# Patient Record
Sex: Female | Born: 1960 | Race: White | Hispanic: No | Marital: Married
Health system: Midwestern US, Academic
[De-identification: ages and names within clinical notes are randomized; demographics above are authoritative.]

---

## 2016-08-27 ENCOUNTER — Encounter: Admit: 2016-08-27 | Discharge: 2016-08-27 | Payer: BC Managed Care – HMO

## 2016-08-27 NOTE — Telephone Encounter
Patient called and LVM for call back.  I called her back and she asked about the need to come in for her update appointment. I advised it was just for her yearly update.  She had no other questions.

## 2016-08-31 ENCOUNTER — Encounter: Admit: 2016-08-31 | Discharge: 2016-08-31 | Payer: BC Managed Care – HMO

## 2016-09-03 ENCOUNTER — Encounter: Admit: 2016-09-03 | Discharge: 2016-09-03 | Payer: BC Managed Care – HMO

## 2016-09-03 NOTE — Telephone Encounter
Patient called and LVM for call back.  Called patient back and she asked advised that she had some Living donors and I advised of the LD number for them to call.

## 2016-09-09 ENCOUNTER — Encounter: Admit: 2016-09-09 | Discharge: 2016-09-09

## 2016-09-09 NOTE — Telephone Encounter
Patient called and LVM for call back.  Patient advised her son could not be a living donor and she was wanting to know if she was still on the list. I advised she was and that did not have any bearing on living donation. She had no other questions.

## 2016-10-28 ENCOUNTER — Ambulatory Visit: Admit: 2016-10-28 | Discharge: 2016-10-28 | Payer: BC Managed Care – HMO

## 2016-10-28 ENCOUNTER — Encounter: Admit: 2016-10-28 | Discharge: 2016-10-28 | Payer: BC Managed Care – HMO

## 2016-10-28 ENCOUNTER — Ambulatory Visit: Admit: 2016-10-28 | Discharge: 2016-10-28

## 2016-10-28 DIAGNOSIS — I10 Essential (primary) hypertension: Principal | ICD-10-CM

## 2016-10-28 DIAGNOSIS — N186 End stage renal disease: Principal | ICD-10-CM

## 2016-10-28 DIAGNOSIS — Z01818 Encounter for other preprocedural examination: ICD-10-CM

## 2016-10-28 DIAGNOSIS — E1129 Type 2 diabetes mellitus with other diabetic kidney complication: Secondary | ICD-10-CM

## 2016-10-28 DIAGNOSIS — E785 Hyperlipidemia, unspecified: ICD-10-CM

## 2016-10-28 DIAGNOSIS — Z114 Encounter for screening for human immunodeficiency virus [HIV]: ICD-10-CM

## 2016-10-28 DIAGNOSIS — R9439 Abnormal result of other cardiovascular function study: ICD-10-CM

## 2016-10-28 LAB — URINALYSIS DIPSTICK
Lab: NEGATIVE
Lab: NEGATIVE

## 2016-10-28 LAB — HEPATITIS C ANTIBODY W REFLEX HCV PCR QUANT: Lab: NEGATIVE U/L (ref 25–110)

## 2016-10-28 LAB — EPSTEIN BARR  PANEL(EBV)
Lab: NEGATIVE mg/dL — ABNORMAL HIGH (ref 0.4–1.00)
Lab: POSITIVE MMOL/L (ref 98–110)
Lab: POSITIVE mg/dL (ref 70–100)

## 2016-10-28 LAB — HERPES SIMPLEX IGG AB (HSV IGG)
Lab: NEGATIVE mL/min — ABNORMAL LOW
Lab: POSITIVE mL/min — AB

## 2016-10-28 LAB — CBC AND DIFF
Lab: 3.3 M/UL — ABNORMAL LOW (ref 4.0–5.0)
Lab: 34 % — ABNORMAL LOW (ref 36–45)
Lab: 35 pg — ABNORMAL HIGH (ref 26–34)
Lab: 5.4 10*3/uL (ref 4.5–11.0)

## 2016-10-28 LAB — HEPATITIS B SURFACE AG: Lab: NEGATIVE mg/dL (ref 0.3–1.2)

## 2016-10-28 LAB — COMPREHENSIVE METABOLIC PANEL
Lab: 140 MMOL/L (ref 137–147)
Lab: 15 U/L (ref 7–40)
Lab: 26 MMOL/L (ref 21–30)
Lab: 4.1 MMOL/L (ref 3.5–5.1)
Lab: 4.6 g/dL (ref 3.5–5.0)
Lab: 7.7 g/dL (ref 6.0–8.0)
Lab: 14 — ABNORMAL HIGH (ref 3–12)

## 2016-10-28 LAB — COCAINE-URINE RANDOM: Lab: NEGATIVE

## 2016-10-28 LAB — AMPHETAMINES-URINE RANDOM: Lab: NEGATIVE

## 2016-10-28 LAB — SYPHILIS AB SCREEN: Lab: NEGATIVE

## 2016-10-28 LAB — HIV 1& 2 AG-AB SCRN W REFLEX HIV 1 PCR QUANT: Lab: NEGATIVE U/L (ref 7–56)

## 2016-10-28 LAB — BARBITURATES-URINE RANDOM: Lab: NEGATIVE

## 2016-10-28 LAB — URINALYSIS, MICROSCOPIC

## 2016-10-28 LAB — HEPATITIS B CORE AB TOT (IGG+IGM): Lab: NEGATIVE mg/dL — ABNORMAL HIGH (ref 7–25)

## 2016-10-28 LAB — PHOSPHORUS: Lab: 5.8 mg/dL — ABNORMAL HIGH (ref 2.0–4.0)

## 2016-10-28 LAB — CMV AB IGG: Lab: POSITIVE g/dL — ABNORMAL LOW (ref 12.0–15.0)

## 2016-10-28 LAB — PROTIME INR (PT): Lab: 1 (ref 0.8–1.2)

## 2016-10-28 LAB — CMV AB IGM: Lab: NEGATIVE FL — ABNORMAL HIGH (ref 80–100)

## 2016-10-28 LAB — PHENCYCLIDINES-URINE RANDOM: Lab: NEGATIVE

## 2016-10-28 LAB — OPIATES-URINE RANDOM: Lab: NEGATIVE

## 2016-10-28 LAB — BENZODIAZEPINES-URINE RANDOM: Lab: NEGATIVE

## 2016-10-28 LAB — HEPATITIS B SURFACE AB

## 2016-10-28 LAB — CANNABINOIDS-URINE RANDOM: Lab: NEGATIVE

## 2016-10-28 LAB — HEMOGLOBIN A1C: Lab: 5.4 % (ref 4.0–6.0)

## 2016-10-28 NOTE — Telephone Encounter
Financial Transplant Evaluation     Magana met with [employer] for insurance consult. There appear to be No coverage/insurance barriers to transplant with BCBS OF [place] plan. Analaura  expressed No insurance concerns related to cost of post-transplant care and medications and verbalized understanding of Transplant Finance Coordinator's discussion and documents presented. Advised patient to call me if any insurance changes. Provided patient with my contact information. Patient verbalized understanding. Financial Assessment Update completed with patient. Per Olin Hauser no changes since the last financial assessment. Financial Assessment remains approved.NO Authorization required prior to listing.    Documents presented:  Signed Patient Liability Form, Medication Cost Estimate

## 2016-10-28 NOTE — Progress Notes
Renal Transplant Nutrition Evaluation      Impressions: I see no nutritional contraindications to transplantation at this time. Pt has an appropriate body habitus for RTx. Body mass index is 29.16 kg/m.  Estimated body mass index is 29.16 kg/m as calculated from the following:    Height as of this encounter: 170.2 cm (67").    Weight as of this encounter: 84.5 kg (186 lb 3.2 oz).                  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 Pt reports she goes to dialysis TTS at 5am. Patient reports a stable weight with no recent wt loss. Per diet recall she eats eggs, potatoes and cheese breakfast bowl with 7-up and coffee for breakfast, lunch was a couple ham sandwiches with chips water and either 7-up or coke. Dinner was 3 chicken strips with gravy, [place] mac and cheese and a coke. She is limited to 4 cups of liquid a day, takes tums as her phosphorus binder, last phos was 5.8. 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 She has appropriate body habitus, evenly distributed.      Instructed on post-transplant nutrition related expectations. Discussed importance of hand hygiene, food safety, grapefruit avoidance.    Goals: Renal-Dialysis Diet    MD referral received on 10/28/2016 for medical nutrition therapy services.    Hermina Barters, [place], [place], [place] *[phone]

## 2016-10-28 NOTE — Progress Notes
TRANSPLANT SURGERY CLINIC  PRE-TRANSPLANT EVALUATION    ATTENDING:   Dr. Porfirio Oar, MD    Date of Service: 10/28/2016    Michele Benitez is a 56 y.o. 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     Subjective:               History of Present Illness Michele Benitez is a 56 year old woman with end-stage renal disease secondary to hypertension.  Michele Benitez has previously been evaluated here at the [facility] as a candidate for renal transplantation.  She has not been listed actively because all of her imaging results have not been collected and evaluated.  Michele Benitez is doing quite well at home.  She has had no recent hospitalizations.  She has a strong functional status.  She can climb multiple flights of stairs and walk multiple city blocks without any shortness of breath, chest pain, claudication, or undue fatigue.  She continues to make a small volume of urine.  She is tolerating a full volume diet and moving her bowels on a regular basis.  She has suffered no recent fevers.  ???       Review of Systems   Constitutional: Positive for fatigue. Negative for activity change, appetite change, chills, diaphoresis, fever and unexpected weight change.   HENT: Negative.    Eyes: Negative.    Respiratory: Negative.    Cardiovascular: Negative.    Gastrointestinal: Negative.    Endocrine: Negative.    Genitourinary: Negative.    Musculoskeletal: Negative.    Skin: Negative.        Past Medical History:   Diagnosis Date   ??? Abnormal stress test 12/03/2015   ??? Dyslipidemia 07/18/2014   ??? ESRD (end stage renal disease) (HCC) 12/03/2015   ??? Hyperphosphatemia 07/18/2014   ??? Hypertension 07/18/2014     Past Surgical History:   Procedure Laterality Date   ??? PERCUTANEOUS CORONARY INTERVENTION N/A 12/03/2015    Possible Percutaneous Coronary Intervention performed by Harley Alto, MD at CATH LAB       Objective:         ??? acetaminophen (TYLENOL) 500 mg tablet Take 500 mg by mouth every 6 hours as needed for Pain. Max of 4,000 mg of acetaminophen in 24 hours.   ??? aspirin EC 81 mg tablet Take 81 mg by mouth daily. Take with food.   ??? carvedilol (COREG) 25 mg tablet Take 25 mg by mouth twice daily with meals.   ??? cholecalciferol (VITAMIN D-3) 1,000 units tablet Take 2,000 Units by mouth daily.   ??? estradiol (ESTRACE) 0.5 mg tablet Take 0.5 mg by mouth daily.   ??? NIFEdipine SR (PROCARDIA-XL; ADALAT CC) 30 mg tablet Take 30 mg by mouth daily as needed (SBP >150/90).   ??? ranitidine (ZANTAC) 75 mg tablet Take 75 mg by mouth daily as needed for Heartburn.   ??? simvastatin (ZOCOR) 20 mg tablet Take 20 mg by mouth at bedtime daily.     ALLERGIES TO MEDICATIONS  No Known Allergies       Family History   Problem Relation Age of Onset   ??? Heart Failure Mother    ??? Alcohol abuse Mother    ??? Cancer Father         colon       There were no vitals filed for this visit.  There is no height or weight on file to calculate BMI.     Labs and Diagnostic Tests:  Urinalysis:  Lab Results   Component Value Date/Time    UCOLOR YELLOW 10/29/2015 11:20 AM    TURBID CLEAR 10/29/2015 11:20 AM    USPGR 1.012 12/03/2015 01:08 PM    USPGR 1.014 10/29/2015 11:20 AM    UPH 9.0 (H) 10/29/2015 11:20 AM    UPROTEIN 3+ (A) 10/29/2015 11:20 AM    UAGLU NEG 10/29/2015 11:20 AM    UKET NEG 10/29/2015 11:20 AM    UBILE NEG 10/29/2015 11:20 AM    UBLD NEG 10/29/2015 11:20 AM    UROB NORMAL 10/29/2015 11:20 AM       Viral Serologies:  Viral Serologies Latest Ref Rng & Units 10/29/2015 11/21/2013   CMV IgG - POS POS   CMV IgM - NEG NEG   EBV Capsid IgG - POS POS   EBV Capsid IgM - NEG NEG   EBV Nuclear AG, AB - POS POS   EBV Early AG,AB - POS POS   HSV Type 2 IgG NEG-NEG NEG NEG   HIV 1 & 2 AG, AB NEG-NEG NEG NEG       CBC with Diff:  CBC with Diff Latest Ref Rng & Units 12/03/2015 11/20/2015   WBC 4.5 - 11.0 K/UL 7.8 5.6   RBC 4.0 - 5.0 M/UL 3.00(L) 3.65(L)   HGB 12.0 - 15.0 GM/DL 10.4(L) 12.2   HCT 36 - 45 % 30.7(L) 37.5   MCV 80 - 100 FL 102.2(H) - MCH 26 - 34 PG 34.5(H) -   MCHC 32.0 - 36.0 G/DL 45.4 -   RDW 11 - 15 % 12.5 -   PLT 150 - 400 K/UL 139(L) 163   MPV 7 - 11 FL 7.4 -   NEUT 41 - 77 % - -   ANC 1.8 - 7.0 K/UL - -   LYMA 24 - 44 % - -   ALYM 1.0 - 4.8 K/UL - -   MONA 4 - 12 % - -   AMONO 0 - 0.80 K/UL - -   EOSA 0 - 5 % - -   AEOS 0 - 0.45 K/UL - -   BASA 0 - 2 % - -   ABAS 0 - 0.20 K/UL - -       CMP:  CMP Latest Ref Rng & Units 12/03/2015 11/20/2015   NA 137 - 147 MMOL/L 142 143   K 3.5 - 5.1 MMOL/L 4.6 4.0   CL 98 - 110 MMOL/L 111(H) 101   CO2 21 - 30 MMOL/L 19(L) 29   GAP 3 - 12 12 -   BUN 7 - 25 MG/DL 09(W) 24   CR 0.4 - 1.19 MG/DL 1.47(W) 2.95(A)   GLUX 70 - 100 MG/DL 90 95   CA 8.5 - 21.3 MG/DL 9.1 9.3   TP 6.0 - 8.0 G/DL - -   ALB 3.5 - 5.0 G/DL - -   ALKP 25 - 086 U/L - -   ALT 7 - 56 U/L - -   TBILI 0.3 - 1.2 MG/DL - -   GFR >57 mL/min 7(L) 9.0   GFRAA >60 mL/min 8(L) 10.4       Other Common Labs:  Other Common Labs Latest Ref Rng & Units 10/29/2015 11/21/2013   PO4 2.0 - 4.0 MG/DL 3.7 8.4(O)   MG 1.6 - 2.6 MG/DL - 2.3   UPRO NEG-NEG 3+(A) 2+(A)   URICA 2.0 - 7.0 MG/DL - 7.0   NGE9B 4.0 - 6.0 % - 5.2  Imaging:  Results for orders placed during the hospital encounter of 10/29/15   CT ABD/PELV WO CONTRAST    Impression 1. Minimal aortoiliac calcific plaque.  2. Progression of bilateral renal atrophy and cortical scarring. No hydronephrosis.         Approved by Marcelline Mates, M.D. on 10/29/2015 2:00 PM    By my electronic signature, I attest that I have personally reviewed the images for this examination and formulated the interpretations and opinions expressed in this report       Finalized by Caro Hight, M.D. on 10/29/2015 4:53 PM. Dictated by Marcelline Mates, M.D. on 10/29/2015 12:59 PM.       Results for orders placed during the hospital encounter of 11/21/13   CHEST 2 VIEWS    Impression CHEST 2 VIEWS                   IMPRESSION:    1. NO ACUTE CARDIOPULMONARY ABNORMALITY. Electronically signed on: Nov 21 2013  2:48PM by Marily Memos M.D.             Physical Exam   Constitutional: She is oriented to person, place, and time. No distress.   Cardiovascular: Normal rate, regular rhythm, normal heart sounds and intact distal pulses.  Exam reveals no gallop and no friction rub.    No murmur heard.  2+ femoral pulses bilaterally.    Pulmonary/Chest: Effort normal and breath sounds normal. No respiratory distress. She has no wheezes. She has no rales. She exhibits no tenderness.   Abdominal: Soft. She exhibits no distension and no mass. There is no tenderness. No hernia.   Obese abdomen with moderate sized panus.    Musculoskeletal: She exhibits no edema, tenderness or deformity.   Neurological: She is alert and oriented to person, place, and time.   Skin: Skin is warm and dry. No rash noted. She is not diaphoretic. No erythema. No pallor.   Psychiatric: She has a normal mood and affect. Her behavior is normal. Thought content normal.        Assessment and Plan:    Michele Benitez is a 56 y.o. year old woman with renal disease secondary to hypertension. Michele Benitez remains a reasonable candidate for renal transplantation.  The patient does have a moderate sized panus.  I outlined that the patient's panus does not represent an essential barrier to transplantation but that it will increase the chances that the patient will suffer from rejection and wound complications including; wound infection, seroma, and hernia.  I specifically asked the patient to pursue weight loss.  We discussed specific strategies for doing so, including modifying diet and initiating an exercise regimen.     Given that the patient is already listed for renal transplantation, we had a somewhat limited discussion regarding renal transplantation.  We discussed the kidney allocation system and how it is based upon waiting time, starting from the date dialysis is initiated.  We spoke about the sources for renal allografts, including living and deceased donor allografts.  The patient does not have a potential living renal allograft donor.   We specifically spoke about the kidney transplant procedure.    We spoke about the recovery process as well as the possibility for post-operative complications including; rejection, arterial stenosis, renal vein thrombosis, ureteral complications, lymphocele, and wound complications.  We talked about the need for and the potential complications of immunosuppression, including infection and the modest increase risk of cancer due to immunosuppression.  Porfirio Oar, MD  Transplant Surgery

## 2016-10-28 NOTE — Progress Notes
Financial Transplant Evaluation    Michele Benitez met with Michele Benitez for insurance consult. There appear to be No coverage/insurance barriers to transplant with BCBS OF [place] plan. Michele Benitez  expressed No insurance concerns related to cost of post-transplant care and medications and verbalized understanding of Transplant Finance Coordinator's discussion and documents presented. Advised patient to call me if any insurance changes. Provided patient with my contact information. Patient verbalized understanding. Financial Assessment Update completed with patient. Per Curtis no changes since the last financial assessment. Financial Assessment remains approved.NO Authorization required prior to listing.    Documents presented:  Signed Patient Liability Form, Medication Cost Estimate

## 2016-10-28 NOTE — Progress Notes
10/28/16        Michele Benitez  [id number]  01-08-1961    Michele Benitez  [address]  Michele Benitez  Michele Benitez [address]  Phone: [phone]  Fax: [phone]    Dear Dr. Serena Benitez      We had the pleasure of meeting [name] in the [facility] for her annual waitlist appointment. She is a 55 yo white female with ESRD due to HTN. She has been on dialysis since August 28, 2014. She has been listed here since 03/18/2016. She is of the O blood group. Her last PRA was 08/04/2016 was negative for class I and positive for class II antibodies.    Since her last office visit on 10/29/2015, she had a new fistula placed in LUE on 11/29/2015. She had ligation of RUE AV fistula on 04/20/2016. No other hospitalizations nor infections.     CAD: none  CVA: none  Cancer: none   Liver disease: none  Sleep Apnea: none  Clotting disorders: none  PVD: none  Diabetes: none  Anticoagulation: none  Blood transfusions: none      PMHx:  HPL  HTN      PSHx:  RUE wrist fistula  LUE AV fistula      Comprehensive 14-point ROS obtained:  GI: no diarrhea  GU: 1-2 cups of urine per day    MEDS:  Asa  Coreg  Nifedipine  Estrace  Vitamin D  Tums with meals  Zantac prn  Zocor  Some other pill twice a day     Allergies:   No Known Allergies      SHx:  Marital status: married  Children: 2 children  Smoking: quit 69 yo; ex-smoker  Blood transfusions: none previously    FHx:  Cancer: no breast cancer; no colon cancer  Diabetes: grandfather; brother    Physical Exam:    BP 140/75 (BP Source: Arm, Right Upper, Patient Position: Standing)  - Pulse 78  - Temp 36.7 ???C (98.1 ???F) (Oral)  - Ht 170.2 cm (67)  - Wt 84.5 kg (186 lb 3.2 oz)  - SpO2 97%  - BMI 29.16 kg/m???   General: in NAD  Skin: warm, dry  HEENT: glasses; EOMI; fair dentition; missing teeth  Neck: no carotid bruits  CV: RRR S1/S2, no murmurs  Lungs: clear posteriorly  Abd: mildly obese   Ext: no edema; LUE fistula with bruit and thrill; RUE fistula ligated  Neuro: nonfocal Psych: affect appropriate    Laboratory studies: Pending    Urinalysis:  Lab Results   Component Value Date/Time    UCOLOR YELLOW 10/29/2015 11:20 AM    TURBID CLEAR 10/29/2015 11:20 AM    USPGR 1.012 12/03/2015 01:08 PM    USPGR 1.014 10/29/2015 11:20 AM    UPH 9.0 (H) 10/29/2015 11:20 AM    UPROTEIN 3+ (A) 10/29/2015 11:20 AM    UAGLU NEG 10/29/2015 11:20 AM    UKET NEG 10/29/2015 11:20 AM    UBILE NEG 10/29/2015 11:20 AM    UBLD NEG 10/29/2015 11:20 AM    UROB NORMAL 10/29/2015 11:20 AM       Viral Serologies:  Viral Serologies Latest Ref Rng & Units 10/29/2015 11/21/2013   CMV IgG - POS POS   CMV IgM - NEG NEG   EBV Capsid IgG - POS POS   EBV Capsid IgM - NEG NEG   EBV Nuclear AG, AB - POS POS   EBV Early  AG,AB - POS POS   HSV Type 2 IgG NEG-NEG NEG NEG   HIV 1 & 2 AG, AB NEG-NEG NEG NEG       CBC with Diff:  CBC with Diff Latest Ref Rng & Units 12/03/2015 11/20/2015   WBC 4.5 - 11.0 K/UL 7.8 5.6   RBC 4.0 - 5.0 M/UL 3.00(L) 3.65(L)   HGB 12.0 - 15.0 GM/DL 10.4(L) 12.2   HCT 36 - 45 % 30.7(L) 37.5   MCV 80 - 100 FL 102.2(H) -   MCH 26 - 34 PG 34.5(H) -   MCHC 32.0 - 36.0 G/DL 27.2 -   RDW 11 - 15 % 12.5 -   PLT 150 - 400 K/UL 139(L) 163   MPV 7 - 11 FL 7.4 -   NEUT 41 - 77 % - -   ANC 1.8 - 7.0 K/UL - -   LYMA 24 - 44 % - -   ALYM 1.0 - 4.8 K/UL - -   MONA 4 - 12 % - -   AMONO 0 - 0.80 K/UL - -   EOSA 0 - 5 % - -   AEOS 0 - 0.45 K/UL - -   BASA 0 - 2 % - -   ABAS 0 - 0.20 K/UL - -       CMP:  CMP Latest Ref Rng & Units 12/03/2015 11/20/2015   NA 137 - 147 MMOL/L 142 143   K 3.5 - 5.1 MMOL/L 4.6 4.0   CL 98 - 110 MMOL/L 111(H) 101   CO2 21 - 30 MMOL/L 19(L) 29   GAP 3 - 12 12 -   BUN 7 - 25 MG/DL 53(G) 24   CR 0.4 - 6.44 MG/DL 0.34(V) 4.25(Z)   GLUX 70 - 100 MG/DL 90 95   CA 8.5 - 56.3 MG/DL 9.1 9.3   TP 6.0 - 8.0 G/DL - -   ALB 3.5 - 5.0 G/DL - -   ALKP 25 - 875 U/L - -   ALT 7 - 56 U/L - -   TBILI 0.3 - 1.2 MG/DL - -   GFR >64 mL/min 7(L) 9.0   GFRAA >60 mL/min 8(L) 10.4       Other Common Labs: Other Common Labs Latest Ref Rng & Units 10/29/2015 11/21/2013   PO4 2.0 - 4.0 MG/DL 3.7 3.3(I)   MG 1.6 - 2.6 MG/DL - 2.3   UPRO NEG-NEG 3+(A) 2+(A)   URICA 2.0 - 7.0 MG/DL - 7.0   [id number] 4.0 - 6.0 % - 5.2       Patient Active Problem List    Diagnosis Date Noted   ??? ESRD (end stage renal disease) (HCC) 12/03/2015   ??? Abnormal stress test 12/03/2015   ??? Dialysis patient (HCC) 11/19/2015   ??? Pre-transplant evaluation for ESRD (end stage renal disease) 07/19/2014   ??? Chronic kidney disease 07/18/2014   ??? Hypertension 07/18/2014   ??? Dyslipidemia 07/18/2014   ??? Hyperphosphatemia 07/18/2014       Imaging:  Results for orders placed during the hospital encounter of 10/29/15   CT ABD/PELV WO CONTRAST    Impression 1. Minimal aortoiliac calcific plaque.  2. Progression of bilateral renal atrophy and cortical scarring. No hydronephrosis.         Approved by Marcelline Mates, M.D. on 10/29/2015 2:00 PM    By my electronic signature, I attest that I have personally reviewed the images for this examination and formulated the interpretations and  opinions expressed in this report       Finalized by Caro Hight, M.D. on 10/29/2015 4:53 PM. Dictated by Marcelline Mates, M.D. on 10/29/2015 12:59 PM.       Results for orders placed during the hospital encounter of 11/21/13   CHEST 2 VIEWS    Impression CHEST 2 VIEWS                   IMPRESSION:    1. NO ACUTE CARDIOPULMONARY ABNORMALITY.      Electronically signed on: Nov 21 2013  2:48PM by Marily Memos M.D.           Stress test: 08/13/2014 NM stress test  Myocardial perfusion study is abnormal. Despite severe breast attenuation inferolateral ischemia appears to be present. Normal left ventricular systolic function, ejection fraction 58%. Inferolateral hypokinesis, normal wall motion and thickening in all other myocardial segments. Normal pulmonary to myocardial count ratio and normal left ventricular end diastolic volume.       Cardiac Cath (12/03/2015): 1. Left main coronary artery:  This artery is a medium-sized vessel without significant angiographic stenosis.  It divides into an LAD and a circumflex branch.  2. Left anterior descending artery:  This is a large vessel, arising normally from the left main coronary artery.  It is without significant stenosis throughout its course, though luminal irregularities are noted.  A very large 1st diagonal branch comes from the very proximal portion.  The LAD is widely patent.  The LAD does wrap around the apex to supply a significant portion of the inferoapex.  3. Left circumflex artery:  This artery is a medium-sized nondominant vessel.  It has no significant stenosis throughout its course.  It gives rise to 2 medium-sized obtuse marginal branches without significant stenosis.  4. Right coronary artery:  This is a large vessel and dominant vessel arising from the right aortic sinus.  It has mild luminal irregularities in its midportion without significant stenosis.  It gives rise to a small PLV, but a large PDA.  No significant stenosis is noted anywhere.        Assessment and Plan:    Ms. Bohart is a 56 yo white female with ESRD due to presumably HTN who presents today for her annual wait list appointment for kidney transplantation. She remains a good candidate for transplantation.  I will update her EKG today (previously normal) and if unchanged, no further testing is needed. Given her MOCA score of 13, we will send her for neurocognitive testing but since she has good support, this would not affect her status.     Living Donors: don't want to ask children    Potential barriers for Transplantation: none    Education and Consent:    The advantages of transplantation over dialysis were discussed with the patient. The advantages of living donation vs deceased donation were reviewed.  I went over triple immunosuppression and the increased risk of malignancy and infection post-transplant. Once all testing has been completed then the patient will be discussed in our Multidisciplinary Committee Selection Meeting before a final decision is made. The patient will be contacted by the pre-transplant coordinator with the final decision.   Thank you for this opportunity in allowing [place] to participate in the care of your patient.    Sincerely,    Geri Seminole, MD    CC: Steva Ready

## 2016-10-28 NOTE — Progress Notes
Dr. Abram Sander- EKG only- if unchanged, does not need further cardiac testing this year, good candidate, needs neurocog eval to clarify cog issues  Dr. Gwenlyn Saran- no concerns, does not need CT this year  Dorothea Glassman, PharmD- concerned about how she manages her BP meds, does not use pillbox  Hermina Barters, RD- no concerns  Mollie Germany, LMSW- pt receives SSI and husband works @ [employer], took out [employer] for transplant expenses, no concerns  Mayra Magana, TFA- no concerns  MOCA: 13    You were seen by members of the Pre Renal/Pancreas Transplant Team today who have recommended that you complete the following:     Lab work (today)   EKG (today)   Neurocognitive consult (someone will call you to schedule this test)   Mammogram (please schedule this with your PCP or OBGYN ASAP if it has not been completed in the last year and contact your coordinator when it has been scheduled)      Please keep your Nurse Coordinator informed of ANY changes in your health and insurance status.    We recommend that you sign up for MyChart. The activation code has been provided to you in your after visit summary for today's visit.     It was a pleasure to see you today. Thank you for partnering with [facility] for your care.     If you should have any questions or concerns, please feel free to contact us.       Livingston Diones, RN, BSN  Renal/Pancreas Transplant Coordinator  [facility]  Phone [phone], fax [phone]

## 2016-10-29 ENCOUNTER — Encounter: Admit: 2016-10-29 | Discharge: 2016-10-29 | Payer: BC Managed Care – HMO

## 2016-10-30 ENCOUNTER — Encounter: Admit: 2016-10-30 | Discharge: 2016-10-30 | Payer: BC Managed Care – HMO

## 2016-10-30 DIAGNOSIS — Z01818 Encounter for other preprocedural examination: Principal | ICD-10-CM

## 2016-10-30 NOTE — Telephone Encounter
Dr. Abram Sander reviewed her EKG and advised that she needed only a surface ECHO and could remain ACTIVE on the list.     Patient called in and I advised that she needed ECHO. She would like to have this at [facility].  She also had questions about neuro cognitive testing which I explained. Order placed for ECHO and message sent to [facility] to schedule.

## 2016-11-03 ENCOUNTER — Encounter: Admit: 2016-11-03 | Discharge: 2016-11-03 | Payer: BC Managed Care – HMO

## 2016-11-06 ENCOUNTER — Encounter: Admit: 2016-11-06 | Discharge: 2016-11-06 | Payer: BC Managed Care – HMO

## 2016-11-06 NOTE — Telephone Encounter
Georgina Pillion with [facility] called regarding setting patient up with an ECHO.  Called her back at [phone]. She advised they needed an authorization to get the ECHO. I advised that we did not do that as it was their hospital. She advised that they did not have a pre-certification department and the referring providers did them. I transferred her to Mayra, TFA to figure out what is needed.

## 2016-11-06 NOTE — Telephone Encounter
INSURANCE / AUTH     Nurse coordinator Arby Barrette spoke to me regarding an auth for ECHO.  Explained to nurse coordinator that we do not take care of ECHO auth it will be the pre-cert department. She ask me if I can explain to Georgina Pillion with [facility]. Nurse coordinator transfer phone call.    Spoke to [employer] with [facility] advisor that we do not do the authorizations for another facility and that with the insurance that patient has no authorization is needed for transplant services. Advisor to contact nurse case manger from [place] and provided her with [employer] information. MM 11/06/16

## 2016-11-13 ENCOUNTER — Encounter: Admit: 2016-11-13 | Discharge: 2016-11-13 | Payer: BC Managed Care – HMO

## 2016-11-13 NOTE — Telephone Encounter
Received monthly ABY list and noticed the last sample patient had was from 08/04/2016.  Verified this with Caryl Pina at [facility] and she advised that that was correct.  Called patient and she advised that she does not have any kits but she thought she sent one last month. Advised that I was sending them out to her but that if this was not completed ASAP. She may be made inactive. Patient verbalized understanding.

## 2016-11-16 ENCOUNTER — Ambulatory Visit: Admit: 2016-11-16 | Discharge: 2016-11-17 | Payer: BC Managed Care – HMO

## 2016-11-16 ENCOUNTER — Encounter: Admit: 2016-11-16 | Discharge: 2016-11-16 | Payer: BC Managed Care – HMO

## 2016-11-16 DIAGNOSIS — N186 End stage renal disease: Principal | ICD-10-CM

## 2016-11-16 NOTE — Telephone Encounter
I called her dialysis center and spoke with Carol Ada, RN. She advised that the June sample was in the back of the fridge and that they had another kit and would draw her tomorrow. I advised she could discard the previous sample.  I also emailed [facility] to send kits to the dialysis center as well.    Called patient and advised her of this.  Patient advised that she had her echocardiogram completed today at [facility] as well. Message sent to MA to request this tomorrow.

## 2016-11-17 ENCOUNTER — Encounter: Admit: 2016-11-17 | Discharge: 2016-11-17 | Payer: BC Managed Care – HMO

## 2016-11-17 NOTE — Telephone Encounter
Michele Benitez with [facility] LVM that they drew her ABY sample today and it was shipped out.

## 2016-12-01 ENCOUNTER — Encounter: Admit: 2016-12-01 | Discharge: 2016-12-01 | Payer: BC Managed Care – HMO

## 2016-12-09 ENCOUNTER — Encounter: Admit: 2016-12-09 | Discharge: 2016-12-09

## 2016-12-09 NOTE — Telephone Encounter
Received a VM from patient advising that she needed more ABY kits.  I called her back and advised that these were sent to her dialysis center. She verbalized understanding and had no other questions.

## 2016-12-29 ENCOUNTER — Encounter: Admit: 2016-12-29 | Discharge: 2016-12-29

## 2016-12-29 ENCOUNTER — Ambulatory Visit: Admit: 2016-12-29 | Discharge: 2016-12-30 | Payer: BC Managed Care – HMO

## 2016-12-29 DIAGNOSIS — N186 End stage renal disease: ICD-10-CM

## 2016-12-29 DIAGNOSIS — E785 Hyperlipidemia, unspecified: Secondary | ICD-10-CM

## 2016-12-29 DIAGNOSIS — R9439 Abnormal result of other cardiovascular function study: ICD-10-CM

## 2016-12-29 DIAGNOSIS — I10 Essential (primary) hypertension: Principal | ICD-10-CM

## 2016-12-29 NOTE — Progress Notes
Neuropsychological Evaluation    Name: Michele Benitez          MRN: [id number]      DOB: [birth year]      AGE: 56 y.o.   Date of Service: 12/29/2016    Referring Provider: Geri Seminole, MD    Reason for Referral: Michele Benitez is a 57 year old left-handed Caucasian woman with a history of end-stage renal disease who was referred for neuropsychological evaluation as part of a comprehensive workup for possible kidney transplant.    Chief Complaint: Cognitive Change    History: Michele Benitez and her husband reported that she was diagnosed with kidney disease approximately 3 years ago.  She has been on hemodialysis for a little more than 2 years.  She described mild cognitive changes within the past few years, with perhaps slight worsening over time.  Cognitive concerns that she reported included concentration difficulty, mildly slowed cognitive processing speed, inconsistent recall of conversations, increased tendency to misplace items, occasional difficulty with problem-solving, and mild word retrieval difficulty.  Other reported problems included arthritis pain in the low back and in several other joints.    Previous medical history is described in the medical history section below.  [place] records indicated that Michele Benitez has been listed with the kidney transplant service since December 2017 and has been on dialysis since June 2016.  She was noted to have scored 13/30 on the [facility] Cognitive Assessment at some point during the course of her medical workup.  During today's visit, Michele Benitez reported that she typically sleeps 6-8 hours per night and she reported no significant sleep problems aside from some fatigue/drowsiness immediately following dialysis treatments.  Neither she nor her husband have noticed any problems with sleep breathing or unusual movements during sleep.  She reported stable appetite and weight, with some recent intentional weight loss associated with increased activity (walking). Michele Benitez reported no history of psychiatric or psychological treatments.  She reported no significant mood or anxiety problems.  She denied suicidal thoughts.  There was no indication of increased irritability or personality changes.  She reported no history of hallucinations or paranoid thoughts.    Michele Benitez indicated that she was unsure whether she reached developmental milestones at typical ages.  She did not report any significant academic problems and she indicated that she graduated from high school.  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  She lives with her husband and has adult children who live nearby.    Michele Benitez and her husband reportedly share responsibility for managing the household finances and she did not describe any change in her ability to do this task.  She is reportedly able to manage medications independently.  She reported no problems with driving.  She is able to perform household tasks such as laundry and cleaning.  She reported no problems with grooming or dressing.  Her activities include walking, listening to music, and watching television.    Medical History:   Past Medical History:   Diagnosis Date   ??? Abnormal stress test 12/03/2015   ??? Dyslipidemia 07/18/2014   ??? ESRD (end stage renal disease) (HCC) 12/03/2015   ??? Hyperlipemia    ??? Hyperphosphatemia 07/18/2014   ??? Hypertension 07/18/2014     Surgical History:   Past Surgical History:   Procedure Laterality Date   ??? PERCUTANEOUS CORONARY INTERVENTION N/A 12/03/2015    Possible Percutaneous Coronary Intervention performed by Harley Alto, MD at [facility]  Family History:   Family History   Problem Relation Age of Onset   ??? Heart Failure Mother    ??? Alcohol abuse Mother    ??? Cancer Father         colon     Social/Developmental History:   Social History     Social History   ??? Marital status: Married     Spouse name: N/A   ??? Number of children: N/A   ??? Years of education: 29     Occupational History ??? Not on file.     Social History Main Topics   ??? Smoking status: Never Smoker   ??? Smokeless tobacco: Never Used   ??? Alcohol use No   ??? Drug use: No   ??? Sexual activity: Not on file       Relevant Studies:   Lab Results   Component Value Date/Time    NA 140 10/28/2016 12:04 PM    K 4.1 10/28/2016 12:04 PM    CL 100 10/28/2016 12:04 PM    CO2 26 10/28/2016 12:04 PM    GAP 14 (H) 10/28/2016 12:04 PM    BUN 28 (H) 10/28/2016 12:04 PM    CR 5.73 (H) 10/28/2016 12:04 PM    GLU 92 10/28/2016 12:04 PM    CA 9.8 10/28/2016 12:04 PM    PO4 5.8 (H) 10/28/2016 12:04 PM    ALBUMIN 4.6 10/28/2016 12:04 PM    TOTPROT 7.7 10/28/2016 12:04 PM    ALKPHOS 75 10/28/2016 12:04 PM    AST 15 10/28/2016 12:04 PM    ALT 13 10/28/2016 12:04 PM    TOTBILI 0.4 10/28/2016 12:04 PM    GFR 8 (L) 10/28/2016 12:04 PM    GFRAA 9 (L) 10/28/2016 12:04 PM     Lab Results   Component Value Date/Time    TSH 0.87 08/16/2015      BMI Readings from Last 1 Encounters:   11/16/16 28.66 kg/m???     No flowsheet data found.       Medications:  ??? acetaminophen (TYLENOL) 500 mg tablet Take 500 mg by mouth every 6 hours as needed for Pain. Max of 4,000 mg of acetaminophen in 24 hours.   ??? aspirin EC 81 mg tablet Take 81 mg by mouth daily. Take with food.   ??? carvedilol (COREG) 25 mg tablet Take 25 mg by mouth twice daily as needed (SBP >140).   ??? cholecalciferol (VITAMIN D-3) 1,000 units tablet Take 2,000 Units by mouth daily.   ??? estradiol (ESTRACE) 0.5 mg tablet Take 0.5 mg by mouth daily.   ??? multivit-mins 25/folic acid/D3 (DIALYVITE SUPREME D PO) Take 1 tablet by mouth daily.   ??? NIFEdipine SR (PROCARDIA-XL; ADALAT CC) 30 mg tablet Take 30 mg by mouth daily as needed (SBP >160/90). After dialysis   ??? ranitidine (ZANTAC) 75 mg tablet Take 75 mg by mouth daily as needed for Heartburn.   ??? simvastatin (ZOCOR) 20 mg tablet Take 20 mg by mouth at bedtime daily.   ??? sodium bicarbonate 650 mg tablet Take 1,300 mg by mouth twice daily. Neurobehavioral Status Examination: Michele Benitez was accompanied by her husband, who was present during the clinical interview.  She was casually dressed and adequately groomed.  She did not display any obvious movement abnormalities.  Conversational speech was normal.  Comprehension appeared grossly intact during the interview.  Thoughts were coherent and logical.  She was able to provide much of the history, though she sometimes had difficulty  giving specific details and her husband provided additional information at these times.  She did not report subjective mood disturbance and her affect varied appropriately.  During testing, she required repetition and/or clarification of test instructions at times.  During one task (Trail Making Part B), it appeared that she did not fully comprehend task instructions even after she was given additional explanation and this task was discontinued.  Another task that would normally be a part of the test battery (Modified [employer]) was not administered due to her difficulty with Trail Making and other tasks.  She was polite and cooperative with the evaluation.    Test Procedures:  For a listing of all tests administered, please see the appended table. Note that test results should be interpreted by a neuropsychologist and that individual test scores should be interpreted only in the context of other findings and history.    Test Results: Performance validity findings were equivocal, with some indicators falling below the usual cutoff scores.  In the context of the evaluation,  which I would expect to favor adequate effort and the possibility of long-standing cognitive weaknesses, I suspect that the validity findings were indicative of genuine cognitive deficits rather than suboptimal effort/task engagement and I considered test scores to be a reasonable indication of Ms. Krolick' abilities.  A reading based estimate of her previous level of intellectual functioning was within the average range, though this was likely an overestimate, as her performance on a brief intellectual screening suggested intellectual functioning below the normal range.  Her performance across other neuropsychological tests was largely consistent with the intellectual estimate, with a relative strength in selective attention and copying of a complex geometric figure, but with most other scores falling below the normal range.  Ms. Birkle  endorsed minimal symptoms on inventories of depression and anxiety.    Impression: Neuropsychological findings were consistent with relatively global cognitive deficits.  Although these findings may have reflected some decline from Ms. Westenhaver' previous functioning based on her report of increased difficulties in the past few years, I suspect that they reflected long-standing cognitive weaknesses to a significant degree.  To the extent that there has been cognitive decline, it is most likely due to cognitive effects of end-stage renal disease superimposed on long-standing cognitive weaknesses. I could not entirely exclude the possibility of some other underlying neurological process, but I would consider this possibility less likely at this point.  I would encourage monitoring of her course over time to rule out any progressive cognitive decline.  There did not appear to be any evidence of significant psychological distress.    Recommendations:  1) I encouraged Ms. Rahe to follow up with the kidney transplant team to discuss the neuropsychological findings in the context of the broader medical workup.  2) Ms. Zich appeared to have a basic understanding of her medical conditions; however, given the evidence of significant cognitive deficits, I would encourage healthcare providers to communicate with her using simple, concrete language and to involve family members in discussions of her care whenever possible.  3) individuals with similar neuropsychological profiles often experience significant difficulty managing complex activities of daily living including medications.  Although Ms. Detore did not describe problems with this task, she would benefit from having a family member or other trusted individual supervise her medications.  4) If there is concern for the possibility of progressive cognitive decline in the future, neuropsychological re-evaluation with comparison to the current findings could be helpful in determining the  course of cognitive difficulties.  5) I provided initial feedback to Ms. Vroman and her husband at the conclusion of the evaluation, but would be happy to find time to discuss the findings further if desired.  We can be reached at [phone].      Hoyle Barr, PhD, ABPP/CN    Time Documentation: total time for this evaluation included 105 minutes of psychometrist time in face-to-face test administration, 14 minutes of computerized testing, 34 minutes of neuropsychologist time in interview/neurobehavioral status examination, and 95 minutes of neuropsychologist time in face-to-face testing, feedback, and/or comprehensive report writing/integration of test findings, neurobehavioral examination findings, history, and available records.    Test Results Table:     Estimated Premorbid Ability: Raw Score Std Score Percentile Score Range     Hopkins Adult Reading Test 13 95 37 Average          Intellectual Ability:         Wechsler Abbreviated Scale of Intelligence-2  T Score         Vocabulary 18 27 1  Abnormal       Matrix Reasoning 7 27 1  Abnormal     Std Score         2-Subtest Estimated Intellectual Quotient -- 62 1 Extremely Low          Attention:  T Score       Digit Span-Longest Forward Span 4 28 1  Mildly Abnormal     Digit Span-Longest Backward Span 3 31 3  Borderline     Digit Span Total 7 22 <1 Moderately Abnormal     T Score Brief Test of Attention-Numbers 5 36 8 Borderline     Brief Test of Attention-Letters 6 40 16 Low Average     Brief Test of Attention Total 11 37 10 Low Average          Visual Memory:         Brief Visuospatial Memory Test-Revised  T Score         Total Recall (Trials 1-3) 6 21 <1 Moderately Abnormal       Delayed Recall 1 19 <1 Extremely Abnormal       Percent Retention 50.00 30 2 Borderline       Recognition Discrimination Index 2 19 <1 Extremely Abnormal          Verbal Memory:         Hopkins Verbal Learning Test-Revised  T Score         Total Recall (Trials 1-3) 8 19 <1 Extremely Abnormal       Delayed Recall 0 21 <1 Moderately Abnormal       Percent Retention 0 25 1 Mildly Abnormal       Recognition Discrimination Index 7 30 2  Borderline            Wechsler Memory Scale-IV  Scaled Score         Logical Memory I 3 1 <1 Abnormal       Logical Memory II 4 2 <1 Abnormal     Cum. %         Logical Memory Recognition 15 ?2  Abnormal          Language:  T Score       Boston Naming Test-30 item version 19 19 <1 Extremely Abnormal            Calibrated Ideational Fluency  T Score         Letter Word Fluency 6 19 <1  Extremely Abnormal       Category Word Fluency 32 33 4 Borderline       Verbal Fluency Total 38 26 1 Mildly Abnormal          Executive Function:         Trail Making Test  T Score         Part A 48 36 8 Borderline       Part B Discont???d -- -- --            Stroop Color Word Test  T Score         Word Trial 71 34-36 5-8 Borderline       Color Trial 15 <20 <1 Abnormal       Color-Word Trial 21 30-32 2-4 Borderline          Perceptual Function:  T Score       Rey Complex Figure Test-Copy 28.5 39 14 Low Average     Rey Complex Figure Test-Time 407 28 1 Mildly Abnormal          Motor Function:         Grooved Pegboard Test  T Score          Dominant Hand (1 trial) 122 23 <1 Moderately Abnormal        Nondominant Hand (1 trial) 116 34 5 Borderline          Emotional Function: State-Trait Anxiety Inventory  T Score         State Anxiety 31 51  Normal       Trait Anxiety 32 56  Normal            GDS-15 1   Normal          Other Measures:         Medical Symptom Validity Test    Equivocal     Reliable Digit Span    Equivocal     Note:   ranges used to derive descriptors from [employer] Neuropsychological Normative System manual (Schretlen, [place], & Purvis, 2010); CNNS scores calibrated for age, sex, & education unless otherwise noted. Scores from other tests are not necessarily calibrated for all these variables.    Classification Qualitative Label T Score Range   Normal Extremely Superior ?77    Very Superior 70-76    Superior 64-69    High Average 57-63    Average 44-56    Low Average 37-43   Borderline Borderline 30-36   Abnormal Mildly Abnormal 25-29    Moderately Abnormal 20-24    Extremely Abnormal ?19

## 2016-12-29 NOTE — Progress Notes
Total psychometrist face to face time with patient was 105 minutes. The computerized testing time was 14 minutes.

## 2016-12-30 DIAGNOSIS — R4189 Other symptoms and signs involving cognitive functions and awareness: Principal | ICD-10-CM

## 2016-12-30 DIAGNOSIS — Z01818 Encounter for other preprocedural examination: ICD-10-CM

## 2017-01-12 ENCOUNTER — Encounter: Admit: 2017-01-12 | Discharge: 2017-01-12 | Payer: BC Managed Care – HMO

## 2017-03-03 ENCOUNTER — Encounter: Admit: 2017-03-03 | Discharge: 2017-03-03 | Payer: BC Managed Care – HMO

## 2017-03-03 NOTE — Telephone Encounter
Request from [facility] to contact patient and remind her that her mammogram will be due on 03/18/17  Notified patient, she voiced understanding, she plans to call her PCP Dr. Earvin Hansen to get mammo scheduled.  Asked patient to call our office and let her nurse know when she is scheduled.

## 2017-03-18 ENCOUNTER — Encounter: Admit: 2017-03-18 | Discharge: 2017-03-18

## 2017-03-18 NOTE — Telephone Encounter
Spoke with patient to see if she did her mammogram, pt. said she is going tomorrow at the [facility].

## 2017-03-19 ENCOUNTER — Encounter: Admit: 2017-03-19 | Discharge: 2017-03-20 | Payer: BC Managed Care – HMO

## 2017-03-19 ENCOUNTER — Encounter: Admit: 2017-03-19 | Discharge: 2017-03-19

## 2017-03-19 DIAGNOSIS — R69 Illness, unspecified: Principal | ICD-10-CM

## 2017-03-19 NOTE — Telephone Encounter
Patient called and LVM that she had her mammogram done.  Called her back and she had her mammogram at [facility] to request this.

## 2017-04-07 ENCOUNTER — Encounter: Admit: 2017-04-07 | Discharge: 2017-04-07 | Payer: BC Managed Care – HMO

## 2017-04-07 DIAGNOSIS — R69 Illness, unspecified: Principal | ICD-10-CM

## 2017-04-13 ENCOUNTER — Encounter: Admit: 2017-04-13 | Discharge: 2017-04-13 | Payer: BC Managed Care – HMO

## 2017-04-30 ENCOUNTER — Encounter: Admit: 2017-04-30 | Discharge: 2017-04-30 | Payer: BC Managed Care – HMO

## 2017-06-24 ENCOUNTER — Encounter: Admit: 2017-06-24 | Discharge: 2017-06-24 | Payer: BC Managed Care – HMO

## 2017-06-24 DIAGNOSIS — E785 Hyperlipidemia, unspecified: ICD-10-CM

## 2017-06-24 DIAGNOSIS — I10 Essential (primary) hypertension: Principal | ICD-10-CM

## 2017-06-24 DIAGNOSIS — N186 End stage renal disease: ICD-10-CM

## 2017-06-24 DIAGNOSIS — R9439 Abnormal result of other cardiovascular function study: ICD-10-CM

## 2017-07-20 ENCOUNTER — Encounter: Admit: 2017-07-20 | Discharge: 2017-07-20 | Payer: BC Managed Care – HMO

## 2017-07-20 ENCOUNTER — Ambulatory Visit: Admit: 2017-07-20 | Discharge: 2017-07-21 | Payer: BC Managed Care – HMO

## 2017-07-21 DIAGNOSIS — N186 End stage renal disease: Principal | ICD-10-CM

## 2017-07-28 ENCOUNTER — Encounter: Admit: 2017-07-28 | Discharge: 2017-07-28

## 2017-08-30 ENCOUNTER — Encounter: Admit: 2017-08-30 | Discharge: 2017-08-30

## 2017-09-02 ENCOUNTER — Encounter: Admit: 2017-09-02 | Discharge: 2017-09-02 | Payer: BC Managed Care – HMO

## 2017-09-02 DIAGNOSIS — E785 Hyperlipidemia, unspecified: Secondary | ICD-10-CM

## 2017-09-02 DIAGNOSIS — I10 Essential (primary) hypertension: Principal | ICD-10-CM

## 2017-09-02 DIAGNOSIS — R9439 Abnormal result of other cardiovascular function study: ICD-10-CM

## 2017-09-02 DIAGNOSIS — N186 End stage renal disease: ICD-10-CM

## 2017-09-10 ENCOUNTER — Encounter: Admit: 2017-09-10 | Discharge: 2017-09-10

## 2017-10-26 ENCOUNTER — Encounter: Admit: 2017-10-26 | Discharge: 2017-10-26

## 2017-11-01 ENCOUNTER — Encounter: Admit: 2017-11-01 | Discharge: 2017-11-01 | Payer: BC Managed Care – HMO

## 2017-11-05 ENCOUNTER — Encounter: Admit: 2017-11-05 | Discharge: 2017-11-05

## 2017-11-09 ENCOUNTER — Encounter: Admit: 2017-11-09 | Discharge: 2017-11-09 | Payer: BC Managed Care – HMO

## 2017-11-10 ENCOUNTER — Ambulatory Visit: Admit: 2017-11-10 | Discharge: 2017-11-10 | Payer: BC Managed Care – HMO

## 2017-11-10 ENCOUNTER — Encounter: Admit: 2017-11-10 | Discharge: 2017-11-10 | Payer: BC Managed Care – HMO

## 2017-11-10 ENCOUNTER — Ambulatory Visit: Admit: 2017-11-10 | Discharge: 2017-11-11 | Payer: BC Managed Care – HMO

## 2017-11-10 ENCOUNTER — Encounter: Admit: 2017-11-10 | Discharge: 2017-11-10

## 2017-11-10 DIAGNOSIS — R9439 Abnormal result of other cardiovascular function study: ICD-10-CM

## 2017-11-10 DIAGNOSIS — N186 End stage renal disease: ICD-10-CM

## 2017-11-10 DIAGNOSIS — Z01818 Encounter for other preprocedural examination: Principal | ICD-10-CM

## 2017-11-10 DIAGNOSIS — I120 Hypertensive chronic kidney disease with stage 5 chronic kidney disease or end stage renal disease: ICD-10-CM

## 2017-11-10 DIAGNOSIS — E785 Hyperlipidemia, unspecified: Secondary | ICD-10-CM

## 2017-11-10 DIAGNOSIS — I10 Essential (primary) hypertension: Principal | ICD-10-CM

## 2017-11-10 LAB — SYPHILIS AB SCREEN: Lab: NEGATIVE

## 2017-11-10 LAB — HEPATITIS C ANTIBODY W REFLEX HCV PCR QUANT: Lab: NEGATIVE

## 2017-11-10 LAB — HERPES SIMPLEX IGG AB (HSV IGG)
Lab: NEGATIVE
Lab: POSITIVE — AB

## 2017-11-10 LAB — LIVER FUNCTION PANEL
Lab: 0.1 mg/dL (ref <0.4)
Lab: 0.4 mg/dL (ref 0.3–1.2)

## 2017-11-10 LAB — HIV 1& 2 AG-AB SCRN W REFLEX HIV 1 PCR QUANT: Lab: NEGATIVE

## 2017-11-10 LAB — CBC
Lab: 13 % (ref 11–15)
Lab: 3.3 M/UL — ABNORMAL LOW (ref 4.0–5.0)
Lab: 33 % — ABNORMAL LOW (ref 36–45)
Lab: 33 pg (ref 26–34)
Lab: 7.1 10*3/uL (ref 4.5–11.0)
Lab: 7.4 FL (ref 7–11)

## 2017-11-10 LAB — TOXOPLASMA IGM: Lab: NEGATIVE

## 2017-11-10 LAB — OPIATES-URINE RANDOM: Lab: NEGATIVE

## 2017-11-10 LAB — HEPATITIS B SURFACE AB: Lab: 3.9 m[IU]/mL (ref 32.0–36.0)

## 2017-11-10 LAB — HEPATITIS B SURFACE AG: Lab: NEGATIVE 10*3/uL (ref 150–400)

## 2017-11-10 LAB — CANNABINOIDS-URINE RANDOM: Lab: NEGATIVE

## 2017-11-10 LAB — GGTP: Lab: 18 U/L — ABNORMAL LOW (ref 9–64)

## 2017-11-10 LAB — HEPATITIS B CORE AB TOT (IGG+IGM): Lab: NEGATIVE FL — ABNORMAL HIGH (ref 80–100)

## 2017-11-10 LAB — PROTIME INR (PT): Lab: 1 (ref 0.8–1.2)

## 2017-11-10 LAB — PHOSPHORUS: Lab: 4.5 mg/dL (ref 2.0–4.5)

## 2017-11-10 LAB — PHENCYCLIDINES-URINE RANDOM: Lab: NEGATIVE

## 2017-11-10 LAB — TOXOPLASMA IGG: Lab: NEGATIVE

## 2017-11-10 LAB — COCAINE-URINE RANDOM: Lab: NEGATIVE

## 2017-11-10 LAB — BENZODIAZEPINES-URINE RANDOM: Lab: NEGATIVE

## 2017-11-10 LAB — AMPHETAMINES-URINE RANDOM: Lab: NEGATIVE

## 2017-11-10 LAB — BARBITURATES-URINE RANDOM: Lab: NEGATIVE

## 2017-11-17 ENCOUNTER — Ambulatory Visit: Admit: 2017-11-17 | Discharge: 2017-11-17 | Payer: BC Managed Care – HMO

## 2017-11-17 DIAGNOSIS — Z01818 Encounter for other preprocedural examination: Principal | ICD-10-CM

## 2017-12-01 ENCOUNTER — Encounter: Admit: 2017-12-01 | Discharge: 2017-12-01 | Payer: BC Managed Care – HMO

## 2017-12-06 ENCOUNTER — Encounter: Admit: 2017-12-06 | Discharge: 2017-12-06 | Payer: BC Managed Care – HMO

## 2017-12-06 DIAGNOSIS — R9439 Abnormal result of other cardiovascular function study: ICD-10-CM

## 2017-12-06 DIAGNOSIS — N186 End stage renal disease: ICD-10-CM

## 2017-12-06 DIAGNOSIS — E785 Hyperlipidemia, unspecified: ICD-10-CM

## 2017-12-06 DIAGNOSIS — I10 Essential (primary) hypertension: Principal | ICD-10-CM

## 2017-12-21 ENCOUNTER — Ambulatory Visit: Admit: 2017-12-21 | Discharge: 2017-12-22 | Payer: BC Managed Care – HMO

## 2017-12-22 DIAGNOSIS — N186 End stage renal disease: Principal | ICD-10-CM

## 2017-12-24 ENCOUNTER — Encounter: Admit: 2017-12-24 | Discharge: 2017-12-24 | Payer: BC Managed Care – HMO

## 2017-12-28 ENCOUNTER — Encounter: Admit: 2017-12-28 | Discharge: 2017-12-28 | Payer: BC Managed Care – HMO

## 2017-12-28 DIAGNOSIS — Z005 Encounter for examination of potential donor of organ and tissue: Principal | ICD-10-CM

## 2017-12-29 ENCOUNTER — Encounter: Admit: 2017-12-29 | Discharge: 2017-12-29 | Payer: BC Managed Care – HMO

## 2018-01-14 ENCOUNTER — Encounter: Admit: 2018-01-14 | Discharge: 2018-01-14 | Payer: BC Managed Care – HMO

## 2018-03-07 ENCOUNTER — Encounter: Admit: 2018-03-07 | Discharge: 2018-03-07 | Payer: BC Managed Care – HMO

## 2018-03-14 ENCOUNTER — Encounter: Admit: 2018-03-14 | Discharge: 2018-03-14 | Payer: BC Managed Care – HMO

## 2018-03-31 ENCOUNTER — Encounter: Admit: 2018-03-31 | Discharge: 2018-03-31

## 2018-04-06 ENCOUNTER — Encounter: Admit: 2018-04-06 | Discharge: 2018-04-06

## 2018-04-20 ENCOUNTER — Encounter: Admit: 2018-04-20 | Discharge: 2018-04-20

## 2018-05-03 ENCOUNTER — Ambulatory Visit: Admit: 2018-05-03 | Discharge: 2018-05-04 | Payer: BC Managed Care – HMO

## 2018-05-05 ENCOUNTER — Encounter: Admit: 2018-05-05 | Discharge: 2018-05-05 | Payer: BC Managed Care – HMO

## 2018-06-11 ENCOUNTER — Encounter: Admit: 2018-06-11 | Discharge: 2018-06-11

## 2018-06-11 DIAGNOSIS — Z005 Encounter for examination of potential donor of organ and tissue: Principal | ICD-10-CM

## 2018-06-12 ENCOUNTER — Encounter: Admit: 2018-06-12 | Discharge: 2018-06-12 | Payer: BC Managed Care – HMO

## 2018-06-12 ENCOUNTER — Ambulatory Visit: Admit: 2018-06-12 | Discharge: 2018-06-13 | Payer: MEDICARE

## 2018-06-12 NOTE — Telephone Encounter
Coordinator called patient to notify her that organs were allocated to primary recipients and that this organ offer is completed. Patient able to verbalize understanding and had no further questions.

## 2018-06-20 ENCOUNTER — Encounter: Admit: 2018-06-20 | Discharge: 2018-06-20

## 2018-06-21 ENCOUNTER — Encounter: Admit: 2018-06-21 | Discharge: 2018-06-21 | Payer: BC Managed Care – HMO

## 2018-07-04 ENCOUNTER — Encounter: Admit: 2018-07-04 | Discharge: 2018-07-04 | Payer: BC Managed Care – HMO

## 2018-07-05 ENCOUNTER — Encounter: Admit: 2018-07-05 | Discharge: 2018-07-05 | Payer: BC Managed Care – HMO

## 2018-07-05 ENCOUNTER — Encounter: Admit: 2018-07-05 | Discharge: 2018-07-05

## 2018-07-05 ENCOUNTER — Ambulatory Visit: Admit: 2018-07-05 | Discharge: 2018-07-06 | Payer: BC Managed Care – HMO

## 2018-07-05 NOTE — Telephone Encounter
Michele Benitez, TFA advised that patient needed to pick up Medicare since she was on dialysis and that her BCBS was not going to pay for transplant and patient needed to be placed on hold.  Michele Benitez advised she had spoken with patient.    Called patient and spoke with her regarding this.  She advised she felt overwhelmed with this because she had always been told that she should not pick up Medicare.  I advised to call BCBS and discuss this with them and then to also work with her SW at dialysis. She verbalized understanding.    Updated UNET, updated Transplant tab, letters completed and provided to MA.

## 2018-07-05 NOTE — Telephone Encounter
Patient called and LVM for call back that her last PAP was 4 years ago.  Called her back and and advised that usually Paps needed to be completed every 3 years and to follow up with her PCP.  She advised she will let me know when her PAP is completed.

## 2018-07-06 ENCOUNTER — Ambulatory Visit: Admit: 2018-07-06 | Discharge: 2018-07-07 | Payer: BC Managed Care – HMO

## 2018-07-12 ENCOUNTER — Encounter: Admit: 2018-07-12 | Discharge: 2018-07-12

## 2018-07-13 ENCOUNTER — Encounter: Admit: 2018-07-13 | Discharge: 2018-07-13 | Payer: BC Managed Care – HMO

## 2018-07-20 ENCOUNTER — Encounter: Admit: 2018-07-20 | Discharge: 2018-07-20 | Payer: BC Managed Care – HMO

## 2018-07-20 NOTE — Telephone Encounter
INSURANCE    NC Paige transfer me the following message:  Michele Benitez this is Michele Benitez I am calling to let you know that I have that form 2728 for Medicare and I got me an appointment on May 7th to see about getting Medicare my phone number is [phone]. Thank you.

## 2018-08-12 ENCOUNTER — Encounter: Admit: 2018-08-12 | Discharge: 2018-08-12 | Payer: BC Managed Care – HMO

## 2018-08-25 ENCOUNTER — Encounter: Admit: 2018-08-25 | Discharge: 2018-08-25

## 2018-08-25 NOTE — Telephone Encounter
Rec'd msg from TFA informing that pt's Medicare is now active and that pt has been cleared to be changed by to active status from a financial standpoint.     TC to pt to ask her if she's had her pap smear completed yet. Pt stated that she will call and schedule it this afternoon. I let her know that she can be changed back to active status once we receive the Pap results. Pt v/u of conversation.

## 2018-08-25 NOTE — Telephone Encounter
TRANSPLANT BENEFIT COLLECTION:   PATIENT IS CLEARED FOR TELEHEALTH EVALUATION         Verified by: Barbaraann Faster         Date: August 25, 2018  PRIMARY  ID #: [id number]   EFF:06/22/18 Part A - 07/22/18 Part B  Subscriber:Self   Ins Plan:Medicare A&B   Phone#:[phone]   Plan Type:Traditional  MEDICARE A&B - 2020 BENEFIT SUMMARY:  PART A: DAYS REFRESH AFTER 60 CONSECUTIVE OUTPT DAYS  DAYS 1-60 $1408 DEDUCTIBLE  DAYS 61-90 $352/DAY COPAY  DAYS 91-150 (USING ANY LIFETIME RESERVE DAYS) $704DAY COPAY  DAYS > 150 PT PORTION 100%; after a 3-day minimum medically necessary inpatient hospital stay for a related illness or injury.  PART A/ SNF BENEFITS: IN 2020: DAYS 1-20 PT PORTION $0; DAYS 21-100 $176/DAY COPAY; DAYS > 100 PT PORTION 100%  PART B / BENEFITS IN 2020:  DEDUCTIBLE $198 WITH 80% REIMBURSEMENT OF ALLOWABLE, NO OUT OF POCKET MAXIMUM  MEDICARE WILL PAY FOR DRUGS INFUSED THROUGH AN ITEM OF DME, LIKE AN INFUSION PUMP, OR DRUGS GIVEN BY A NEBULIZER WHEN GIVEN BY A LICENSED MEDICAL PROVIDER.  PART B WILL COVER IMMUNOSUPPRESSIVE DRUGS IF MC COVERED THE TRANSPLANT, EVEN AS SECONDARY PAYER  No Case Management, No transplant Network, No prior authorizations, No benefit limits  [employer] Ref.#:[id number]    PART D  RX Plan:Limitid Income Net Progam   Phone #: [phone]  Co-Pay:$3.60 - $8.95  Current Subsidy:LIS Extra Help  Valcyte:$8.95  Generic:$3.60  Spoke to [place] Ref.#:[id number]    SECONDARY  ID #:???[id number]????????????GR#: [id number] ??????EFF:03/23/16?????????Subscriber:Self?????????  Ins Plan:BCBS [place]?????????Phone#:[phone]?????????Plan Type:BlueCare EPO Silver 3  Deductible:???$300 Met           Co-ins: 20%              Out of Pocket: $600 Med/RX Met  Inpt Copay:???Sub to Co-Ins  Outpt Copay:???Sub to Co-Ins  OV PCP/Spec Copay: $20 / $40  Additional Benefit Limits: [place] In-Network  Plan limitations/concerns: spoke to [facility] 07/05/18 Ref.#:[id number]  BCBS will pay as Secondary Coverage after the 30 MO COB whether or not patient has Medicare.  ???  Donor Benefits  Max Donor Benefits: Covered  Travel and Lodging for recipient. none  Donor Travel and Lodging for Donor: none  ???  RX Plan:BCBS???????????? Phone #:???[phone]  30 day retail cost:???$15 / $50 / $75 / $150  90 day m/o cost: $37.50 / $125 / $187.50 /375  Valcyte: Not Covered  Generic: $15  Envarsus XR: $75  RX Ded: $300 Rx/Med Met  RX OOP: $600 Rx/Med Met  ???  TXP Network:BCBS  NCM: Candelaria Celeste  Phone #: [phone]???????????????FAX #:???[phone]  Auth requirements:For Kidney Only No Auth Needed for Eval & Listing.

## 2018-08-25 NOTE — Telephone Encounter
Called [facility] to let her know that per Dr. Abram Sander, she is now active on the wait list. Unable to leave VM for pt to call this NC back.     Listing verified in [place]  Transplant tab changed to Active  Letter drafted and given to MA to mail  Notification e-mail sent

## 2018-08-27 ENCOUNTER — Encounter: Admit: 2018-08-28 | Discharge: 2018-08-28

## 2018-08-27 ENCOUNTER — Encounter: Admit: 2018-08-27 | Discharge: 2018-08-27

## 2018-08-27 DIAGNOSIS — Z7682 Awaiting organ transplant status: Secondary | ICD-10-CM

## 2018-08-27 DIAGNOSIS — N186 End stage renal disease: Secondary | ICD-10-CM

## 2018-08-27 NOTE — Telephone Encounter
Received organ offer from [facility] number [id number] / Match Run number [id number].  Reviewed donor summary and match run list.  Organ donor has KDPI of 96%, patient notified.  Organ offer is KDPI 85% or greater.  Reviewed recipients informed consent visit form.  [name] consented to [facility] greater than 85%.  Michele Benitez is primary.  Reviewed recipient's history.  Notified Michele Benitez of organ offer, organ type and current status on list.  Michele Benitez verbalized agreement to proceed with organ offer.  Assessment negative for changes in medical status, social status, insurance changes, and use of anticoagulants.  Michele Benitez notified to be on standby status and given coordinators contact information.       2230 Crossmatch results back, patient neg, neg. Per Dr. Caprice Kluver patient to come to [place] at 8 AM tomorrow. Michele Benitez instructed to come to hospital for admission for potential transplant.  Crossmatch negative.  Instructions given to Michele Benitez for NPO status, admitting location and items to bring from home.  Michele Benitez verbalized understanding of instructions.   ETA is 0800 on 08/28/18. OR for 10 AM.  Admitting notified.  Michele Benitez to be admitted to unit unknown.  OR notified.  Transplant team notified.    Discussed recent outbreak of corona virus or COVID 19.   Reviewed according to the CDC, those at increased risk for severe illness included immunocompromised, those with comorbid conditions such as cardiovascular disease and diabetes and those of advanced age.    Completed telephone screen for risk of infection exposure with patient:    ? Are you or have you been around anyone who is sick? (cough, muscle pain, vomiting, diarrhea, rash, weakness, abdominal pain, fever, red eye, bruising or bleeding, joint pain, severe headache) No  Explain:    ? Have you traveled internationally? No  Explain:    ? Have you traveled outside of [place] or [place] in the last 14 days? No  Explain:      Advised patient of limited visitor policy. Currently only one visitor allowed at this time.   All visitors including accompanied care giver must meet above criteria (no illness, no recent travel exposure) and no children are allowed at this time.

## 2018-08-28 ENCOUNTER — Ambulatory Visit: Admit: 2018-08-28 | Discharge: 2018-08-28 | Disposition: A | Discharge status: Home / Self Care

## 2018-08-28 ENCOUNTER — Encounter: Admit: 2018-08-28 | Discharge: 2018-08-28

## 2018-08-28 ENCOUNTER — Ambulatory Visit: Admit: 2018-08-28 | Discharge: 2018-08-28

## 2018-08-28 DIAGNOSIS — E785 Hyperlipidemia, unspecified: Secondary | ICD-10-CM

## 2018-08-28 DIAGNOSIS — Z538 Procedure and treatment not carried out for other reasons: Secondary | ICD-10-CM

## 2018-08-28 DIAGNOSIS — I120 Hypertensive chronic kidney disease with stage 5 chronic kidney disease or end stage renal disease: Secondary | ICD-10-CM

## 2018-08-28 DIAGNOSIS — Z7982 Long term (current) use of aspirin: Secondary | ICD-10-CM

## 2018-08-28 DIAGNOSIS — N186 End stage renal disease: Secondary | ICD-10-CM

## 2018-08-28 LAB — URINALYSIS DIPSTICK
Lab: NEGATIVE 10*3/uL — ABNORMAL HIGH (ref 3–12)
Lab: NEGATIVE U/L (ref 7–56)
Lab: NEGATIVE mL/min — ABNORMAL LOW (ref 0–0.45)
Lab: NEGATIVE
Lab: NEGATIVE
Lab: NEGATIVE

## 2018-08-28 LAB — HEMOGLOBIN A1C: Lab: 5.6 % — ABNORMAL HIGH (ref <150)

## 2018-08-28 LAB — COMPREHENSIVE METABOLIC PANEL: Lab: 142 MMOL/L — ABNORMAL LOW (ref 137–147)

## 2018-08-28 LAB — BETA-HCG: Lab: 16 U/L — ABNORMAL HIGH (ref <5)

## 2018-08-28 LAB — URINALYSIS, MICROSCOPIC

## 2018-08-28 LAB — LIPID PROFILE: Lab: 163 mg/dL — ABNORMAL HIGH (ref <200)

## 2018-08-28 LAB — PROTIME INR (PT): Lab: 1 M/UL — ABNORMAL LOW (ref 0.8–1.2)

## 2018-08-28 LAB — PTT (APTT): Lab: 35 s — ABNORMAL LOW (ref 24.0–36.5)

## 2018-08-28 LAB — PARATHYROID HORMONE: Lab: 64 pg/mL (ref >40)

## 2018-08-28 LAB — CBC AND DIFF: Lab: 6 10*3/uL (ref 4.5–11.0)

## 2018-08-28 MED ORDER — HEPARIN 2500UNITS NS 250ML IRR SOLN (OR)
Freq: Once | 0 refills | Status: DC
Start: 2018-08-28 — End: 2018-08-28

## 2018-08-28 MED ORDER — ORTHO IRRIGATION BAG
Freq: Once | 0 refills | Status: DC
Start: 2018-08-28 — End: 2018-08-28

## 2018-08-28 MED ORDER — LYMPHOCYTE IMMUNE GLOBULIN (RAB) IVPB
1.5 mg/kg | Freq: Once | INTRAVENOUS | 0 refills | Status: DC
Start: 2018-08-28 — End: 2018-08-28

## 2018-08-28 MED ORDER — AMLODIPINE 5 MG PO TAB
5 mg | Freq: Once | ORAL | 0 refills | Status: DC | PRN
Start: 2018-08-28 — End: 2018-08-28

## 2018-08-28 MED ORDER — METHYLPREDNISOLONE 500 MG IVPB
500 mg | Freq: Once | INTRAVENOUS | 0 refills | Status: DC
Start: 2018-08-28 — End: 2018-08-28

## 2018-08-28 MED ORDER — CEFAZOLIN INJ 1GM IVP
2 g | Freq: Once | INTRAVENOUS | 0 refills | Status: DC
Start: 2018-08-28 — End: 2018-08-28

## 2018-08-28 MED ORDER — MYCOPHENOLATE SODIUM 360 MG PO TBEC
720 mg | Freq: Once | ORAL | 0 refills | Status: DC
Start: 2018-08-28 — End: 2018-08-28

## 2018-08-28 MED ORDER — SODIUM CHLORIDE 0.9 % IV SOLP
1000 mL | INTRAVENOUS | 0 refills | Status: DC
Start: 2018-08-28 — End: 2018-08-28
  Administered 2018-08-28: 14:00:00 1000 mL via INTRAVENOUS

## 2018-08-28 NOTE — H&P (View-Only)
Admission History and Physical Examination      Name:  Michele Benitez                                             MRN:  [id number]   Admission Date:  08/28/2018                     Assessment/Plan:    Principal Problem:    End stage kidney disease [name])    Ms. Hollman is a 58 year old female with ESRD secondary to HTN who presents for potential DDRT    - Labs and CXR  - Type and cross  - Plan for potential DDRT today pending suitability of donor allograft  - Informed consent obtained and in chart  __________________________________________________________________________________  Primary Care Physician: Steva Ready  Verified    Chief Complaint:  ESRD  History of Present Illness: Michele Benitez is a 58 y.o. 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 She presents as a planned admission for potential deceased donor renal transplantation. She is on hemodialysis through a left radial cephalic AVF. She dialyzes on Tu/Th/Sa. She dialyzed yesterday and states it was a full run. She still makes 1/2-1 cup of urine daily. She denies fevers, chills, nausea or vomiting. She denies chest pain or shortness of breath. She has been actively on a walking program and has no issues with walking a long distance. She denies changes in her medications recently. She is on aspirin but no anticoagulation medication. She last ate last night.    Medical History:   Diagnosis Date   ??? Abnormal stress test 12/03/2015   ??? Dyslipidemia 07/18/2014   ??? ESRD (end stage renal disease) (HCC) 12/03/2015   ??? Hyperlipemia    ??? Hyperphosphatemia 07/18/2014   ??? Hypertension 07/18/2014     Surgical History:   Procedure Laterality Date   ??? Left Heart Catheterization With Ventriculogram Left 12/03/2015    Performed by Harley Alto, MD at [facility] CATH LAB   ??? Coronary Angiography N/A 12/03/2015    Performed by Harley Alto, MD at [facility] CATH LAB   ??? Possible Percutaneous Coronary Intervention N/A 12/03/2015 Performed by Harley Alto, MD at [facility] CATH LAB     Family history reviewed; non-contributory  Social History     Socioeconomic History   ??? Marital status: Married     Spouse name: Not on file   ??? Number of children: Not on file   ??? Years of education: Not on file   ??? Highest education level: Not on file   Occupational History   ??? Not on file   Social Needs   ??? Financial resource strain: Not on file   ??? Food insecurity     Worry: Not on file     Inability: Not on file   ??? Transportation needs     Medical: Not on file     Non-medical: Not on file   Tobacco Use   ??? Smoking status: Never Smoker   ??? Smokeless tobacco: Never Used   Substance and Sexual Activity   ??? Alcohol use: No   ??? Drug use: No   ??? Sexual activity: Not on file   Lifestyle   ??? Physical activity     Days per week: Not on file     Minutes per session: Not on  file   ??? Stress: Not on file   Relationships   ??? Social Wellsite geologist on phone: Not on file     Gets together: Not on file     Attends religious service: Not on file     Active member of club or organization: Not on file     Attends meetings of clubs or organizations: Not on file     Relationship status: Not on file   ??? Intimate partner violence     Fear of current or ex partner: Not on file     Emotionally abused: Not on file     Physically abused: Not on file     Forced sexual activity: Not on file   Other Topics Concern   ??? Not on file   Social History Narrative   ??? Not on file            Immunizations (includes history and patient reported):   There is no immunization history on file for this patient.        Allergies:  Patient has no known allergies.    Medications:  Medications Prior to Admission   Medication Sig   ??? acetaminophen (TYLENOL) 500 mg tablet Take 500 mg by mouth every 6 hours as needed for Pain. Max of 4,000 mg of acetaminophen in 24 hours.   ??? aspirin EC 81 mg tablet Take 81 mg by mouth daily. Take with food. ??? carvedilol (COREG) 25 mg tablet Take 25 mg by mouth twice daily as needed (SBP >140).   ??? multivit-mins 25/folic acid/D3 (DIALYVITE SUPREME D PO) Take 1 tablet by mouth daily.   ??? NIFEdipine SR (PROCARDIA-XL; ADALAT CC) 30 mg tablet Take 30 mg by mouth daily as needed (SBP >150). After dialysis   ??? ranitidine (ZANTAC) 75 mg tablet Take 75 mg by mouth daily as needed for Heartburn.   ??? simvastatin (ZOCOR) 20 mg tablet Take 20 mg by mouth at bedtime daily.   ??? sodium bicarbonate 325 mg tablet Take 650 mg by mouth twice daily.     Review of Systems:  12 point ROS reviewed, negative aside from HPI    Physical Exam:  Gen: NAD, Alert, Pleasant and Cooperative  HEENT: Normocephalic/Atraumatic, EOMI  CV: Rate 80s  Resp: Unlabored on RA  Abd: S/NT/ND, prior cesarean section scar well healed  Ext: Non edematous, palpable thrill in left forearm AVF  Vasc: Distal pulses palpable bilateral upper and lower extremities  Neuro: CN II-XII intact grossly, strength and sensation intact bilateral upper and lower extremities    Lab/Radiology/Other Diagnostic Tests:  No pertinent labs. Will review once resulted     No pertinent radiology. Will review EKG and CXR once obtained    Liane Comber, MD  Pager [phone]

## 2018-08-28 NOTE — Telephone Encounter
Biopsy of donor kidneys reivewed by providers.  Kidneys decline due to poor biopsy results.  Inpatient team to discharge pt.

## 2018-08-29 ENCOUNTER — Encounter: Admit: 2018-08-29 | Discharge: 2018-08-29

## 2018-08-29 LAB — EPSTEIN BARR  PANEL(EBV): Lab: POSITIVE mg/dL (ref <100)

## 2018-08-29 LAB — CMV AB IGG: Lab: POSITIVE mg/dL — ABNORMAL HIGH (ref 8.5–10.6)

## 2018-08-29 LAB — CMV AB IGM: Lab: NEGATIVE mg/dL — ABNORMAL HIGH (ref 0.4–1.00)

## 2018-08-30 NOTE — Discharge Instructions - Pharmacy
Physician Discharge Summary      Name: Michele Benitez  Medical Record Number: [id number]        Account Number:  [id number]  Date Of Birth:  1960/04/13                         Age:  57 years   Admit date:  08/28/2018                     Discharge date: 08/28/2018 10:46 AM    Attending Physician: Dr. Nicholos Johns, MD               Service: Surgery-Transplant    Physician Summary completed by: Liane Comber, MD    Reason for hospitalization:   End stage renal disease    Significant PMH:   Medical History:   Diagnosis Date   ??? Abnormal stress test 12/03/2015   ??? Dyslipidemia 07/18/2014   ??? ESRD (end stage renal disease) (HCC) 12/03/2015   ??? Hyperlipemia    ??? Hyperphosphatemia 07/18/2014   ??? Hypertension 07/18/2014        Allergies: Patient has no known allergies.    Admission Physical Exam notable for:    None    Admission Lab/Radiology studies notable for:   None    Brief Hospital Course:   Michele Benitez was admitted for potential kidney transplant. The allograft was found to be unsuitable for transplant. She had undergone preoperative chest X-Ray which demonstrated a nodule seen anteriorly on the lateral projection is unchanged from more   recent chest radiographs but was not present on the 2015 chest radiograph. This may represent a calcified nodule. Advise noncontrast chest CT to exclude underlying noncalcified pulmonary or mediastinal nodule.   She was discharged.    Condition at Discharge: Stable    Discharge Diagnoses:    Hospital Problems        Active Problems    * (Principal) End stage kidney disease (HCC)        Principal Problem:    End stage kidney disease (HCC)      Surgical Procedures: None    Significant Diagnostic Studies and Procedures: noted in brief hospital course    Consults:  None    Patient Disposition: Home       Patient instructions/medications:      Regular Diet    You have no dietary restriction. Please continue with a healthy balanced diet.     Report These Signs and Symptoms Please contact your doctor if you have any of the following symptoms: temperature higher than 100.4 degrees F, difficulty breathing or chest pain     Questions About Your Stay    For questions or concerns regarding your hospital stay, call [phone].     Discharging attending physician: Nicholos Johns [[id number]]      Activity as Tolerated    It is important to keep increasing your activity level after you leave the hospital.  Moving around can help prevent blood clots, lung infection (pneumonia) and other problems.  Gradually increasing the number of times you are up moving around will help you return to your normal activity level more quickly.  Continue to increase the number of times you are up to the chair and walking daily to return to your normal activity level. Begin to work toward your normal activity level at discharge      Current Discharge Medication List       CONTINUE  these medications which have NOT CHANGED    Details   acetaminophen (TYLENOL) 500 mg tablet Take 500 mg by mouth every 6 hours as needed for Pain. Max of 4,000 mg of acetaminophen in 24 hours.    PRESCRIPTION TYPE:  Historical Med      aspirin EC 81 mg tablet Take 81 mg by mouth daily. Take with food.    PRESCRIPTION TYPE:  Historical Med      carvedilol (COREG) 25 mg tablet Take 25 mg by mouth twice daily as needed (SBP >140).    PRESCRIPTION TYPE:  Historical Med      multivit-mins 25/folic acid/D3 (DIALYVITE SUPREME D PO) Take 1 tablet by mouth daily.    PRESCRIPTION TYPE:  Historical Med      NIFEdipine SR (PROCARDIA-XL; ADALAT CC) 30 mg tablet Take 30 mg by mouth daily as needed (SBP >150). After dialysis    PRESCRIPTION TYPE:  Historical Med      ranitidine (ZANTAC) 75 mg tablet Take 75 mg by mouth daily as needed for Heartburn.    PRESCRIPTION TYPE:  Historical Med      simvastatin (ZOCOR) 20 mg tablet Take 20 mg by mouth at bedtime daily.    PRESCRIPTION TYPE:  Historical Med sodium bicarbonate 325 mg tablet Take 650 mg by mouth twice daily.    PRESCRIPTION TYPE:  Historical Med              No future appointments.    Pending items needing follow up: CXR demonstrates:  IMPRESSION      1. Nodule seen anteriorly on the lateral projection is unchanged from more   recent chest radiographs but was not present on the 2015 chest radiograph.   This may represent a calcified nodule. Advise noncontrast chest CT to   exclude underlying noncalcified pulmonary or mediastinal nodule.   2. No consolidation.    CRITICAL FINDINGS:    The above impressions were verbally communicated by telephone to the   patient's nurse, Michele Benitez by Dr. Dan Humphreys at 08/28/2018 10:13 AM.    Signed:  Liane Comber, MD  08/30/2018      cc:  Primary Care Physician:  Steva Ready   Verified  Referring physicians:     Additional provider(s):

## 2018-08-31 ENCOUNTER — Encounter: Admit: 2018-08-31 | Discharge: 2018-08-31

## 2018-08-31 DIAGNOSIS — Z005 Encounter for examination of potential donor of organ and tissue: Secondary | ICD-10-CM

## 2018-08-31 NOTE — Telephone Encounter
WAITLIST FINANCIAL UPDATE    Insurance has been verified though [employer].  As of 08/31/18 patient has ACTIVE   Primary Medicare ABD. Ref.#:[id number].  Secondary BC[employer]. Ref.#: [id number].  *Financial Assessment Approved  *No Auth Needed for Listing  *No Financial Concerns / Barriers  MM 08/31/18

## 2018-08-31 NOTE — Telephone Encounter
Received organ offer from [facility] number [id number] / Match Run number [id number].  Reviewed donor summary and match run list.  Organ donor has KDPI of 64%, patient notified.  Organ offer is donation after circulatory death.  Reviewed recipients informed consent visit form.  Michele Benitez consented to [facility] greater than 85%.  Michele Benitez is back up status.  Reviewed recipient's history.  Notified Michele Benitez of organ offer, organ type and current status on list.  Michele Benitez verbalized agreement to proceed with organ offer.  Assessment negative for changes in medical status, social status, insurance changes, and use of anticoagulants.  Michele Benitez notified to be on standby status and given coordinators contact information.          Discussed recent outbreak of corona virus or COVID 19.   Reviewed according to the CDC, those at increased risk for severe illness included immunocompromised, those with comorbid conditions such as cardiovascular disease and diabetes and those of advanced age.    Completed telephone screen for risk of infection exposure with patient:    ? Are you or have you been around anyone who is sick? (cough, muscle pain, vomiting, diarrhea, rash, weakness, abdominal pain, fever, red eye, bruising or bleeding, joint pain, severe headache) No  Explain:    ? Have you traveled internationally? No  Explain:    ? Have you traveled outside of [place] or [place] in the last 14 days? No  Explain:      Completed telephone screen for risk of infection exposure with identified care giver support with patient:    ? Are you or have you been around anyone who is sick? (cough, muscle pain, vomiting, diarrhea, rash, weakness, abdominal pain, fever, red eye, bruising or bleeding, joint pain, severe headache) No  Explain:    ? Have you traveled internationally? No  Explain:    ? Have you traveled outside of [place] or [place] in the last 14 days? No  Explain:      Advised patient of limited visitor policy. Currently only one visitor allowed at this time.   All visitors including accompanied care giver must meet above criteria (no illness, no recent travel exposure) and no children are allowed at this time.

## 2018-09-01 ENCOUNTER — Encounter: Admit: 2018-09-01 | Discharge: 2018-09-01

## 2018-09-01 DIAGNOSIS — N186 End stage renal disease: Secondary | ICD-10-CM

## 2018-09-01 DIAGNOSIS — E785 Hyperlipidemia, unspecified: Secondary | ICD-10-CM

## 2018-09-01 DIAGNOSIS — I10 Essential (primary) hypertension: Secondary | ICD-10-CM

## 2018-09-01 DIAGNOSIS — Z005 Encounter for examination of potential donor of organ and tissue: Secondary | ICD-10-CM

## 2018-09-01 DIAGNOSIS — Z7682 Awaiting organ transplant status: Secondary | ICD-10-CM

## 2018-09-01 DIAGNOSIS — D899 Disorder involving the immune mechanism, unspecified: Secondary | ICD-10-CM

## 2018-09-01 DIAGNOSIS — Z940 Kidney transplant status: Secondary | ICD-10-CM

## 2018-09-01 DIAGNOSIS — R9439 Abnormal result of other cardiovascular function study: Secondary | ICD-10-CM

## 2018-09-01 DIAGNOSIS — N189 Chronic kidney disease, unspecified: Secondary | ICD-10-CM

## 2018-09-01 MED ORDER — HEPARIN, PORCINE (PF) 5,000 UNIT/0.5 ML IJ SYRG
5000 [IU] | SUBCUTANEOUS | 0 refills | Status: DC
Start: 2018-09-01 — End: 2018-09-04
  Administered 2018-09-03 – 2018-09-04 (×5): 5000 [IU] via SUBCUTANEOUS

## 2018-09-01 MED ORDER — TACROLIMUS 1 MG PO CAP
8 mg | ORAL_CAPSULE | Freq: Two times a day (BID) | ORAL | 11 refills | 30.00000 days | Status: DC
Start: 2018-09-01 — End: 2018-09-05
  Filled 2018-09-05: qty 480, 30d supply, fill #1

## 2018-09-01 MED ORDER — MYCOPHENOLATE SODIUM 360 MG PO TBEC
720 mg | Freq: Two times a day (BID) | ORAL | 0 refills | Status: DC
Start: 2018-09-01 — End: 2018-09-05
  Administered 2018-09-01 – 2018-09-05 (×8): 720 mg via ORAL

## 2018-09-01 MED ORDER — METHYLPREDNISOLONE SOD SUC(PF) 40 MG/ML IJ SOLR
40 mg | Freq: Every day | INTRAVENOUS | 0 refills | Status: DC
Start: 2018-09-01 — End: 2018-09-02

## 2018-09-01 MED ORDER — FENTANYL CITRATE (PF) 50 MCG/ML IJ SOLN
50 ug | INTRAVENOUS | 0 refills | Status: DC | PRN
Start: 2018-09-01 — End: 2018-09-01

## 2018-09-01 MED ORDER — IMS MIXTURE TEMPLATE
4.5 mg | Freq: Two times a day (BID) | ORAL | 0 refills | Status: DC
Start: 2018-09-01 — End: 2018-09-03
  Administered 2018-09-01 – 2018-09-03 (×8): 4.5 mg via ORAL

## 2018-09-01 MED ORDER — SODIUM CHLORIDE 0.9 % IV SOLP
1000 mL | INTRAVENOUS | 0 refills | Status: CP
Start: 2018-09-01 — End: ?
  Administered 2018-09-01: 21:00:00 1000 mL via INTRAVENOUS

## 2018-09-01 MED ORDER — SUCCINYLCHOLINE CHLORIDE 20 MG/ML IJ SOLN
INTRAVENOUS | 0 refills | Status: DC
Start: 2018-09-01 — End: 2018-09-01
  Administered 2018-09-01: 14:00:00 130 mg via INTRAVENOUS

## 2018-09-01 MED ORDER — CEFAZOLIN INJ 500MG IVP
500 mg | Freq: Two times a day (BID) | INTRAVENOUS | 0 refills | Status: CP
Start: 2018-09-01 — End: ?
  Administered 2018-09-02 – 2018-09-03 (×4): 500 mg via INTRAVENOUS

## 2018-09-01 MED ORDER — PREDNISONE 10 MG PO TAB
10 mg | Freq: Every day | ORAL | 0 refills | Status: DC
Start: 2018-09-01 — End: 2018-09-02

## 2018-09-01 MED ORDER — SODIUM CHLORIDE 0.9 % IV SOLP
1000 mL | INTRAVENOUS | 0 refills | Status: CP
Start: 2018-09-01 — End: ?
  Administered 2018-09-02: 01:00:00 1000 mL via INTRAVENOUS

## 2018-09-01 MED ORDER — FENTANYL CITRATE (PF) 50 MCG/ML IJ SOLN
25 ug | INTRAVENOUS | 0 refills | Status: DC | PRN
Start: 2018-09-01 — End: 2018-09-01
  Administered 2018-09-01 (×2): 25 ug via INTRAVENOUS

## 2018-09-01 MED ORDER — OXYCODONE 5 MG PO TAB
5-15 mg | ORAL | 0 refills | Status: DC | PRN
Start: 2018-09-01 — End: 2018-09-05

## 2018-09-01 MED ORDER — PHENYLEPHRINE IN 0.9% NACL(PF) 1 MG/10 ML (100 MCG/ML) IV SYRG
INTRAVENOUS | 0 refills | Status: DC
Start: 2018-09-01 — End: 2018-09-01

## 2018-09-01 MED ORDER — LYMPHOCYTE IMMUNE GLOBULIN (RAB) IVPB (PERIPHERAL)
Freq: Once | INTRAVENOUS | 0 refills | Status: CP
Start: 2018-09-01 — End: ?
  Administered 2018-09-01 (×4): 88.6 mL/h via INTRAVENOUS

## 2018-09-01 MED ORDER — MANNITOL 25 % 25 % IV SOLN
0 refills | Status: DC
Start: 2018-09-01 — End: 2018-09-01
  Administered 2018-09-01: 16:00:00 25 g via INTRAVENOUS

## 2018-09-01 MED ORDER — SODIUM CHLORIDE 0.9 % IV SOLP
500 mL | INTRAVENOUS | 0 refills | Status: CP
Start: 2018-09-01 — End: ?
  Administered 2018-09-02: 500 mL via INTRAVENOUS

## 2018-09-01 MED ORDER — METHYLPREDNISOLONE SOD SUC(PF) 125 MG/2 ML IJ SOLR
80 mg | Freq: Every day | INTRAVENOUS | 0 refills | Status: DC
Start: 2018-09-01 — End: 2018-09-02

## 2018-09-01 MED ORDER — PREDNISONE 20 MG PO TAB
20 mg | Freq: Every day | ORAL | 0 refills | Status: DC
Start: 2018-09-01 — End: 2018-09-02

## 2018-09-01 MED ORDER — SODIUM CHLORIDE 0.45% WITH SODIUM BICARB IV INFUSION
Freq: Once | INTRAVENOUS | 0 refills | Status: CP
Start: 2018-09-01 — End: ?
  Administered 2018-09-01 (×2): 1050.000 mL via INTRAVENOUS
  Administered 2018-09-01 (×2): 1000 mL via INTRAVENOUS

## 2018-09-01 MED ORDER — HALOPERIDOL LACTATE 5 MG/ML IJ SOLN
1 mg | Freq: Once | INTRAVENOUS | 0 refills | Status: CP
Start: 2018-09-01 — End: ?
  Administered 2018-09-01: 20:00:00 1 mg via INTRAVENOUS

## 2018-09-01 MED ORDER — ONDANSETRON HCL (PF) 4 MG/2 ML IJ SOLN
INTRAVENOUS | 0 refills | Status: DC
Start: 2018-09-01 — End: 2018-09-01
  Administered 2018-09-01: 17:00:00 4 mg via INTRAVENOUS

## 2018-09-01 MED ORDER — METHYLPREDNISOLONE 500 MG IVPB
500 mg | Freq: Once | INTRAVENOUS | 0 refills | Status: CP
Start: 2018-09-01 — End: ?
  Administered 2018-09-01 (×2): 500 mg via INTRAVENOUS
  Administered 2018-09-01 (×2): 108.000 mL via INTRAVENOUS

## 2018-09-01 MED ORDER — ORTHO IRRIGATION BAG
Freq: Once | 0 refills | Status: AC
Start: 2018-09-01 — End: ?

## 2018-09-01 MED ORDER — ALBUMIN, HUMAN 5 % 500 ML IV SOLP (AN)(OSM)
0 refills | Status: DC
Start: 2018-09-01 — End: 2018-09-01
  Administered 2018-09-01: 16:00:00 via INTRAVENOUS

## 2018-09-01 MED ORDER — ONDANSETRON HCL (PF) 4 MG/2 ML IJ SOLN
4-8 mg | INTRAVENOUS | 0 refills | Status: DC | PRN
Start: 2018-09-01 — End: 2018-09-05

## 2018-09-01 MED ORDER — DIPHENHYDRAMINE HCL 50 MG/ML IJ SOLN
25 mg | Freq: Once | INTRAVENOUS | 0 refills | Status: DC | PRN
Start: 2018-09-01 — End: 2018-09-01

## 2018-09-01 MED ORDER — PREDNISONE 5 MG PO TAB
5 mg | Freq: Every day | ORAL | 0 refills | Status: DC
Start: 2018-09-01 — End: 2018-09-02

## 2018-09-01 MED ORDER — SODIUM CHLORIDE 0.45% WITH SODIUM BICARB 10MEQ IV INFUSION
INTRAVENOUS | 0 refills | Status: AC
Start: 2018-09-01 — End: ?
  Administered 2018-09-01 – 2018-09-02 (×4): 1000.000 mL via INTRAVENOUS

## 2018-09-01 MED ORDER — ACETAMINOPHEN 325 MG PO TAB
650 mg | ORAL | 0 refills | Status: DC
Start: 2018-09-01 — End: 2018-09-02
  Administered 2018-09-02 (×2): 650 mg via ORAL

## 2018-09-01 MED ORDER — MYCOPHENOLATE SODIUM 180 MG PO TBEC
720 mg | ORAL_TABLET | Freq: Two times a day (BID) | ORAL | 11 refills | Status: DC
Start: 2018-09-01 — End: 2018-11-15
  Filled 2018-09-05: qty 240, 30d supply, fill #1

## 2018-09-01 MED ORDER — CISATRACURIUM 2 MG/ML IV SOLN
0 refills | Status: DC
Start: 2018-09-01 — End: 2018-09-01
  Administered 2018-09-01 (×2): 4 mg via INTRAVENOUS
  Administered 2018-09-01: 14:00:00 2 mg via INTRAVENOUS
  Administered 2018-09-01: 16:00:00 4 mg via INTRAVENOUS

## 2018-09-01 MED ORDER — LIDOCAINE (PF) 10 MG/ML (1 %) IJ SOLN
.1-2 mL | INTRAMUSCULAR | 0 refills | Status: DC | PRN
Start: 2018-09-01 — End: 2018-09-01

## 2018-09-01 MED ORDER — CEFAZOLIN INJ 1GM IVP
2 g | Freq: Once | INTRAVENOUS | 0 refills | Status: CP
Start: 2018-09-01 — End: ?
  Administered 2018-09-01: 14:00:00 2 g via INTRAVENOUS

## 2018-09-01 MED ORDER — SODIUM CHLORIDE 0.9 % IV SOLP
1000 mL | INTRAVENOUS | 0 refills | Status: DC
Start: 2018-09-01 — End: 2018-09-02
  Administered 2018-09-01: 14:00:00 1000.000 mL via INTRAVENOUS

## 2018-09-01 MED ORDER — FENTANYL CITRATE (PF) 50 MCG/ML IJ SOLN
25-50 ug | INTRAVENOUS | 0 refills | Status: DC | PRN
Start: 2018-09-01 — End: 2018-09-03
  Administered 2018-09-02 (×3): 25 ug via INTRAVENOUS

## 2018-09-01 MED ORDER — MIDAZOLAM 1 MG/ML IJ SOLN
INTRAVENOUS | 0 refills | Status: DC
Start: 2018-09-01 — End: 2018-09-01
  Administered 2018-09-01: 14:00:00 1 mg via INTRAVENOUS

## 2018-09-01 MED ORDER — OXYCODONE 5 MG PO TAB
5-10 mg | Freq: Once | ORAL | 0 refills | Status: AC
Start: 2018-09-01 — End: ?

## 2018-09-01 MED ORDER — MYCOPHENOLATE SODIUM 360 MG PO TBEC
720 mg | Freq: Once | ORAL | 0 refills | Status: DC
Start: 2018-09-01 — End: 2018-09-01

## 2018-09-01 MED ORDER — PHENYLEPHRINE IN 0.9% NACL(PF) 1 MG/10 ML (100 MCG/ML) IV SYRG
0 refills | Status: DC
Start: 2018-09-01 — End: 2018-09-01
  Administered 2018-09-01: 14:00:00 100 ug via INTRAVENOUS

## 2018-09-01 MED ORDER — CEPHALEXIN 500 MG PO CAP
500 mg | Freq: Two times a day (BID) | ORAL | 0 refills | Status: DC
Start: 2018-09-01 — End: 2018-09-05
  Administered 2018-09-04 – 2018-09-05 (×4): 500 mg via ORAL

## 2018-09-01 MED ORDER — NEOSTIGMINE METHYLSULFATE 1 MG/ML IJ SOLN
0 refills | Status: DC
Start: 2018-09-01 — End: 2018-09-01
  Administered 2018-09-01: 17:00:00 5 mg via INTRAVENOUS

## 2018-09-01 MED ORDER — METHYLPREDNISOLONE SOD SUC(PF) 125 MG/2 ML IJ SOLR
120 mg | Freq: Every day | INTRAVENOUS | 0 refills | Status: CP
Start: 2018-09-01 — End: ?
  Administered 2018-09-02: 20:00:00 120 mg via INTRAVENOUS

## 2018-09-01 MED ORDER — ORTHO IRRIGATION BAG
0 refills | Status: DC
Start: 2018-09-01 — End: 2018-09-01
  Administered 2018-09-01 (×3): 1000 mL

## 2018-09-01 MED ORDER — HEPARIN 2500UNITS NS 250ML IRR SOLN (OR)
Freq: Once | 0 refills | Status: AC
Start: 2018-09-01 — End: ?

## 2018-09-01 MED ORDER — ETOMIDATE 2 MG/ML IV SOLN
0 refills | Status: DC
Start: 2018-09-01 — End: 2018-09-01
  Administered 2018-09-01: 14:00:00 14 mg via INTRAVENOUS

## 2018-09-01 MED ORDER — PROMETHAZINE 25 MG/ML IJ SOLN
6.25 mg | INTRAVENOUS | 0 refills | Status: DC | PRN
Start: 2018-09-01 — End: 2018-09-01
  Administered 2018-09-01 (×2): 6.25 mg via INTRAVENOUS

## 2018-09-01 MED ORDER — LIDOCAINE (PF) 200 MG/10 ML (2 %) IJ SYRG
0 refills | Status: DC
Start: 2018-09-01 — End: 2018-09-01
  Administered 2018-09-01: 14:00:00 80 mg via INTRAVENOUS

## 2018-09-01 MED ORDER — FENTANYL CITRATE (PF) 50 MCG/ML IJ SOLN
0 refills | Status: DC
Start: 2018-09-01 — End: 2018-09-01
  Administered 2018-09-01 (×2): 50 ug via INTRAVENOUS
  Administered 2018-09-01: 14:00:00 100 ug via INTRAVENOUS
  Administered 2018-09-01: 17:00:00 50 ug via INTRAVENOUS

## 2018-09-01 MED ORDER — FUROSEMIDE 10 MG/ML IJ SOLN
0 refills | Status: DC
Start: 2018-09-01 — End: 2018-09-01
  Administered 2018-09-01: 16:00:00 100 mg via INTRAVENOUS

## 2018-09-01 MED ORDER — VALGANCICLOVIR 450 MG PO TAB
900 mg | ORAL_TABLET | Freq: Every day | ORAL | 2 refills | Status: DC
Start: 2018-09-01 — End: 2018-09-05
  Filled 2018-09-05: qty 60, 30d supply, fill #1

## 2018-09-01 MED ORDER — DEXTRAN 70-HYPROMELLOSE (PF) 0.1-0.3 % OP DPET
0 refills | Status: DC
Start: 2018-09-01 — End: 2018-09-01
  Administered 2018-09-01: 14:00:00 2 [drp] via OPHTHALMIC

## 2018-09-01 MED ORDER — IMS MIXTURE TEMPLATE
15 mg | Freq: Every day | ORAL | 0 refills | Status: DC
Start: 2018-09-01 — End: 2018-09-02

## 2018-09-01 MED ORDER — HEPARIN 2500UNITS NS 250ML IRR SOLN (OR)
0 refills | Status: DC
Start: 2018-09-01 — End: 2018-09-01
  Administered 2018-09-01 (×2): 250 mL

## 2018-09-01 MED ORDER — GLYCOPYRROLATE 0.2 MG/ML IJ SOLN
0 refills | Status: DC
Start: 2018-09-01 — End: 2018-09-01
  Administered 2018-09-01: 15:00:00 0.2 mg via INTRAVENOUS
  Administered 2018-09-01: 17:00:00 1 mg via INTRAVENOUS

## 2018-09-01 MED ORDER — METOCLOPRAMIDE HCL 5 MG/ML IJ SOLN
10 mg | Freq: Once | INTRAVENOUS | 0 refills | Status: DC | PRN
Start: 2018-09-01 — End: 2018-09-01

## 2018-09-01 MED ORDER — AMLODIPINE 5 MG PO TAB
5 mg | Freq: Once | ORAL | 0 refills | Status: DC | PRN
Start: 2018-09-01 — End: 2018-09-01

## 2018-09-01 MED ORDER — IMS MIXTURE TEMPLATE
30 mg | Freq: Every day | ORAL | 0 refills | Status: DC
Start: 2018-09-01 — End: 2018-09-02

## 2018-09-01 MED ORDER — NYSTATIN 100,000 UNIT/ML PO SUSP
500000 [IU] | Freq: Four times a day (QID) | ORAL | 0 refills | Status: DC
Start: 2018-09-01 — End: 2018-09-05
  Administered 2018-09-01 – 2018-09-05 (×16): 500000 [IU] via ORAL

## 2018-09-01 MED ORDER — PANTOPRAZOLE 40 MG IV SOLR
40 mg | Freq: Every day | INTRAVENOUS | 0 refills | Status: DC
Start: 2018-09-01 — End: 2018-09-05
  Administered 2018-09-01 – 2018-09-04 (×4): 40 mg via INTRAVENOUS

## 2018-09-01 MED ORDER — SODIUM CHLORIDE 0.45% WITH SODIUM BICARB 10MEQ IV INFUSION
INTRAVENOUS | 0 refills | Status: DC
Start: 2018-09-01 — End: 2018-09-02

## 2018-09-01 NOTE — Progress Notes
~  16: Surgrery transplant team notified of patient JP drain saturated with drainage. [place] with surgery - transplant at bedside to change dressing. Plans to change dressing PRN if needed. Per MD, this is to be expected.     ~1449: Surgery transplant paged and notified of critical labs. No new orders at this time. Will cont. to monitor.    ~1517: Dr. Edison Pace with surgery transplant at bedside. Updated on patient status. Per MD, re draw CBC. CBC drawn and sent per orders. No new orders at this time. Okay for patient to move to inpatient unit. Will cont. to monitor.    ~1540: Surgery transplant team paged for BP parameters. See doc flow sheet for details.     ~7425: Dr. Edison Pace with surgery transplant paged for BP parameters. V/O for 1L NACL bolus via pressure bag at this time.     ~1620: Dr. Edison Pace aware of patient lab results. Surgery transplant team also aware patient unable to receive myfortic dose in PACU d/t nausea. No new orders at this time. Plans for patient to receive dose as soon as patient can.     ~1621: Updated report given to [place], RN on unit 64.

## 2018-09-01 NOTE — Anesthesia Post-Procedure Evaluation
Post-Anesthesia Evaluation    Name: Michele Benitez      MRN: [id number]     DOB: 1960/06/09     Age: 58 y.o.     Sex: female   __________________________________________________________________________     Procedure Date: 09/01/2018  Procedure(s):  ALLOTRANSPLANTATION KIDNEY FROM NON LIVING DONOR WITHOUT RECIPIENT NEPHRECTOMY      Surgeon: Surgeon(s):  Leilani Able, MD  Elza Rafter, MD    Post-Anesthesia Vitals  BP: 147/85 (06/11 1500)  Pulse: 70 (06/11 1500)  Respirations: 13 PER MINUTE (06/11 1500)  SpO2: 98 % (06/11 1500)  SpO2 Pulse: 70 (06/11 1500)   Vitals Value Taken Time   BP 147/85 09/01/2018  3:00 PM   Temp 36.4 C (97.5 F) 09/01/2018 12:16 PM   Pulse 70 09/01/2018  3:00 PM   Respirations 13 PER MINUTE 09/01/2018  3:00 PM   SpO2 98 % 09/01/2018  3:00 PM         Post Anesthesia Evaluation Note    Evaluation location: Pre/Post  Patient participation: recovered; patient participated in evaluation  Level of consciousness: alert  Pain management: adequate    Hydration: normovolemia  Temperature: 36.0C - 38.4C  Airway patency: adequate    Perioperative Events       Post-op nausea and vomiting: nausea; vomiting; resolved    Postoperative Status  Cardiovascular status: hemodynamically stable  Respiratory status: spontaneous ventilation and supplemental oxygen (2L NC)  Follow-up needed: none        Perioperative Events  Perioperative Event: No  Emergency Case Activation: No

## 2018-09-01 NOTE — Other
Brief Operative Note    Name: Michele Benitez is a 58 y.o. female     DOB: [birth year]             MRN#: [id number]  DATE OF OPERATION: 09/01/2018    Date:  09/01/2018        Preoperative Dx:   Chronic kidney disease, unspecified CKD stage [N18.9]    Post-op Diagnosis      * Chronic kidney disease, unspecified CKD stage [N18.9]    Procedure(s):  ALLOTRANSPLANTATION KIDNEY FROM NON LIVING DONOR WITHOUT RECIPIENT NEPHRECTOMY    Anesthesia Type: General    Surgeon(s) and Role:     * Nawabi, Roberts Gaudy, MD - Primary     * Elza Rafter, MD - Resident - Assisting    Findings:  Single artery, vein and ureter. Evidence of repair to artery prior to arrival. Excellent Doppler signals following anastomosis of renal artery as well as proximal iliac artery and distal external iliac artery.    Estimated Blood Loss: 200 ml    Specimen(s) Removed/Disposition: * No specimens in log *    Complications:  None    Implants: 6Fr nephroureteral stent    Drains: Foley Catheter: 0 mL and Jackson-Pratt Drain: #1 = 0 mL    Disposition:  PACU - stable    Lucinda Dell, MD  Pager [phone]

## 2018-09-01 NOTE — Telephone Encounter
Michele Benitez instructed to come to hospital for admission for potential transplant.  Crossmatch pending.  Instructions given to Michele Benitez for NPO status, admitting location and items to bring from home.  Michele Benitez verbalized understanding of instructions.   ETA is 0500.  hours.  Admitting notified.  Michele Benitez to be admitted to unit TBD.  OR notified.  Transplant team notified.    Michele Benitez [id number]   Admission: 09/01/18   Expected OR: 09/01/18   Nephrologist: Dr. Lyndel Safe  Surgeon: Dr. Juleen Starr  Donor ID: [id number]  Match #: [id number]  CMV: Donor +, Recipient +  CPRA: 0%, KDPI: 75%  Donor ABO: O Recipient ABO: O  Kidney Laterality: TBD  ETA: 0500

## 2018-09-01 NOTE — Telephone Encounter
Michele Benitez received Right organ from [facility] #[id number] /Match run # [id number]  on 09/01/2018.  Michele Benitez removed from [facility] wait list within 24 hours of start of first anastomosis Yes.  Dialysis unit and referring provider notified of transplant.

## 2018-09-01 NOTE — Telephone Encounter
Contacted pt to release from backup status. Pt v/u of all info.

## 2018-09-02 ENCOUNTER — Encounter: Admit: 2018-09-02 | Discharge: 2018-09-02

## 2018-09-02 DIAGNOSIS — N186 End stage renal disease: Secondary | ICD-10-CM

## 2018-09-02 DIAGNOSIS — B349 Viral infection, unspecified: Secondary | ICD-10-CM

## 2018-09-02 DIAGNOSIS — Z940 Kidney transplant status: Secondary | ICD-10-CM

## 2018-09-02 DIAGNOSIS — Z5181 Encounter for therapeutic drug level monitoring: Secondary | ICD-10-CM

## 2018-09-02 MED ORDER — BISACODYL 10 MG RE SUPP
10 mg | Freq: Every day | RECTAL | 0 refills | Status: DC | PRN
Start: 2018-09-02 — End: 2018-09-05

## 2018-09-02 MED ORDER — LYMPHOCYTE IMMUNE GLOBULIN (RAB) IVPB (PERIPHERAL)
Freq: Once | INTRAVENOUS | 0 refills | Status: CP
Start: 2018-09-02 — End: ?
  Administered 2018-09-02 (×4): 521.400 mL via INTRAVENOUS

## 2018-09-02 MED ORDER — IMS MIXTURE TEMPLATE
30 mg | Freq: Every day | ORAL | 0 refills | Status: CP
Start: 2018-09-02 — End: ?
  Administered 2018-09-05 (×2): 30 mg via ORAL

## 2018-09-02 MED ORDER — PREDNISONE 10 MG PO TAB
10 mg | Freq: Every day | ORAL | 0 refills | Status: DC
Start: 2018-09-02 — End: 2018-09-05

## 2018-09-02 MED ORDER — MAGNESIUM SULFATE IN D5W 1 GRAM/100 ML IV PGBK
1 g | INTRAVENOUS | 0 refills | Status: CP
Start: 2018-09-02 — End: ?
  Administered 2018-09-02 (×2): 1 g via INTRAVENOUS

## 2018-09-02 MED ORDER — METHYLPREDNISOLONE SOD SUC(PF) 125 MG/2 ML IJ SOLR
80 mg | Freq: Every day | INTRAVENOUS | 0 refills | Status: CP
Start: 2018-09-02 — End: ?
  Administered 2018-09-03: 13:00:00 80 mg via INTRAVENOUS

## 2018-09-02 MED ORDER — POLYETHYLENE GLYCOL 3350 17 GRAM PO PWPK
1 | Freq: Every day | ORAL | 0 refills | Status: DC
Start: 2018-09-02 — End: 2018-09-05
  Administered 2018-09-02 – 2018-09-03 (×2): 17 g via ORAL

## 2018-09-02 MED ORDER — DIPHENHYDRAMINE HCL 25 MG PO CAP
25 mg | Freq: Once | ORAL | 0 refills | Status: CP
Start: 2018-09-02 — End: ?
  Administered 2018-09-03: 13:00:00 25 mg via ORAL

## 2018-09-02 MED ORDER — ACETAMINOPHEN 325 MG PO TAB
650 mg | Freq: Once | ORAL | 0 refills | Status: DC
Start: 2018-09-02 — End: 2018-09-03

## 2018-09-02 MED ORDER — SENNOSIDES-DOCUSATE SODIUM 8.6-50 MG PO TAB
2 | Freq: Two times a day (BID) | ORAL | 0 refills | Status: DC
Start: 2018-09-02 — End: 2018-09-05
  Administered 2018-09-03 – 2018-09-05 (×4): 2 via ORAL

## 2018-09-02 MED ORDER — BUTALBITAL-ACETAMINOPHEN-CAFF 50-325-40 MG PO TAB
1-2 | ORAL | 0 refills | Status: DC | PRN
Start: 2018-09-02 — End: 2018-09-05
  Administered 2018-09-02: 18:00:00 1 via ORAL

## 2018-09-02 MED ORDER — METHYLPREDNISOLONE SOD SUC(PF) 40 MG/ML IJ SOLR
40 mg | Freq: Every day | INTRAVENOUS | 0 refills | Status: CP
Start: 2018-09-02 — End: ?
  Administered 2018-09-04: 18:00:00 40 mg via INTRAVENOUS

## 2018-09-02 MED ORDER — EPOETIN ALFA 4,000 UNIT/ML IJ SOLN
4000 [IU] | SUBCUTANEOUS | 0 refills | Status: DC
Start: 2018-09-02 — End: 2018-09-05
  Administered 2018-09-03: 01:00:00 4000 [IU] via SUBCUTANEOUS

## 2018-09-02 MED ORDER — PREDNISONE 20 MG PO TAB
20 mg | Freq: Every day | ORAL | 0 refills | Status: DC
Start: 2018-09-02 — End: 2018-09-05

## 2018-09-02 MED ORDER — FUROSEMIDE 10 MG/ML IJ SOLN
100 mg | Freq: Once | INTRAVENOUS | 0 refills | Status: CP
Start: 2018-09-02 — End: ?
  Administered 2018-09-02: 18:00:00 100 mg via INTRAVENOUS

## 2018-09-02 MED ORDER — PREDNISONE 5 MG PO TAB
5 mg | Freq: Every day | ORAL | 0 refills | Status: DC
Start: 2018-09-02 — End: 2018-09-05

## 2018-09-02 MED ORDER — LYMPHOCYTE IMMUNE GLOBULIN (RAB) IVPB (PERIPHERAL)
Freq: Once | INTRAVENOUS | 0 refills | Status: DC
Start: 2018-09-02 — End: 2018-09-02

## 2018-09-02 MED ORDER — ACETAMINOPHEN 325 MG PO TAB
650 mg | Freq: Once | ORAL | 0 refills | Status: CP
Start: 2018-09-02 — End: ?
  Administered 2018-09-03: 14:00:00 650 mg via ORAL

## 2018-09-02 MED ORDER — ASPIRIN 81 MG PO CHEW
81 mg | Freq: Every day | ORAL | 0 refills | Status: DC
Start: 2018-09-02 — End: 2018-09-04
  Administered 2018-09-02 – 2018-09-04 (×3): 81 mg via ORAL

## 2018-09-02 MED ORDER — LYMPHOCYTE IMMUNE GLOBULIN (RAB) IVPB (PERIPHERAL)
Freq: Once | INTRAVENOUS | 0 refills | Status: DC
Start: 2018-09-02 — End: 2018-09-03

## 2018-09-02 MED ORDER — DIPHENHYDRAMINE HCL 25 MG PO CAP
25 mg | Freq: Once | ORAL | 0 refills | Status: CP
Start: 2018-09-02 — End: ?
  Administered 2018-09-02: 20:00:00 25 mg via ORAL

## 2018-09-02 MED ORDER — ACETAMINOPHEN 500 MG PO TAB
1000 mg | Freq: Once | ORAL | 0 refills | Status: CP
Start: 2018-09-02 — End: ?
  Administered 2018-09-02: 14:00:00 1000 mg via ORAL

## 2018-09-02 MED ORDER — IMS MIXTURE TEMPLATE
15 mg | Freq: Every day | ORAL | 0 refills | Status: DC
Start: 2018-09-02 — End: 2018-09-05

## 2018-09-02 MED ORDER — ACETAMINOPHEN 325 MG PO TAB
650 mg | ORAL | 0 refills | Status: DC | PRN
Start: 2018-09-02 — End: 2018-09-05
  Administered 2018-09-03 – 2018-09-05 (×3): 650 mg via ORAL

## 2018-09-02 NOTE — Consults
CLINICAL NUTRITION                                                        Clinical Nutrition Initial Assessment  Transplant Phase    Name: Michele Benitez        MRN: [id number]          DOB: 1960-07-20          Age: 58 y.o.  Admission Date: 09/01/2018             LOS: 1 day        Recommendation:  ADAT to Regular Diet     Comments:  58 yo female with Hx of HTN on HD since 08/2014. 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 Met with patient and spouse at bedside today. Patient is currently on bedrest, diet changed to clears during visit and got her some jello and chicken broth. She reports following a Renal diet at home PTA with a UBW of ~179 pounds. Current wt from bed is 193 pounds. Reviewed nutrition after transplant. Phos 5.7. 24hr UO -1,938mL. Patient has no h/o DM. Will review nutrition after transplant prior to d/c.     Nutrition Assessment of Patient:  Admit Weight: 88.8 kg;  ; Desired Weight: 70 kg  BMI (Calculated): 31.6; BMI Categories Adult: Obesity Class I: 30-34.9; Appearance: Overweight    Pertinent Allergies/Intolerances: n/a  Pertinent Labs: phos 5.7, K+ 5.2, cr 7.07; Pertinent Meds: oxy, protonix;    Oral Diet Order: Clear liquid;    Current Oral Intake: Inadequate       Estimated Calorie Needs: 1960(28kcal/kg/desired wt)  Estimated Protein Needs: 91(1.3g/kg/desired wt)    Malnutrition Assessment:   Adequately nourished prior to admission;  ;  ;  ;  ;         Nutrition Focused Physical Assessment:   Loss of Subcutaneous Fat: No;  ;    Muscle Wasting: No;  ;    Edema: No;  ;    Pressure Injury: none noted  Comment: LBM PTA    Nutrition Diagnosis:  Increased nutrient needs, specify:(kcal/protein)  Etiology: demands for healing  Signs & Symptoms: s/p DDRT          Intervention / Plan:  monitor wt, lab trends and Gi symptoms  monitor po intake and adequacy       Goals:  Verbalize understanding of diet  Time Frame: Prior to discharge  Avoid prolonged clear liquid status Time Frame: Within 72 hours           Lajuana Carry, MS, RD, CSR, LD    Office: [phone]   [place]: [phone]

## 2018-09-02 NOTE — Anesthesia Pain Rounding
Anesthesia Follow-Up Evaluation: Post-Procedure Day One    Name: Michele Benitez     MRN: [id number]     DOB: December 05, 1960     Age: 58 y.o.     Sex: female   __________________________________________________________________________     Procedure Date: 09/01/2018   Procedure: Procedure(s):  ALLOTRANSPLANTATION KIDNEY FROM NON LIVING DONOR WITHOUT RECIPIENT NEPHRECTOMY    Physical Assessment  Height: 167.6 cm (66)  Weight: 87.7 kg (193 lb 5.5 oz)    Vital Signs (Last Filed in 24 hours)  BP: 155/79 (06/12 0653)  Temp: 36.7 ???C (98.1 ???F) (06/12 4540)  Pulse: 98 (06/12 0653)  Respirations: 20 PER MINUTE (06/12 0653)  SpO2: 100 % (06/12 0653)  SpO2 Pulse: 70 (06/11 1500)    Patient History   Allergies  No Known Allergies     Medications  Scheduled Meds:acetaminophen (TYLENOL) tablet 650 mg, 650 mg, Oral, Q6H*  ceFAZolin (ANCEF) IVP 500 mg, 500 mg, Intravenous, Q12H*    Followed by  [START ON 09/03/2018] cephalexin (KEFLEX) capsule 500 mg, 500 mg, Oral, Q12H*  heparin (porcine) PF syringe 5,000 Units, 5,000 Units, Subcutaneous, Q8H  methylPREDNISolone (SOLU-MEDROL PF) injection 120 mg, 120 mg, Intravenous, QDAY    Followed by  [START ON 09/03/2018] methylPREDNISolone (SOLU-MEDROL PF) injection 80 mg, 80 mg, Intravenous, QDAY    Followed by  [START ON 09/04/2018] methylPREDNISolone (SOLU-Medrol) injection 40 mg, 40 mg, Intravenous, QDAY    Followed by  Melene Muller ON 09/05/2018] predniSONE (DELTASONE) tablet 30 mg, 30 mg, Oral, QDAY    Followed by  Melene Muller ON 09/06/2018] predniSONE (DELTASONE) tablet 20 mg, 20 mg, Oral, QDAY    Followed by  Melene Muller ON 09/07/2018] predniSONE (DELTASONE) tablet 15 mg, 15 mg, Oral, QDAY    Followed by  Melene Muller ON 09/08/2018] predniSONE (DELTASONE) tablet 10 mg, 10 mg, Oral, QDAY    Followed by  Melene Muller ON 09/09/2018] predniSONE (DELTASONE) tablet 5 mg, 5 mg, Oral, QDAY(12)  mycophenolate DR (MYFORTIC) tablet 720 mg, 720 mg, Oral, BID  nystatin (MYCOSTATIN) oral suspension 500,000 Units, 500,000 Units, Swish & Swallow, QID  oxyCODONE (ROXICODONE) tablet 5-10 mg, 5-10 mg, Oral, ONCE  pantoprazole (PROTONIX) injection 40 mg, 40 mg, Intravenous, QDAY  tacrolimus (PROGRAF) capsule 4.5 mg, 4.5 mg, Oral, BID(6-18)    Continuous Infusions:  ??? sodium chloride 0.45% (1/2NS) 1,000 mL with sodium bicarbonate 10 mEq IV infusion 100 mL/hr at 09/01/18 1956   ??? sodium chloride 0.9 %   infusion       PRN and Respiratory Meds:fentaNYL citrate PF Q1H PRN, ondansetron (ZOFRAN) IV Q6H PRN, oxyCODONE Q4H PRN      Diagnostic Tests  Hematology:   Lab Results   Component Value Date    HGB 8.4 09/02/2018    HCT 24.3 09/02/2018    PLTCT 99 09/02/2018    WBC 10.5 09/02/2018    NEUT 96 09/02/2018    ANC 10.15 09/02/2018    ALC 0.09 09/02/2018    MONA 2 09/02/2018    AMC 0.21 09/02/2018    EOSA 0 09/02/2018    ABC 0.09 09/02/2018    MCV 99.5 09/02/2018    MCH 34.3 09/02/2018    MCHC 34.4 09/02/2018    MPV 8.0 09/02/2018    RDW 19.1 09/02/2018         General Chemistry:   Lab Results   Component Value Date    NA 137 09/02/2018    K 5.2 09/02/2018    CL 102 09/02/2018    CO2 19 09/02/2018  GAP 16 09/02/2018    BUN 41 09/02/2018    CR 7.07 09/02/2018    GLU 97 09/02/2018    CA 7.3 09/02/2018    ALBUMIN 3.4 09/02/2018    MG 1.4 09/02/2018    TOTBILI 0.4 09/02/2018    PO4 5.7 09/02/2018      Coagulation:   Lab Results   Component Value Date    PTT 34.9 09/01/2018    INR 1.0 09/01/2018         Follow-Up Assessment  Patient location during evaluation: floor      Anesthetic Complications:   Anesthetic complications: The patient did not experience any anesthestic complications.      Pain:  Score: 8 (pain related to headache- nursing aware)    Management:inadequate     Level of Consciousness: awake and alert   Hydration:acceptable     Airway Patency: patent   Respiratory Status: acceptable and nasal cannula (2lpm)     Cardiovascular Status:acceptable   Regional/Neuroaxial:             Block complications: headache

## 2018-09-02 NOTE — Case Management (ED)
Case Management Admission Assessment  Transplant Phase  NAME:Michele Benitez                          MRN: [id number]             DOB:June 01, 1960          AGE: 58 y.o.  ADMISSION DATE: 09/01/2018             DAYS ADMITTED: LOS: 1 day      Today???s Date: 09/02/2018    Source of Information: Patient at bedside       Plan  Plan: Case Management Assessment, Transplant Assessment, Assist PRN with SW/NCM Services, Discharge Planning for Home Anticipated    This CM met with pt for assessment on this date.  Provided contact information and explanation of SW/NCM roles.  Reviewed [facility], Preparing for Discharge, and Preferred Provider Network hand-outs.  Provided opportunity for questions and discussion. Pt/family encouraged to contact Case Management team with questions and concerns during hospitalization and until patient is able to transition back to the patient's primary care physician.    SW met with pt and husband; Michele Benitez at bedside to verify demographic information and to discuss discharge plans. Pt reports being completely independent with all ADLs, stated Michele Benitez was providing transport to all appointments and to dialysis. Pt stated her and Michele Benitez will be staying at [facility] close to [facility] for minimum of two weeks. Pt also identified son; Michele Benitez as support if needed.     CM will continue to follow for discharge planning.   Needs undetermined at this time.     Patient Address/Phone  [address]  [address]  [place] [address]  [phone] (home)     Emergency Contact  Extended Emergency Contact Information  Primary Emergency Contact: Michele Benitez  Address: [address]           [place] [address] [place]  Home Phone: [phone]  Mobile Phone: [phone]  Relation: Spouse    Healthcare Directive  Healthcare Directive: No, patient does not have a healthcare directive  Would patient like to fill out a (a new) Healthcare Directive?: No, patient declined      Transportation Does the patient need discharge transport arranged?: No  Transportation Name, Phone and Availability #1: Husband Michele Benitez to provide transport     Expected Discharge Date  Expected Discharge Date: 09/05/18    Living Situation Prior to Admission  ? Living Arrangements  Living Arrangements: Spouse/significant other  Financial risk analyst / Tub: Psychologist, counselling  How many levels in the residence?: 1(lives on 6th floor of building uses elevator)  Can patient live on one level if needed?: Yes  Does residence have entry and/or side stairs?: No  Assistance needed prior to admit or anticipated on discharge: No  Who provides assistance or could if needed?: Husband; Michele Benitez  Are they in good health?: Yes  Can support system provide 24/7 care if needed?: Yes  ? Level of Function   Prior level of function: Independent  ? Cognitive Abilities   Cognitive Abilities: Alert and Oriented, Participates in decision making, Recognizes impact of health condition on lifestyle    Financial Resources  ? Coverage  Primary Insurance: Medicare  Secondary Insurance: Commercial insurance    ? Source of Income   Source Of Income: Unemployed  N/A    Psychosocial Needs  ? Mental Health  Mental Health History: No  ? Substance Use  History  Substance Use History Screen: No  ? Other  SW inquired how pt has been coping since Kidney Transplant Pt shouted:  Happy, and relieved. I feel like I can do anything now, expect eat and drink what I want. Pt stated she was very happy to hopefully have her last dialysis session on Tuesday of this week. Pt stated she has been waiting for a long time to have a Transplant.     Current/Previous Services  ? PCP  Michele Benitez, [phone], [phone]  ? Pharmacy    [facility] [address], [place] [address] [place] [address] [place] [address]  [place] [address]  Phone: [phone] Fax: [phone]    ? Durable Medical Equipment   Durable Medical Equipment at home: Other (comment)  ? Home Health  Receiving home health: No ? Hemodialysis or Peritoneal Dialysis  Undergoing hemodialysis or peritoneal dialysis: Yes  Hemodialysis or Peritoneal Dialysis: Hemodialysis  Location: [facility]   Days attending: Tuesday, Thursday, Saturday  Transportation to treatment via: Other  ? Tube/Enteral Feeds  Receive tube/enteral feeds: No  ? Infusion  Receive infusions: No  ? Private Duty  Private duty help used: No  ? Home and Community Based Services  Home and community based services: No  ? [employer]: N/A  ? Hospice  Hospice: No  ? Outpatient Therapy  PT: No  OT: No  SLP: No  ? Skilled Nursing Facility/Nursing Home  SNF: No  NH: No  ? Inpatient Rehab  IPR: No  ? Long-Term Acute Care Hospital  LTACH: No  ? Acute Hospital Stay  Acute Hospital Stay: No    Charleston Ropes, LMSW  [phone]

## 2018-09-02 NOTE — Progress Notes
70- Dr. Wynelle Fanny notified of VS and UOP. Per MD, RN to increase IVF to 128mL per hour. MD to order bolus.   2005- Bolus completed. Dr. Wynelle Fanny notified of post bolus BP, and that recheck would be at 2030. No new orders.   2030 - Dr. Wynelle Fanny notified of hourly UOP and VS. MD also notified no post op BMP was drawn in PACU at 1530. Per MD, RN to spec in lab BMP from labs drawn at [facility].   2130-Dr. Briscoe notified of 2130 VS and UOP. No new orders  2230- Dr. Wynelle Fanny notified of hourly VS and UOP. No new orders.   2340- Dr. Wynelle Fanny notified of hourly VS and UOP. No new orders.   0100- Dr. Wynelle Fanny notified of hourly VS and UOP. No new orders.   0200- Dr. Wynelle Fanny notified of hourly VS, UOP, and slight increase in oxygen demand. No new orders.   15- Dr. Wynelle Fanny notified of hourly VS, UOP.  0400-Dr. Marlor notified that hourly VS will be ending on patient. No new orders.

## 2018-09-02 NOTE — Progress Notes
PHYSICAL THERAPY  ASSESSMENT      Name: Michele Benitez        MRN: [id number]          DOB: [birth year]          Age: 58 y.o.  Admission Date: 09/01/2018             LOS: 1 day      Mobility  Patient Turn/Position: Supine  Progressive Mobility Level: Walk in hallway  Distance Walked (feet): 100 ft  Level of Assistance: Assist X2  Assistive Device: Hand Held(IV pole)  Time Tolerated: 11-30 minutes  Activity Limited By: Weakness    Subjective  Significant hospital events: 58 year old female with ESRD secondary to HTN who presents for DDRT  Mental / Cognitive Status: Alert;Oriented;Cooperative;Follows Commands  Persons Present: Occupational Therapist  Pain: Patient complains of pain;Patient does not rate pain;During activity  Pain Location: Post-surgical  Pain Description: Aching  Pain Interventions: Patient agrees to participate in therapy with modifications to session  Comments: Foley, JP, VAC  Patient Owned Equipment: None  Home Situation: Lives with Family  Type of Home: Apartment  Entry Stairs: No Stairs  In-Home Stairs: Elevator    ROM  LE ROM WFL: Yes    Strength  Overall Strength: Generalized Weakness    Bed Mobility/Transfer  Bed Mobility: Supine to Sit: Minimal Assist;Head of Bed Elevated  Bed Mobility: Sit to Supine: Standby Assist  Transfer Type: Sit to/from Stand  Transfer: Assistance Level: To/From;Bed;Minimal Assist  Transfer: Assistive Device: Chief of Staff Assist  Transfers: Type Of Assistance: Verbal Cues  End Of Activity Status: In Bed;Nursing Notified;Instructed Patient to Use Call Light;Instructed Patient to Request Assist with Mobility(bed alarm on)    Gait  Gait Distance: 100 feet  Gait: Assistance Level: Minimal Assist;x2 People;Management of Lines;Safety Considerations  Gait: Assistive Device: Hand Hold Assist;IV Pole  Gait: Descriptors: Antalgic;Pace: Slow;Forward trunk flexion;No balance loss;Decreased step length  Activity Limited By: Patient Choice;Complaint of Pain    Activity/Exercise Sit Edge Of Bed: 5 minutes  Sit Edge Of Bed Assist: Stand By Assist    Education  Persons Educated: Patient/Family  Patient Barriers To Learning: None Noted  Teaching Methods: Verbal Instruction  Patient Response: Verbalized Understanding  Topics: Plan/Goals of PT Interventions;Use of Assistive Device/Orthosis;Mobility Progression;Precautions;Safety Awareness;Up with Assist Only;Importance of Increasing Activity;Ambulate With Nursing;Recommend Continued Therapy    Assessment/Progress  Impaired Mobility Due To: Post Surgical Changes  Assessment/Progress: Should Improve w/ Continued PT    AM-PAC 6 Clicks Basic Mobility Inpatient  Turning from your back to your side while in a flat bed without using bed rails: A Little  Moving from lying on your back to sitting on the side of a flatbed without using bedrails : A Little  Moving to and from a bed to a chair (including a wheelchair): A Little  Standing up from a chair using your arms (e.g. wheelchair, or bedside chair): A Little  To walk in hospital room: A Little  Climbing 3-5 steps with a railing: A Little  Raw Score: 18  Standardized (T-scale) Score: 41.05  Basic Mobility CMS 0-100%: 40.47  CMS G Code Modifier for Basic Mobility: CK    Goals  Goal Formulation: With Patient  Time For Goal Achievement: 2 days, To, 5 days  Patient Will Go Supine To/From Sit: Independently  Patient Will Transfer Bed/Chair: Independently  Patient Will Transfer Sit to Stand: Independently  Patient Will Ambulate: Greater than 200 Feet, w/ No Device, Independently  Patient Will  Go Up / Down Stairs: 1-2 Stairs, w/ Stand By Assist    Plan  Treatment Interventions: Mobility Training;Endurance Training;Family Training  Plan Frequency: 5 Days per Week  PT Plan for Next Visit: Trial ambulation with RW (left in room RN aware). Increase ambulation distance    PT Discharge Recommendations  Recommendation: Home with intermittent supervision/assistance Recommendation for Therapy Post Discharge: Home health  Patient Currently Requires Supervision For: Mobility  Patient Currently Requires Equipment: None    Therapist  Lucky Cowboy, PT  Date  09/02/2018

## 2018-09-02 NOTE — Progress Notes
PHYSICAL THERAPY  NOTE      Name: Michele Benitez        MRN: [id number]          DOB: 03-22-1961          Age: 58 y.o.  Admission Date: 09/01/2018             LOS: 1 day      Attempted to see patient this afternoon, phlebotomist with patient at this time,  will check back later.      Therapist: Delene Ruffini, PT  Date: 09/02/2018

## 2018-09-02 NOTE — Care Plan
Problem: Infection, Risk of, Urinary Catheter-Associated Urinary Tract Infection  Goal: Absence of urinary catheter-associated infection  Outcome: Goal Ongoing  Flowsheets (Taken 09/02/2018 1205)  Absence of urinary catheter-associated infections:   Manage urinary catheter   Assess for signs and sypmtoms of catheter-associated urinary tract infection   Provide patient/family education on CAUTI prevention     Problem: Infection, Risk of  Goal: Absence of infection  Outcome: Goal Ongoing  Flowsheets (Taken 09/02/2018 1205)  Absence of infection:   Assess for infection (Monitor SIRS Criteria)   Monitor for signs and symptoms of infection   Administer pharmacological therapies as ordered   Implement prevention measures as indicated  Goal: Knowledge of Infection Control Procedures  Outcome: Goal Ongoing     Problem: Falls, High Risk of  Goal: Absence of falls-Adult Patient  Outcome: Goal Ongoing  Flowsheets (Taken 09/02/2018 1205)  Absence of falls-Adult Patient:   Complete Fall Risk Assessment.   Provde safe environment.   Provide safe ambulation.   Implement fall risk bundle.   Provide fall prevention strategies.     Problem: Discharge Planning  Goal: Participation in plan of care  Outcome: Goal Ongoing  Flowsheets (Taken 09/02/2018 1205)  Participation in Plan of Care: Involve patient/caregiver in care planning decision making  Goal: Knowledge regarding plan of care  Outcome: Goal Ongoing  Flowsheets (Taken 09/02/2018 1205)  Knowledge regarding plan of care:   Provide plan of care education   Provide fall prevention education   Provide medication management education  Goal: Prepared for discharge  Outcome: Goal Ongoing  Flowsheets (Taken 09/02/2018 1205)  Prepared for discharge: Collaborate with multidisciplinary team for hospital discharge coordination     Problem: Pain  Goal: Management of pain  Outcome: Goal Ongoing  Flowsheets (Taken 09/02/2018 1205)  Management of pain: Complete pain assessment scale according to age, condition and ability to understand.   Manage pain.   Assess opioid analgesia side-effects.   Assess pain control barriers.   In the patient who can fully report pain, assess pain characteristics.  Goal: Knowledge of pain management  Outcome: Goal Ongoing  Flowsheets (Taken 09/02/2018 1205)  Knowledge of pain management:   Provide pain scale education   Provide pain management methods education   Provide pharmacological pain management education  Goal: Progress Toward Pain Management Goals  Outcome: Goal Ongoing  Flowsheets (Taken 09/02/2018 1205)  Progress toward pain management goals:   Progress toward pain management goals   Assess progress toward pain management goals     Problem: Skin Integrity  Goal: Healing of skin (Wound & Incision)  Outcome: Goal Ongoing  Flowsheets (Taken 09/02/2018 1205)  Healing of wound (wounds and Incisions):   Assess for signs and symptoms of wound infection   Assess wound site healing

## 2018-09-02 NOTE — Progress Notes
Transplant Phase    Patient is POD 1, last BM prior to admission, Foley in place, PT/OT consulted, patient's Pain is controlled with current analgesics.  Medication(s) being used:   narcotic analgesics including  IVP Fentanyl for pain control, diet NPO . Patient not ready for discharge.     Patient's medication costs are as follows:  Tacrolimus: $0  Mycophonolate: $0  Valganciclovir: $0    Patient expressed no concern regarding costs of medications.     [place] coordinator met with patient, introduced self and explained role. Gave patient kidney pillow and green binder. Asked patient to review information in binder. Discussed when support person would be available for teaching, made plan for education on Monday, patient's husband at bedside.     Gaylyn Rong, RN  Pager [phone]

## 2018-09-02 NOTE — Progress Notes
OCCUPATIONAL THERAPY  ASSESSMENT NOTE      Name: Michele Benitez        MRN: [id number]          DOB: [birth year]          Age: 58 y.o.  Admission Date: 09/01/2018             LOS: 1 day    Mobility  Patient Turn/Position: Supine  Progressive Mobility Level: Walk in hallway  Distance Walked (feet): 100 ft  Level of Assistance: Assist X2  Assistive Device: Hand Held(IV pole)  Time Tolerated: 11-30 minutes  Activity Limited By: Weakness    Subjective  Pertinent Dx per Physician:  58 y.o. 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 After session, patient back in bed, alarm on, and all needs in reach.     Objective  Psychosocial Status: Willing and Cooperative to Participate  Persons Present: Physical Therapist;Spouse    Home Living  Type of Home: Apartment  Home Layout: Elevator;Performs ADL'S on One Level  Financial risk analyst / Tub: Pension scheme manager: Standard  Bathroom Equipment: Engineer, materials in Air traffic controller    Prior Function  Level Of Independence: Independent with ADLs and functional transfers;Independent with homemaking w/ ambulation  Lives With: Spouse  Receives Help From: None Needed  Other Function Comments: Reports independence; no fall history. Reports spouse will be with her 24/7 at discharge     Vision  Current Vision: No Visual Deficits  Comment: Reports no visual changes.     ADL's  Where Assessed: Edge of Bed  Toileting Assist: Total Assist    ADL Mobility  Bed Mobility: Supine to Sit: Minimal assist  Bed Mobility: Sit to Supine: Standby assist    Transfer Type: Sit to stand  Transfer: Assistance Level: Minimal assist;To/from;Bed  Transfer: Assistive Device: Hand hold assist  Transfer: Type of Assistance: For safety considerations    End of Activity Status: In bed;Instructed patient to use call light;Nursing notified    Sitting Balance: Standby assist  Standing Balance: Minimal assist;2 UE support    Gait Distance: 100 feet  Gait: Assistance Level: Minimal assist;x2 people  Gait: Assistive Device: IV pole;Hand hold assist  Gait Comments: Patient ambulates with one hand on IV pole and second on therapist while other therapist managing lines. Walker put in patient's room for future mobility.     Activity Tolerance  Endurance: 3/5 Tolerates 25-30 Minutes Exercise w/Multiple Rests    Cognition  Expression: Increased Time for Expression  Social Interaction: Interacts in a Spontaneous,Cooperative Manner  Problem Solving: Decreased Judgment/Safety  Memory: WFL Adequate to Recall Day to Day Activities  Attention: Awake/Alert    UE AROM  Overall BUE AROM WNL: Yes    Sensory  Overall Sensory: Bilateral Intact    UE Strength / Tone  Overall Strength / Tone: WFL Able to Perform ADL Tasks    Education  Persons Educated: Patient/Family  Barriers To Learning: None Noted  Teaching Methods: Verbal Instruction  Patient Response: Verbalized Understanding  Topics: Role of OT, Goals for Therapy  Goal Formulation: With Patient    Assessment  Assessment: Decreased ADL Status;Decreased Endurance;Decreased Self-Care Trans;Decreased High-Level ADLs  Prognosis: Good  Goal Formulation: Patient  Comments: Patient is limited by post-op decreased endurance, impaired balance, and lines limiting independent participation  in ADLs and related mobility. Anticipate patient will continue to progress while admitted for safe discharge home with spouse assist.     Plan  OT Frequency: 5x/week  OT Plan for Next Visit: LE dressing, sink ADLs, toileting    Further Evaluation Goals  Pt Will Tolerate Further ADL Evaluation: w/in1-2 sessions    ADL Goals  Patient Will Perform All ADL's: w/ Stand By Assist    Functional Transfer Goals  Pt Will Perform All Functional Transfers: w/ Stand By Assist    OT Discharge Recommendations Recommendation: Home with consistent supervision/assistance(Anticipated)  Patient Currently Requires Physical Assist With: All mobility;All personal care ADLs;All home functioning ADLs    Therapist: Debbora Dus, OTR/L [id number]  Date: 09/02/2018

## 2018-09-02 NOTE — Progress Notes
8206 - Notified Dr Wynelle Fanny pt 's BP 105/57 HR 79.  NS 569ml bolus ordered.  Call MD with recheck when bolus complete passed on to night nurse.

## 2018-09-02 NOTE — Progress Notes
PHYSICAL THERAPY  NOTE      Name: Michele Benitez        MRN: [id number]          DOB: 09-May-1960          Age: 58 y.o.  Admission Date: 09/01/2018             LOS: 1 day      Orders received, chart reviewed.  Spoke with RN, patient currently on bedrest.  Will continue to monitor and eval when able.     Therapist: Delene Ruffini, PT  Date: 09/02/2018

## 2018-09-02 NOTE — Progress Notes
Patient arrived to room # [id number] via cart accompanied by RN. Patient transferred to the bed with assistance. Bedside safety checks completed. Initial patient assessment completed. Refer to flowsheet for details.    Admission skin assessment completed with: Eunice Blase RN    Pressure injury present on arrival?: No    1. Head/Face/Neck: No  2. Trunk/Back: No  3. Upper Extremities: No  4. Lower Extremities: No  5. Pelvic/Coccyx: No  6. Assessed for device associated injury? Yes  7. Malnutrition Screening Tool (Nursing Nutrition Assessment) Completed? No    See Doc Flowsheet for additional wound details.     INTERVENTIONS:

## 2018-09-03 MED ORDER — FUROSEMIDE 10 MG/ML IJ SOLN
100 mg | Freq: Once | INTRAVENOUS | 0 refills | Status: CP
Start: 2018-09-03 — End: ?
  Administered 2018-09-03: 17:00:00 100 mg via INTRAVENOUS

## 2018-09-03 MED ORDER — LYMPHOCYTE IMMUNE GLOBULIN (RAB) IVPB (PERIPHERAL)
Freq: Once | INTRAVENOUS | 0 refills | Status: CP
Start: 2018-09-03 — End: ?
  Administered 2018-09-03 (×4): 516.400 mL via INTRAVENOUS

## 2018-09-03 MED ORDER — ALUMINUM-MAGNESIUM HYDROXIDE 200-200 MG/5 ML PO SUSP
30 mL | Freq: Once | ORAL | 0 refills | Status: CP
Start: 2018-09-03 — End: ?
  Administered 2018-09-03: 07:00:00 30 mL via ORAL

## 2018-09-03 MED ORDER — TACROLIMUS 1 MG PO CAP
4 mg | Freq: Two times a day (BID) | ORAL | 0 refills | Status: DC
Start: 2018-09-03 — End: 2018-09-05
  Administered 2018-09-03 – 2018-09-05 (×4): 4 mg via ORAL

## 2018-09-03 NOTE — Progress Notes
Dr. Lucillie Garfinkel informed pt had 7 beats of V-Tach, pt denied chest pain at time. No new order at this time.

## 2018-09-03 NOTE — Progress Notes
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  Donor: DCD 6th decade of life, Ht 160 cm. Wt 85 kg. COD: Cardiovascular/Anoxia, Creatinine Initial/Terminal. 1/1.24  Right kidney- 2% GS. Mild moderate IF and mild vascular chronicity   KDPI: 75%, cPRA:  0%, Historical cPRA 17%  Match: XM -ve, no DSA  CMV: D+/R+  Induction: thymoglobulin  Maintenance Immunosuppression: Triple drug therapy  Mild injury to renal artery with anastomosis, expect velocities to be high- started on ASA post op.   Post OP: tbd, CIT: 16 Hr     WIT:  35 Min  Studies: None    CC:   ESRD  S/P DDRT     Subjective:   Feels well today, incision pain is under control   No n/v/d, no chest pain or sob     REVIEW OF SYSTEMS: Comprehensive 14-point ROS reviewed  Positives noted above otherwise negative.    Allergies:   No Known Allergies    Problem List:  Patient Active Problem List    Diagnosis Date Noted   ??? Kidney transplant candidate 09/01/2018   ??? End stage kidney disease (HCC) 08/28/2018   ??? ESRD (end stage renal disease) (HCC) 12/03/2015   ??? Abnormal stress test 12/03/2015   ??? Dialysis patient (HCC) 11/19/2015   ??? Pre-transplant evaluation for ESRD (end stage renal disease) 07/19/2014   ??? Chronic kidney disease 07/18/2014   ??? Hypertension 07/18/2014   ??? Dyslipidemia 07/18/2014   ??? Hyperphosphatemia 07/18/2014       Current Medications:    Current Facility-Administered Medications:   ???  acetaminophen (TYLENOL) tablet 650 mg, 650 mg, Oral, Q6H PRN, Archie Balboa, APRN-NP, 650 mg at 09/02/18 2228  ???  aspirin chewable tablet 81 mg, 81 mg, Oral, QDAY, Archie Balboa, APRN-NP, 81 mg at 09/03/18 6213  ???  bisacodyL (DULCOLAX) rectal suppository 10 mg, 10 mg, Rectal, QDAY PRN, Archie Balboa, APRN-NP  ???  butalbital/acetaminophen/caffeine (FIORICET) 50/325/40 mg (+) tablet 1-2 tablet, 1-2 tablet, Oral, Q4H PRN, Archie Balboa, APRN-NP, 1 tablet at 09/02/18 1246  ???  [COMPLETED] ceFAZolin (ANCEF) IVP 500 mg, 500 mg, Intravenous, Q12H*, 500 mg at 09/03/18 0829 **FOLLOWED BY** cephalexin (KEFLEX) capsule 500 mg, 500 mg, Oral, Q12H*, Liebscher, Lucienne Capers., MD  ???  epoetin alfa (PROCRIT) injection 4,000 Units, 4,000 Units, Subcutaneous, Once per day on Mon Wed Fri, Lapointe, Karli J, APRN-NP, 4,000 Units at 09/02/18 2017  ???  heparin (porcine) PF syringe 5,000 Units, 5,000 Units, Subcutaneous, Q8H, Liebscher, Lucienne Capers., MD, 5,000 Units at 09/03/18 0549  ???  lymphocyte immune globulin, rabbit (THYMOGLOBULIN) 75 mg, hydrocortisone PF (Solu-CORTEF) 20 mg, heparin (porcine) 1,000 Units in sodium chloride 0.9% (NS) 516.4 mL IVPB, , Intravenous, ONCE, Corena Herter, MD, Last Rate: 86.1 mL/hr at 09/03/18 1048  ???  [COMPLETED] methylPREDNISolone (SOLU-MEDROL PF) injection 120 mg, 120 mg, Intravenous, QDAY, 120 mg at 09/02/18 1512 **FOLLOWED BY** [COMPLETED] methylPREDNISolone (SOLU-MEDROL PF) injection 80 mg, 80 mg, Intravenous, QDAY, 80 mg at 09/03/18 0829 **FOLLOWED BY** [START ON 09/04/2018] methylPREDNISolone (SOLU-Medrol) injection 40 mg, 40 mg, Intravenous, QDAY **FOLLOWED BY** [START ON 09/05/2018] predniSONE (DELTASONE) tablet 30 mg, 30 mg, Oral, QDAY **FOLLOWED BY** [START ON 09/06/2018] predniSONE (DELTASONE) tablet 20 mg, 20 mg, Oral, QDAY **FOLLOWED BY** [START ON 09/07/2018] predniSONE (DELTASONE) tablet 15 mg,  15 mg, Oral, QDAY **FOLLOWED BY** [START ON 09/08/2018] predniSONE (DELTASONE) tablet 10 mg, 10 mg, Oral, QDAY **FOLLOWED BY** [START ON 09/09/2018] predniSONE (DELTASONE) tablet 5 mg, 5 mg, Oral, QDAY(12), Lapointe, Karli J, APRN-NP  ???  mycophenolate DR (MYFORTIC) tablet 720 mg, 720 mg, Oral, BID, Liebscher, Sean C., MD, 720 mg at 09/03/18 1610  ???  nystatin (MYCOSTATIN) oral suspension 500,000 Units, 500,000 Units, Swish & Swallow, QID, Liebscher, Lucienne Capers., MD, 500,000 Units at 09/03/18 1217  ???  ondansetron (ZOFRAN) injection 4-8 mg, 4-8 mg, Intravenous, Q6H PRN, Liebscher, Sean C., MD  ???  oxyCODONE (ROXICODONE) tablet 5-15 mg, 5-15 mg, Oral, Q4H PRN, Liebscher, Sean C., MD  ???  pantoprazole (PROTONIX) injection 40 mg, 40 mg, Intravenous, QDAY, Liebscher, Lucienne Capers., MD, 40 mg at 09/03/18 0829  ???  polyethylene glycol 3350 (MIRALAX) packet 17 g, 1 packet, Oral, QDAY, Archie Balboa, APRN-NP, 17 g at 09/03/18 0829  ???  senna/docusate (SENOKOT-S) tablet 2 tablet, 2 tablet, Oral, BID, Archie Balboa, APRN-NP, 2 tablet at 09/03/18 9604  ???  tacrolimus (PROGRAF) capsule 4 mg, 4 mg, Oral, BID(6-18), Corena Herter, MD    Physical Exam:  BP (!) 150/86 (BP Source: Arm, Right Upper)  - Pulse 95  - Temp 36.4 ???C (97.6 ???F)  - Ht 167.6 cm (66)  - Wt 91 kg (200 lb 9.6 oz)  - SpO2 93%  - BMI 32.38 kg/m???     Intake/Output Summary (Last 24 hours) at 09/03/2018 1315  Last data filed at 09/03/2018 1159  Gross per 24 hour   Intake 298 ml   Output 4650 ml   Net -4352 ml     General: In NAD; A&Ox3  Skin: Warm, dry, no signs of rash   Neck: Supple   CV: Regular, regular rate, normal S1/S2, no murmurs  Lungs: CTA bilaterally in posterior fields  : Grossly normal without rebound, guarding, masses, or bruits   Transplant: No overlying bruit, tender, incision dressing in place - clean, dry, and intact, + Vac drain, + JP drain   Ext: No edema  Neuro: No focal deficits   Psych: Affect appropriate  Dialysis Access: + AVF good thrill/bruit    Laboratory studies:     CMP:  CMP Latest Ref Rng & Units 09/03/2018 09/02/2018 09/02/2018 09/01/2018 09/01/2018   NA 137 - 147 MMOL/L 138 135(L) 137 138 137   K 3.5 - 5.1 MMOL/L 4.5 4.7 5.2(H) 4.3 4.2   CL 98 - 110 MMOL/L 100 100 102 104 101   CO2 21 - 30 MMOL/L 15(L) 16(L) 19(L) 18(L) 20(L)   GAP 3 - 12 23(H) 19(H) 16(H) 16(H) 16(H)   BUN 7 - 25 MG/DL 54(U) 98(J) 19(J) 47(W) 34(H)   CR 0.4 - 1.00 MG/DL 2.95(A) 2.13(Y) 8.65(H) 7.04(H) 6.80(H)   GLUX 70 - 100 MG/DL 846(N) 629(B) 97 90 96 CA 8.5 - 10.6 MG/DL 9.1 7.9(L) 7.3(L) 7.3(L) 7.2(L)   TP 6.0 - 8.0 G/DL 6.2 2.8(U) 5.2(L) - -   ALB 3.5 - 5.0 G/DL 3.8 1.3(K) 4.4(W) - -   ALKP 25 - 110 U/L 72 55 40 - -   ALT 7 - 56 U/L 5(L) 5(L) 6(L) - -   TBILI 0.3 - 1.2 MG/DL 0.5 0.5 0.4 - -   GFR >10 mL/min 7(L) 6(L) 6(L) 6(L) 6(L)   GFRAA >60 mL/min 8(L) 7(L) 7(L) 7(L) 8(L)     Hemoglobin A1C (%)   Date Value   09/01/2018 5.6   08/28/2018  5.6   10/28/2016 5.4     PTH Hormone (PG/ML)   Date Value   09/01/2018 90.2 (H)   08/28/2018 64.5     Lipase   Date Value Ref Range Status   11/21/2013 72 11 - 82 U/L Final     Amylase   Date Value Ref Range Status   11/21/2013 56 24 - 100 U/L Final     No results found for: BKDNAQT  No results found for: CMVDNAPCR  No results found for: IUMLBLD  No results found for: COPIES  No results found for: EBVDNAQT    TACROLIMUS LEVEL:  Tacrolimus (NG/ML)   Date Value   09/03/2018 26.3 (HH)       CBC with Diff:  CBC with Diff Latest Ref Rng & Units 09/03/2018 09/02/2018   WBC 4.5 - 11.0 K/UL 6.3 10.8   RBC 4.0 - 5.0 M/UL 2.48(L) 2.44(L)   HGB 12.0 - 15.0 GM/DL 1.6(X) 0.9(U)   HCT 36 - 45 % 24.5(L) 23.9(L)   MCV 80 - 100 FL 99.0 98.0   MCH 26 - 34 PG 34.3(H) 33.4   MCHC 32.0 - 36.0 G/DL 04.5 40.9   RDW 11 - 15 % 19.3(H) 19.9(H)   PLT 150 - 400 K/UL 62(L) 67(L)   MPV 7 - 11 FL 8.7 8.4   NEUT 41 - 77 % 95(H) 97(H)   ANC 1.8 - 7.0 K/UL 5.95 10.62(H)   LYMA 24 - 44 % 2(L) 1(L)   ALYM 1.0 - 4.8 K/UL 0.14(L) 0.10(L)   MONA 4 - 12 % 2(L) 2(L)   AMONO 0 - 0.80 K/UL 0.13 0.20   EOSA 0 - 5 % 1 0   AEOS 0 - 0.45 K/UL 0.04 0.00   BASA 0 - 2 % 0 0   ABAS 0 - 0.20 K/UL 0.00 0.01     Lab Results   Component Value Date/Time    IRON 61 09/03/2018 05:42 AM    TIBC 252 (L) 09/03/2018 05:42 AM    PSAT 24 (L) 09/03/2018 05:42 AM    FERRITIN 3,526 (H) 09/03/2018 05:42 AM       Urinalysis:  Lab Results   Component Value Date/Time    UCOLOR STRAW 09/01/2018 07:01 AM    TURBID CLEAR 09/01/2018 07:01 AM    USPGR 1.016 09/01/2018 07:01 AM UPH 8.0 09/01/2018 07:01 AM    UPROTEIN 2+ (A) 09/01/2018 07:01 AM    UAGLU 1+ (A) 09/01/2018 07:01 AM    UKET NEG 09/01/2018 07:01 AM    UBILE NEG 09/01/2018 07:01 AM    UBLD NEG 09/01/2018 07:01 AM    UROB NORMAL 09/01/2018 07:01 AM     No results found for: [id number]    Imaging:  Results for orders placed during the hospital encounter of 10/29/15   CT /PELV WO CONTRAST    Impression 1. Minimal aortoiliac calcific plaque.  2. Progression of bilateral renal atrophy and cortical scarring. No hydronephrosis.         Approved by Marcelline Mates, M.D. on 10/29/2015 2:00 PM    By my electronic signature, I attest that I have personally reviewed the images for this examination and formulated the interpretations and opinions expressed in this report       Finalized by Caro Hight, M.D. on 10/29/2015 4:53 PM. Dictated by Marcelline Mates, M.D. on 10/29/2015 12:59 PM.       Results for orders placed during the hospital encounter of 09/01/18   CHEST 2 VIEWS  Impression 1.  No  consolidation or pleural effusion  2.  Persistent nodule seen anteriorly on the lateral projection, which may represent a calcified nodule. Recommend nonemergent noncontrast CT chest to evaluate for underlying noncalcified pulmonary or mediastinal nodule. This was previously communicated to the clinical team.    By my electronic signature, I attest that I have personally reviewed the images for this examination and formulated the interpretations and opinions expressed in this report       Finalized by Delmar Landau, M.D. on 09/01/2018 11:46 AM. Dictated by Gwinda Passe, MD on 09/01/2018 10:57 AM.         Patient Active Problem List    Diagnosis Date Noted   ??? Kidney transplant candidate 09/01/2018   ??? End stage kidney disease (HCC) 08/28/2018   ??? ESRD (end stage renal disease) (HCC) 12/03/2015   ??? Abnormal stress test 12/03/2015   ??? Dialysis patient (HCC) 11/19/2015   ??? Pre-transplant evaluation for ESRD (end stage renal disease) 07/19/2014 ??? Chronic kidney disease 07/18/2014   ??? Hypertension 07/18/2014   ??? Dyslipidemia 07/18/2014   ??? Hyperphosphatemia 07/18/2014       Cardiovascular Screening   Echocardiogram: Aug/2019 EF 60%  NM Stress Test: 2016 AV Block   Left Heart Catheterization: Date 2017, Findings: No significant angiographic coronary artery stenosis    Cancer Screening  Colonoscopy: 2017,Ulcer/Polyp, biopsy no malignancy   Mammogram: Date Jan/2020 - normal     PAP: Date 2016 - normal    Assessment and Plan:  Ms. Godley with history of ESRD 2/2 HTN s/p a renal transplant for which we are consulted. cPRA 0%, KDPI 75%    #) Immunosuppression - On maintenance Triple drug therapy  - Thymo 150/100/125  - Goal tacrolimus trough first 3 months after transplant is 12-17 ng/mL (MEIA) or 8-12 mcg/L (HPLC/LCMS).  -  Will reduce Prograf to 4 mg BID, level above goal   - MPA 720mg  BID and steroid pulse to wean to prednisone 5 mg daily.    #) Deceased donor renal transplant: bl Cr tbd mg/dL, bl UPCR tbd gm/gm  - Renal graft function: excellent uop, along with modest improvement in scr   - Expect SGF with DCD kidney   - No indication for HD   - Okay to give lasix today      #) ID Proph  - CMV Donor(+)/ Recipient(+) to treat with Valcyte for 3 months post transplant  - PJP Prophylax: To treat with Bactrim/Septra for 1 year post transplant  - Nystatin swish for 2 weeks post transplant    #) BPs: Fair control, monitor     #) Glycemia - BGs have been under Good control    #) Anemia - Hgb average of 8  - Iron stores well replete   - Continue EPO    #) Volume/Electrolytes:  - Hyperphosphatemia: will improve gradually      Assalam, MD  [facility]  Service Pager: [phone]

## 2018-09-03 NOTE — Progress Notes
@  0100 Dr. Wynelle Fanny informed pt's heart rhythm has been sinus tach 100's with non-sustained- 120's. Pt denied chest pain at time. To call Dr. Wynelle Fanny if pt's HR sustains in 120's order received.    @0207  Dr. Wynelle Fanny informed pt c/o indigestion and took Pepcid AC at home. Pt will take Maalox if it's available. Maalox x1 order received. Maalox x1 order received.

## 2018-09-03 NOTE — Care Plan
Problem: Infection, Risk of, Urinary Catheter-Associated Urinary Tract Infection  Goal: Absence of urinary catheter-associated infection  Outcome: Goal Ongoing  Flowsheets (Taken 09/02/2018 1205)  Absence of urinary catheter-associated infections:   Manage urinary catheter   Assess for signs and sypmtoms of catheter-associated urinary tract infection   Provide patient/family education on CAUTI prevention     Problem: Infection, Risk of  Goal: Absence of infection  Outcome: Goal Ongoing  Flowsheets (Taken 09/02/2018 1205)  Absence of infection:   Assess for infection (Monitor SIRS Criteria)   Monitor for signs and symptoms of infection   Administer pharmacological therapies as ordered   Implement prevention measures as indicated  Goal: Knowledge of Infection Control Procedures  Outcome: Goal Ongoing     Problem: Falls, High Risk of  Goal: Absence of falls-Adult Patient  Outcome: Goal Ongoing  Flowsheets (Taken 09/02/2018 1205)  Absence of falls-Adult Patient:   Complete Fall Risk Assessment.   Provde safe environment.   Provide safe ambulation.   Implement fall risk bundle.   Provide fall prevention strategies.     Problem: Discharge Planning  Goal: Participation in plan of care  Outcome: Goal Ongoing  Flowsheets (Taken 09/02/2018 1205)  Participation in Plan of Care: Involve patient/caregiver in care planning decision making  Goal: Knowledge regarding plan of care  Outcome: Goal Ongoing  Flowsheets (Taken 09/02/2018 1205)  Knowledge regarding plan of care:   Provide plan of care education   Provide fall prevention education   Provide medication management education  Goal: Prepared for discharge  Outcome: Goal Ongoing  Flowsheets (Taken 09/03/2018 1238)  Prepared for discharge:   Provide safe use medical equipment education   Collaborate with multidisciplinary team for hospital discharge coordination     Problem: Pain  Goal: Management of pain  Outcome: Goal Ongoing  Flowsheets (Taken 09/02/2018 1205) Management of pain:   Complete pain assessment scale according to age, condition and ability to understand.   Manage pain.   Assess opioid analgesia side-effects.   Assess pain control barriers.   In the patient who can fully report pain, assess pain characteristics.  Goal: Knowledge of pain management  Outcome: Goal Ongoing  Flowsheets (Taken 09/02/2018 1205)  Knowledge of pain management:   Provide pain scale education   Provide pain management methods education   Provide pharmacological pain management education  Goal: Progress Toward Pain Management Goals  Outcome: Goal Ongoing  Flowsheets (Taken 09/02/2018 1205)  Progress toward pain management goals:   Progress toward pain management goals   Assess progress toward pain management goals     Problem: Skin Integrity  Goal: Healing of skin (Wound & Incision)  Outcome: Goal Ongoing  Flowsheets (Taken 09/02/2018 1205)  Healing of wound (wounds and Incisions):   Assess for signs and symptoms of wound infection   Assess wound site healing     Problem: Nutrition Deficit  Goal: Adequate nutritional intake  Outcome: Goal Ongoing  Flowsheets (Taken 09/03/2018 1238)  Adequate nutritional intake:   Knowledge of nutritional diet   Assess nutritional status   Promote oral fluid intake     Problem: Self-Care Deficit  Goal: Maximize ADL functioning  Outcome: Goal Ongoing     Problem: Mobility/Activity Intolerance  Goal: Maximize functional ADL's and mobility outcomes  Outcome: Goal Ongoing  Flowsheets (Taken 09/03/2018 1238)  Maximize functional ADLs and mobility outcomes:   Manage environmental safety   Physical thrapy evaluation and treatment   Occupational therapy evaulation and treatment

## 2018-09-04 MED ORDER — FUROSEMIDE 10 MG/ML IJ SOLN
60 mg | Freq: Once | INTRAVENOUS | 0 refills | Status: CP
Start: 2018-09-04 — End: ?
  Administered 2018-09-04: 18:00:00 60 mg via INTRAVENOUS

## 2018-09-04 NOTE — Progress Notes
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  Donor: DCD 6th decade of life, Ht 160 cm. Wt 85 kg. COD: Cardiovascular/Anoxia, Creatinine Initial/Terminal. 1/1.24  Right kidney- 2% GS. Mild moderate IF and mild vascular chronicity   KDPI: 75%, cPRA:  0%, Historical cPRA 17%  Match: XM -ve, no DSA  CMV: D+/R+  Induction: thymoglobulin  Maintenance Immunosuppression: Triple drug therapy  Mild injury to renal artery with anastomosis, expect velocities to be high- started on ASA post op.   Post OP: tbd, CIT: 16 Hr     WIT:  35 Min  Studies: None    CC:   ESRD  S/P DDRT     Subjective:   Feels well today,  No n/v/d, no chest pain or sob     REVIEW OF SYSTEMS: Comprehensive 14-point ROS reviewed  Positives noted above otherwise negative.    Allergies:   No Known Allergies    Problem List:  Patient Active Problem List    Diagnosis Date Noted   ??? Class 1 obesity due to excess calories with serious comorbidity and body mass index (BMI) of 30.0 to 30.9 in adult 09/04/2018   ??? Acute postoperative pain of abdomen 09/04/2018   ??? Thrombocytopenia (HCC) 09/04/2018   ??? Kidney transplant candidate 09/01/2018   ??? End stage kidney disease (HCC) 08/28/2018   ??? ESRD (end stage renal disease) (HCC) 12/03/2015   ??? Abnormal stress test 12/03/2015   ??? Dialysis patient (HCC) 11/19/2015   ??? Pre-transplant evaluation for ESRD (end stage renal disease) 07/19/2014   ??? Chronic kidney disease 07/18/2014   ??? Hypertension 07/18/2014   ??? Dyslipidemia 07/18/2014   ??? Hyperphosphatemia 07/18/2014       Current Medications:    Current Facility-Administered Medications:   ???  acetaminophen (TYLENOL) tablet 650 mg, 650 mg, Oral, Q6H PRN, Archie Balboa, APRN-NP, 650 mg at 09/03/18 2213  ???  bisacodyL (DULCOLAX) rectal suppository 10 mg, 10 mg, Rectal, QDAY PRN, Archie Balboa, APRN-NP ???  butalbital/acetaminophen/caffeine (FIORICET) 50/325/40 mg (+) tablet 1-2 tablet, 1-2 tablet, Oral, Q4H PRN, Archie Balboa, APRN-NP, 1 tablet at 09/02/18 1246  ???  [COMPLETED] ceFAZolin (ANCEF) IVP 500 mg, 500 mg, Intravenous, Q12H*, 500 mg at 09/03/18 0829 **FOLLOWED BY** cephalexin (KEFLEX) capsule 500 mg, 500 mg, Oral, Q12H*, Liebscher, Lucienne Capers., MD, 500 mg at 09/04/18 0857  ???  epoetin alfa (PROCRIT) injection 4,000 Units, 4,000 Units, Subcutaneous, Once per day on Mon Wed Fri, Lapointe, Karli J, APRN-NP, 4,000 Units at 09/02/18 2017  ???  mycophenolate DR (MYFORTIC) tablet 720 mg, 720 mg, Oral, BID, Liebscher, Lucienne Capers., MD, 720 mg at 09/04/18 0857  ???  nystatin (MYCOSTATIN) oral suspension 500,000 Units, 500,000 Units, Swish & Swallow, QID, Liebscher, Lucienne Capers., MD, 500,000 Units at 09/04/18 1253  ???  ondansetron (ZOFRAN) injection 4-8 mg, 4-8 mg, Intravenous, Q6H PRN, Liebscher, Sean C., MD  ???  oxyCODONE (ROXICODONE) tablet 5-15 mg, 5-15 mg, Oral, Q4H PRN, Liebscher, Sean C., MD  ???  pantoprazole (PROTONIX) injection 40 mg, 40 mg, Intravenous, QDAY, Liebscher, Lucienne Capers., MD, 40 mg at 09/04/18 0855  ???  polyethylene glycol 3350 (MIRALAX) packet 17 g, 1 packet, Oral, QDAY, Archie Balboa, APRN-NP, 17 g at 09/03/18 0829  ???  [COMPLETED] methylPREDNISolone (SOLU-MEDROL PF) injection 120 mg,  120 mg, Intravenous, QDAY, 120 mg at 09/02/18 1512 **FOLLOWED BY** [COMPLETED] methylPREDNISolone (SOLU-MEDROL PF) injection 80 mg, 80 mg, Intravenous, QDAY, 80 mg at 09/03/18 0829 **FOLLOWED BY** [COMPLETED] methylPREDNISolone (SOLU-Medrol) injection 40 mg, 40 mg, Intravenous, QDAY, 40 mg at 09/04/18 1253 **FOLLOWED BY** [START ON 09/05/2018] predniSONE (DELTASONE) tablet 30 mg, 30 mg, Oral, QDAY **FOLLOWED BY** [START ON 09/06/2018] predniSONE (DELTASONE) tablet 20 mg, 20 mg, Oral, QDAY **FOLLOWED BY** [START ON 09/07/2018] predniSONE (DELTASONE) tablet 15 mg, 15 mg, Oral, QDAY **FOLLOWED BY** [START ON 09/08/2018] predniSONE (DELTASONE) tablet 10 mg, 10 mg, Oral, QDAY **FOLLOWED BY** [START ON 09/09/2018] predniSONE (DELTASONE) tablet 5 mg, 5 mg, Oral, QDAY(12), Lapointe, Karli J, APRN-NP  ???  senna/docusate (SENOKOT-S) tablet 2 tablet, 2 tablet, Oral, BID, Lapointe, Karli J, APRN-NP, 2 tablet at 09/03/18 0829  ???  tacrolimus (PROGRAF) capsule 4 mg, 4 mg, Oral, BID(6-18), Corena Herter, MD, 4 mg at 09/04/18 0615    Physical Exam:  BP (!) 150/81 (BP Source: Arm, Right Upper)  - Pulse 86  - Temp 36.6 ???C (97.9 ???F)  - Ht 167.6 cm (66)  - Wt 85.8 kg (189 lb 3.2 oz)  - SpO2 97%  - BMI 30.54 kg/m???     Intake/Output Summary (Last 24 hours) at 09/04/2018 1838  Last data filed at 09/04/2018 1315  Gross per 24 hour   Intake 400 ml   Output 2358 ml   Net -1958 ml     General: In NAD; A&Ox3  Skin: Warm, dry, no signs of rash   Neck: Supple   CV: Regular, regular rate, normal S1/S2, no murmurs  Lungs: CTA bilaterally in posterior fields  Abd: Grossly normal without rebound, guarding, masses, or bruits   Transplant: No overlying bruit, tender, incision dressing in place - clean, dry, and intact, + Vac drain, + JP drain   Ext: No edema  Neuro: No focal deficits   Psych: Affect appropriate  Dialysis Access: + AVF good thrill/bruit    Laboratory studies:     CMP:  CMP Latest Ref Rng & Units 09/04/2018 09/03/2018 09/02/2018 09/02/2018 09/01/2018   NA 137 - 147 MMOL/L 140 138 135(L) 137 138   K 3.5 - 5.1 MMOL/L 4.1 4.5 4.7 5.2(H) 4.3   CL 98 - 110 MMOL/L 103 100 100 102 104   CO2 21 - 30 MMOL/L 21 15(L) 16(L) 19(L) 18(L)   GAP 3 - 12 16(H) 23(H) 19(H) 16(H) 16(H)   BUN 7 - 25 MG/DL 16(X) 09(U) 04(V) 40(J) 36(H)   CR 0.4 - 1.00 MG/DL 8.11(B) 1.47(W) 2.95(A) 7.07(H) 7.04(H)   GLUX 70 - 100 MG/DL 213(Y) 865(H) 846(N) 97 90   CA 8.5 - 10.6 MG/DL 9.2 9.1 7.9(L) 7.3(L) 7.3(L)   TP 6.0 - 8.0 G/DL 6.2(X) 6.2 5.2(W) 5.2(L) -   ALB 3.5 - 5.0 G/DL 4.1(L) 3.8 2.4(M) 0.1(U) -   ALKP 25 - 110 U/L 71 72 55 40 -   ALT 7 - 56 U/L 3(L) 5(L) 5(L) 6(L) - TBILI 0.3 - 1.2 MG/DL 0.4 0.5 0.5 0.4 -   GFR >60 mL/min 8(L) 7(L) 6(L) 6(L) 6(L)   GFRAA >60 mL/min 9(L) 8(L) 7(L) 7(L) 7(L)     Hemoglobin A1C (%)   Date Value   09/01/2018 5.6   08/28/2018 5.6   10/28/2016 5.4     PTH Hormone (PG/ML)   Date Value   09/01/2018 90.2 (H)   08/28/2018 64.5     Lipase   Date Value Ref Range Status  11/21/2013 72 11 - 82 U/L Final     Amylase   Date Value Ref Range Status   11/21/2013 56 24 - 100 U/L Final     No results found for: BKDNAQT  No results found for: CMVDNAPCR  No results found for: IUMLBLD  No results found for: COPIES  No results found for: EBVDNAQT    TACROLIMUS LEVEL:  Tacrolimus (NG/ML)   Date Value   09/04/2018 19.5 (H)   09/03/2018 26.3 (HH)       CBC with Diff:  CBC with Diff Latest Ref Rng & Units 09/04/2018 09/03/2018   WBC 4.5 - 11.0 K/UL 3.1(L) 6.3   RBC 4.0 - 5.0 M/UL 2.13(L) 2.48(L)   HGB 12.0 - 15.0 GM/DL 7.3(L) 8.5(L)   HCT 36 - 45 % 20.8(L) 24.5(L)   MCV 80 - 100 FL 97.6 99.0   MCH 26 - 34 PG 34.4(H) 34.3(H)   MCHC 32.0 - 36.0 G/DL 08.6 57.8   RDW 11 - 15 % 18.6(H) 19.3(H)   PLT 150 - 400 K/UL 49(L) 62(L)   MPV 7 - 11 FL 9.4 8.7   NEUT 41 - 77 % 93(H) 95(H)   ANC 1.8 - 7.0 K/UL 2.90 5.95   LYMA 24 - 44 % 2(L) 2(L)   ALYM 1.0 - 4.8 K/UL 0.07(L) 0.14(L)   MONA 4 - 12 % 5 2(L)   AMONO 0 - 0.80 K/UL 0.16 0.13   EOSA 0 - 5 % 0 1   AEOS 0 - 0.45 K/UL 0.01 0.04   BASA 0 - 2 % 0 0   ABAS 0 - 0.20 K/UL 0.00 0.00     Lab Results   Component Value Date/Time    IRON 61 09/03/2018 05:42 AM    TIBC 252 (L) 09/03/2018 05:42 AM    PSAT 24 (L) 09/03/2018 05:42 AM    FERRITIN 3,526 (H) 09/03/2018 05:42 AM       Urinalysis:  Lab Results   Component Value Date/Time    UCOLOR STRAW 09/01/2018 07:01 AM    TURBID CLEAR 09/01/2018 07:01 AM    USPGR 1.016 09/01/2018 07:01 AM    UPH 8.0 09/01/2018 07:01 AM    UPROTEIN 2+ (A) 09/01/2018 07:01 AM    UAGLU 1+ (A) 09/01/2018 07:01 AM    UKET NEG 09/01/2018 07:01 AM    UBILE NEG 09/01/2018 07:01 AM    UBLD NEG 09/01/2018 07:01 AM UROB NORMAL 09/01/2018 07:01 AM     No results found for: [id number]    Imaging:  Results for orders placed during the hospital encounter of 10/29/15   CT ABD/PELV WO CONTRAST    Impression 1. Minimal aortoiliac calcific plaque.  2. Progression of bilateral renal atrophy and cortical scarring. No hydronephrosis.         Approved by Marcelline Mates, M.D. on 10/29/2015 2:00 PM    By my electronic signature, I attest that I have personally reviewed the images for this examination and formulated the interpretations and opinions expressed in this report       Finalized by Caro Hight, M.D. on 10/29/2015 4:53 PM. Dictated by Marcelline Mates, M.D. on 10/29/2015 12:59 PM.       Results for orders placed during the hospital encounter of 09/01/18   CHEST 2 VIEWS    Impression 1.  No  consolidation or pleural effusion  2.  Persistent nodule seen anteriorly on the lateral projection, which may represent a calcified nodule. Recommend nonemergent noncontrast CT chest to  evaluate for underlying noncalcified pulmonary or mediastinal nodule. This was previously communicated to the clinical team.    By my electronic signature, I attest that I have personally reviewed the images for this examination and formulated the interpretations and opinions expressed in this report       Finalized by Delmar Landau, M.D. on 09/01/2018 11:46 AM. Dictated by Gwinda Passe, MD on 09/01/2018 10:57 AM.         Patient Active Problem List    Diagnosis Date Noted   ??? Class 1 obesity due to excess calories with serious comorbidity and body mass index (BMI) of 30.0 to 30.9 in adult 09/04/2018   ??? Acute postoperative pain of abdomen 09/04/2018   ??? Thrombocytopenia (HCC) 09/04/2018   ??? Kidney transplant candidate 09/01/2018   ??? End stage kidney disease (HCC) 08/28/2018   ??? ESRD (end stage renal disease) (HCC) 12/03/2015   ??? Abnormal stress test 12/03/2015   ??? Dialysis patient (HCC) 11/19/2015 ??? Pre-transplant evaluation for ESRD (end stage renal disease) 07/19/2014   ??? Chronic kidney disease 07/18/2014   ??? Hypertension 07/18/2014   ??? Dyslipidemia 07/18/2014   ??? Hyperphosphatemia 07/18/2014       Cardiovascular Screening   Echocardiogram: Aug/2019 EF 60%  NM Stress Test: 2016 AV Block   Left Heart Catheterization: Date 2017, Findings: No significant angiographic coronary artery stenosis    Cancer Screening  Colonoscopy: 2017,Ulcer/Polyp, biopsy no malignancy   Mammogram: Date Jan/2020 - normal     PAP: Date 2016 - normal    Assessment and Plan:  Ms. Myklebust with history of ESRD 2/2 HTN s/p a renal transplant for which we are consulted. cPRA 0%, KDPI 75%    #) Immunosuppression - On maintenance Triple drug therapy  - Thymo 150/100/100. Will not adminiter anymore given thrombocytopenia   - Goal tacrolimus trough first 3 months after transplant is 12-17 ng/mL (MEIA) or 8-12 mcg/L (HPLC/LCMS).  -  Continue prograf 4mg  BID   - MPA 720mg  BID and steroid pulse to wean to prednisone 5 mg daily.    #) Deceased donor renal transplant: bl Cr tbd mg/dL, bl UPCR tbd gm/gm  - Renal graft function: excellent uop, along with modest improvement in scr    - No indication for HD   - Okay to give lasix today      #) ID Proph  - CMV Donor(+)/ Recipient(+) to treat with Valcyte for 3 months post transplant  - PJP Prophylax: To treat with Bactrim/Septra for 1 year post transplant  - Nystatin swish for 2 weeks post transplant    #) BPs: Fair control, monitor     #) Glycemia - BGs have been under Good control    #) Anemia - Hgb average of 8  - Iron stores well replete   - Continue EPO    #) Volume/Electrolytes:  - Hyperphosphatemia: will improve gradually     Corena Herter, MD  [facility]  Service Pager: [phone]

## 2018-09-04 NOTE — Care Plan
Problem: Infection, Risk of, Urinary Catheter-Associated Urinary Tract Infection  Goal: Absence of urinary catheter-associated infection  Outcome: Goal Ongoing     Problem: Infection, Risk of  Goal: Absence of infection  Outcome: Goal Ongoing  Goal: Knowledge of Infection Control Procedures  Outcome: Goal Ongoing     Problem: Falls, High Risk of  Goal: Absence of falls-Adult Patient  Outcome: Goal Ongoing     Problem: Discharge Planning  Goal: Participation in plan of care  Outcome: Goal Ongoing  Goal: Knowledge regarding plan of care  Outcome: Goal Ongoing  Goal: Prepared for discharge  Outcome: Goal Ongoing     Problem: Pain  Goal: Management of pain  Outcome: Goal Ongoing  Goal: Knowledge of pain management  Outcome: Goal Ongoing  Goal: Progress Toward Pain Management Goals  Outcome: Goal Ongoing     Problem: Skin Integrity  Goal: Healing of skin (Wound & Incision)  Outcome: Goal Ongoing     Problem: Nutrition Deficit  Goal: Adequate nutritional intake  Outcome: Goal Ongoing     Problem: Self-Care Deficit  Goal: Maximize ADL functioning  Outcome: Goal Ongoing     Problem: Mobility/Activity Intolerance  Goal: Maximize functional ADL's and mobility outcomes  Outcome: Goal Ongoing

## 2018-09-04 NOTE — Progress Notes
@  2130 Dr. Ollen Gross informed pt's sat 84% on RA, 02 1L/NC placed on pt with sat 97%. No new order at this time.

## 2018-09-05 ENCOUNTER — Encounter: Admit: 2018-09-01 | Discharge: 2018-09-01

## 2018-09-05 ENCOUNTER — Encounter: Admit: 2018-09-05 | Discharge: 2018-09-05

## 2018-09-05 ENCOUNTER — Encounter: Admit: 2018-09-02 | Discharge: 2018-09-02

## 2018-09-05 ENCOUNTER — Ambulatory Visit: Admit: 2018-09-01 | Discharge: 2018-09-05 | Disposition: A | Discharge status: Home / Self Care

## 2018-09-05 DIAGNOSIS — E6609 Other obesity due to excess calories: Secondary | ICD-10-CM

## 2018-09-05 DIAGNOSIS — I120 Hypertensive chronic kidney disease with stage 5 chronic kidney disease or end stage renal disease: Secondary | ICD-10-CM

## 2018-09-05 DIAGNOSIS — R51 Headache: Secondary | ICD-10-CM

## 2018-09-05 DIAGNOSIS — D62 Acute posthemorrhagic anemia: Secondary | ICD-10-CM

## 2018-09-05 DIAGNOSIS — Z7982 Long term (current) use of aspirin: Secondary | ICD-10-CM

## 2018-09-05 DIAGNOSIS — Z1159 Encounter for screening for other viral diseases: Secondary | ICD-10-CM

## 2018-09-05 DIAGNOSIS — D696 Thrombocytopenia, unspecified: Secondary | ICD-10-CM

## 2018-09-05 DIAGNOSIS — G8918 Other acute postprocedural pain: Secondary | ICD-10-CM

## 2018-09-05 DIAGNOSIS — E8770 Fluid overload, unspecified: Secondary | ICD-10-CM

## 2018-09-05 DIAGNOSIS — Z8719 Personal history of other diseases of the digestive system: Secondary | ICD-10-CM

## 2018-09-05 DIAGNOSIS — Z6830 Body mass index (BMI) 30.0-30.9, adult: Secondary | ICD-10-CM

## 2018-09-05 DIAGNOSIS — Z992 Dependence on renal dialysis: Secondary | ICD-10-CM

## 2018-09-05 DIAGNOSIS — E785 Hyperlipidemia, unspecified: Secondary | ICD-10-CM

## 2018-09-05 DIAGNOSIS — N186 End stage renal disease: Secondary | ICD-10-CM

## 2018-09-05 DIAGNOSIS — R9439 Abnormal result of other cardiovascular function study: Secondary | ICD-10-CM

## 2018-09-05 DIAGNOSIS — I10 Essential (primary) hypertension: Secondary | ICD-10-CM

## 2018-09-05 MED ORDER — VALGANCICLOVIR 450 MG PO TAB
450 mg | ORAL_TABLET | ORAL | 2 refills | Status: DC
Start: 2018-09-05 — End: 2018-12-02

## 2018-09-05 MED ORDER — PREDNISONE 5 MG PO TAB
ORAL_TABLET | Freq: Every day | 3 refills | Status: DC
Start: 2018-09-05 — End: 2018-09-22
  Filled 2018-09-05: qty 36, 30d supply, fill #1

## 2018-09-05 MED ORDER — DARBEPOETIN ALFA IN POLYSORBAT 40 MCG/0.4 ML IJ SYRG
40 ug | Freq: Once | SUBCUTANEOUS | 0 refills | Status: CN
Start: 2018-09-05 — End: ?

## 2018-09-05 MED ORDER — TACROLIMUS 1 MG PO CAP
ORAL_CAPSULE | Freq: Every day | ORAL | 11 refills | 30.00000 days | Status: DC
Start: 2018-09-05 — End: 2018-09-13

## 2018-09-05 MED ORDER — EPOETIN ALFA 4,000 UNIT/ML IJ SOLN
4000 [IU] | SUBCUTANEOUS | 0 refills | Status: CN
Start: 2018-09-05 — End: ?

## 2018-09-05 MED ORDER — OXYCODONE 5 MG PO TAB
5 mg | ORAL | 0 refills | Status: DC | PRN
Start: 2018-09-05 — End: 2018-09-05

## 2018-09-05 MED ORDER — POLYETHYLENE GLYCOL 3350 17 GRAM/DOSE PO POWD
17 g | Freq: Every day | ORAL | 11 refills | 30.00000 days | Status: DC | PRN
Start: 2018-09-05 — End: 2018-09-13

## 2018-09-05 MED ORDER — SENNOSIDES-DOCUSATE SODIUM 8.6-50 MG PO TAB
1-2 | ORAL_TABLET | Freq: Two times a day (BID) | ORAL | 11 refills | Status: DC | PRN
Start: 2018-09-05 — End: 2018-09-13

## 2018-09-05 MED ORDER — PREDNISONE 5 MG PO TAB
ORAL_TABLET | Freq: Every day | 3 refills | Status: DC
Start: 2018-09-05 — End: 2018-09-05

## 2018-09-05 MED ORDER — PANTOPRAZOLE 40 MG PO TBEC
40 mg | ORAL_TABLET | Freq: Every day | ORAL | 11 refills | 90.00000 days | Status: DC
Start: 2018-09-05 — End: 2018-10-05
  Filled 2018-09-05: qty 30, 30d supply, fill #1

## 2018-09-05 MED ORDER — CARVEDILOL 25 MG PO TAB
25 mg | ORAL_TABLET | Freq: Two times a day (BID) | ORAL | 6 refills | 90.00000 days | Status: DC
Start: 2018-09-05 — End: 2019-05-15
  Filled 2018-09-05: qty 60, 30d supply, fill #1

## 2018-09-05 MED ORDER — NYSTATIN 100,000 UNIT/ML PO SUSP
500000 [IU] | Freq: Four times a day (QID) | ORAL | 0 refills | 12.00000 days | Status: DC
Start: 2018-09-05 — End: 2018-10-18
  Filled 2018-09-05: qty 200, 10d supply, fill #1

## 2018-09-05 MED ORDER — PANTOPRAZOLE 40 MG PO TBEC
40 mg | Freq: Every day | ORAL | 0 refills | Status: DC
Start: 2018-09-05 — End: 2018-09-05
  Administered 2018-09-05: 13:00:00 40 mg via ORAL

## 2018-09-05 MED ORDER — CARVEDILOL 12.5 MG PO TAB
12.5 mg | Freq: Two times a day (BID) | ORAL | 0 refills | Status: DC
Start: 2018-09-05 — End: 2018-09-05
  Administered 2018-09-05: 13:00:00 12.5 mg via ORAL

## 2018-09-05 MED ORDER — SULFAMETHOXAZOLE-TRIMETHOPRIM 400-80 MG PO TAB
1 | ORAL_TABLET | Freq: Every day | ORAL | 11 refills | Status: AC
Start: 2018-09-05 — End: ?
  Filled 2018-09-05: qty 30, 30d supply, fill #1

## 2018-09-05 MED ORDER — ASPIRIN 81 MG PO TBEC
ORAL | 0 refills | 30.00000 days | Status: DC
Start: 2018-09-05 — End: 2018-10-24

## 2018-09-05 NOTE — Progress Notes
CLINICAL NUTRITION                                                        Clinical Nutrition Follow-Up Assessment   Discharge Planning Phase    Name: Michele Benitez        MRN: [id number]          DOB: 07-Feb-1961          Age: 58 y.o.  Admission Date: 09/01/2018             LOS: 4 days        Recommendation:  ??? Agree with Regular/non-therapeutic diet.  ??? Encourage PO efforts.    Comments:  58 yo female with Hx of HTN on HD since 08/2014. Patient is now s/p DDRT 6/11. Met with patient and spouse at bedside today.  Diet advance to CLD 6/12, advanced to Regular 6/13.  She reports following a Renal diet at home PTA with a UBW of ~179 pounds. Current wt from bed is 183 pounds (down 10lb from admit, but closer to UBW). Reviewed nutrition after transplant. Cr 4.7. 24hr UO -2,092ml. LBM 6/14, denied GI complaints now that diarrhea is resolving.   Reviewed following general healthful diet as spouse repeatedly stated that they will mostly eat out.                              Pt has been educated on post transplant nutrition guidelines including food safety, hand hygiene, grapefruit and pomegranate avoidance. Pt and/or family were given the opportunity to ask questions. Diet has been advanced to Regular and pt is tolerating diet without GI distress. Pt is eating inadequately, with slowly improving intake. Pt is planning to discharge to local lodging ([facility]) and spouse will be preparing foods. Pt is ready for discharge from a nutritional perspective.     Nutrition Assessment of Patient:  Admit Weight: 88.8 kg; Weight Change Since Admit: -5.7 kg  BMI (Calculated): 29.62; BMI Categories Adult: Obesity Class I: 30-34.9  Pertinent Allergies/Intolerances: NA  Pertinent Labs: Cr 4.7, glucose 148, ALT 5, LDH 257; Pertinent Meds: Coreg, keflex, myfortic, mycostatin, miralax, senna, prograf.;    Oral Diet Order: Regular;       Current Oral Intake: Marginally Adequate;Improving Estimated Calorie Needs: 1960(28kcal/kg/desired wt)  Estimated Protein Needs: 91(1.3g/kg/desired wt)    Malnutrition Assessment:  Adequately nourished prior to admission;      Nutrition Focused Physical Assessment:  Loss of Subcutaneous Fat: No;      Muscle Wasting: No;      Edema: No;         Pressure Injury: none noted     Comment: LBM 6/14    Nutrition Diagnosis:  Increased nutrient needs, specify:(kcal/protein)  Etiology: demands for healing  Signs & Symptoms: s/p DDRT                      Intervention / Plan:  Monitor po intake, wt, lab trends and Gi symptoms  Provide post transplant diet education.       Goals:  Verbalize understanding of diet  Time Frame: Prior to discharge  Status: Met;Ongoing  Avoid prolonged clear liquid status  Time Frame: Within 72 hours  Status: Met;No longer appropriate  Patient to consume >  75% of meals   Time Frame: Throughout stay    Margarita Grizzle, MS, RD, LD   Phone [phone] [place] [phone] - Pager [phone]

## 2018-09-05 NOTE — Progress Notes
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  Donor: DCD 6th decade of life, Ht 160 cm. Wt 85 kg. COD: Cardiovascular/Anoxia, Creatinine Initial/Terminal. 1/1.24  Right kidney- 2% GS. Mild moderate IF and mild vascular chronicity   KDPI: 75%, cPRA:  0%, Historical cPRA 7%  Match: XM -ve, no DSA  CMV: D+/R+  Induction: thymoglobulin 3.8 mg/kg   Maintenance Immunosuppression: Triple drug therapy  Mild injury to renal artery with anastomosis, expect velocities to be high- started on ASA post op.   Post OP: tbd, CIT: 16 Hr     WIT:  35 Min  Studies: None    CC:   ESRD  S/P DDRT     Subjective:   Feels well today,  No n/v/d, no chest pain or sob     REVIEW OF SYSTEMS: Comprehensive 14-point ROS reviewed  Positives noted above otherwise negative.    Allergies:   No Known Allergies    Problem List:  Patient Active Problem List    Diagnosis Date Noted   ??? Class 1 obesity due to excess calories with serious comorbidity and body mass index (BMI) of 30.0 to 30.9 in adult 09/04/2018   ??? Acute postoperative pain of abdomen 09/04/2018   ??? Thrombocytopenia (HCC) 09/04/2018   ??? Kidney transplant candidate 09/01/2018   ??? End stage kidney disease (HCC) 08/28/2018   ??? ESRD (end stage renal disease) (HCC) 12/03/2015   ??? Abnormal stress test 12/03/2015   ??? Dialysis patient (HCC) 11/19/2015   ??? Pre-transplant evaluation for ESRD (end stage renal disease) 07/19/2014   ??? Chronic kidney disease 07/18/2014   ??? Hypertension 07/18/2014   ??? Dyslipidemia 07/18/2014   ??? Hyperphosphatemia 07/18/2014       Current Medications:  No current facility-administered medications for this visit.     Current Outpatient Medications:   ???  acetaminophen (TYLENOL) 500 mg tablet, Take 500 mg by mouth every 6 hours as needed for Pain. Max of 4,000 mg of acetaminophen in 24 hours., Disp: , Rfl: ???  aspirin EC 81 mg tablet, Do not take until directed by transplant clinic, Disp: , Rfl:   ???  carvediloL (COREG) 25 mg tablet, Take one tablet by mouth twice daily., Disp: 60 tablet, Rfl: 6  ???  mycophenolate DR (MYFORTIC) 180 mg TbEC tablet, Take four tablets by mouth twice daily., Disp: 240 tablet, Rfl: 11  ???  nystatin (MYCOSTATIN) 100,000 units/mL oral suspension, Swish and Swallow 5 mL by mouth as directed four times daily., Disp: 200 mL, Rfl: 0  ???  [START ON 09/06/2018] pantoprazole DR (PROTONIX) 40 mg tablet, Take one tablet by mouth daily., Disp: 30 tablet, Rfl: 11  ???  polyethylene glycol 3350 (MIRALAX) 17 gram/dose powder, Take seventeen g by mouth daily as needed for Constipation., Disp: 510 g, Rfl: 11  ???  predniSONE (DELTASONE) 5 mg tablet, 6/16: take 4 tabs (20 mg) by mouth daily, 6/17: take 3 tabs (15 mg) daily, 6/18: take 2 tabs (10mg ) daily, 6/19 and thereafter: take 1 tab (5mg ) daily, Disp: 36 tablet, Rfl: 3  ???  senna/docusate (SENOKOT-S) 8.6/50 mg tablet, Take one tablet to two tablets by mouth twice daily as needed., Disp: 120 tablet, Rfl: 11  ???  simvastatin (ZOCOR) 20 mg tablet, Take 20 mg by mouth at bedtime daily., Disp: ,  Rfl:   ???  tacrolimus (PROGRAF) 1 mg capsule, Take four capsules by mouth daily with breakfast AND three capsules at bedtime daily., Disp: 480 capsule, Rfl: 11  ???  trimethoprim/sulfamethoxazole (BACTRIM) 80/400 mg tablet, Take one tablet by mouth daily., Disp: 30 tablet, Rfl: 11  ???  valGANciclovir (VALCYTE) 450 mg tablet, Take one tablet by mouth twice weekly. On Monday and Thursday, Disp: 60 tablet, Rfl: 2    Facility-Administered Medications Ordered in Other Visits:   ???  acetaminophen (TYLENOL) tablet 650 mg, 650 mg, Oral, Q6H PRN, Archie Balboa, APRN-NP, 650 mg at 09/04/18 2045  ???  bisacodyL (DULCOLAX) rectal suppository 10 mg, 10 mg, Rectal, QDAY PRN, Archie Balboa, APRN-NP  ???  butalbital/acetaminophen/caffeine (FIORICET) 50/325/40 mg (+) tablet 1-2 tablet, 1-2 tablet, Oral, Q4H PRN, Archie Balboa, APRN-NP, 1 tablet at 09/02/18 1246  ???  carvediloL (COREG) tablet 12.5 mg, 12.5 mg, Oral, BID, Archie Balboa, APRN-NP, 12.5 mg at 09/05/18 9811  ???  [COMPLETED] ceFAZolin (ANCEF) IVP 500 mg, 500 mg, Intravenous, Q12H*, 500 mg at 09/03/18 0829 **FOLLOWED BY** cephalexin (KEFLEX) capsule 500 mg, 500 mg, Oral, Q12H*, Liebscher, Lucienne Capers., MD, 500 mg at 09/05/18 9147  ???  epoetin alfa (PROCRIT) injection 4,000 Units, 4,000 Units, Subcutaneous, Once per day on Mon Wed Fri, Lapointe, Karli J, APRN-NP, 4,000 Units at 09/02/18 2017  ???  mycophenolate DR (MYFORTIC) tablet 720 mg, 720 mg, Oral, BID, Liebscher, Lucienne Capers., MD, 720 mg at 09/05/18 8295  ???  nystatin (MYCOSTATIN) oral suspension 500,000 Units, 500,000 Units, Swish & Swallow, QID, Liebscher, Lucienne Capers., MD, 500,000 Units at 09/05/18 1253  ???  ondansetron (ZOFRAN) injection 4-8 mg, 4-8 mg, Intravenous, Q6H PRN, Liebscher, Sean C., MD  ???  oxyCODONE (ROXICODONE) tablet 5 mg, 5 mg, Oral, Q4H PRN, Archie Balboa, APRN-NP  ???  pantoprazole DR (PROTONIX) tablet 40 mg, 40 mg, Oral, QDAY, Archie Balboa, APRN-NP, 40 mg at 09/05/18 6213  ???  polyethylene glycol 3350 (MIRALAX) packet 17 g, 1 packet, Oral, QDAY, Archie Balboa, APRN-NP, 17 g at 09/03/18 0829  ???  [COMPLETED] methylPREDNISolone (SOLU-MEDROL PF) injection 120 mg, 120 mg, Intravenous, QDAY, 120 mg at 09/02/18 1512 **FOLLOWED BY** [COMPLETED] methylPREDNISolone (SOLU-MEDROL PF) injection 80 mg, 80 mg, Intravenous, QDAY, 80 mg at 09/03/18 0829 **FOLLOWED BY** [COMPLETED] methylPREDNISolone (SOLU-Medrol) injection 40 mg, 40 mg, Intravenous, QDAY, 40 mg at 09/04/18 1253 **FOLLOWED BY** [COMPLETED] predniSONE (DELTASONE) tablet 30 mg, 30 mg, Oral, QDAY, 30 mg at 09/05/18 0827 **FOLLOWED BY** [START ON 09/06/2018] predniSONE (DELTASONE) tablet 20 mg, 20 mg, Oral, QDAY **FOLLOWED BY** [START ON 09/07/2018] predniSONE (DELTASONE) tablet 15 mg, 15 mg, Oral, QDAY **FOLLOWED BY** [START ON 09/08/2018] predniSONE (DELTASONE) tablet 10 mg, 10 mg, Oral, QDAY **FOLLOWED BY** [START ON 09/09/2018] predniSONE (DELTASONE) tablet 5 mg, 5 mg, Oral, QDAY(12), Lapointe, Karli J, APRN-NP  ???  senna/docusate (SENOKOT-S) tablet 2 tablet, 2 tablet, Oral, BID, Lapointe, Karli J, APRN-NP, 2 tablet at 09/05/18 0827  ???  tacrolimus (PROGRAF) capsule 4 mg, 4 mg, Oral, BID(6-18), Corena Herter, MD, 4 mg at 09/05/18 0865    Physical Exam:  BP (!) 173/96 (BP Source: Arm, Right Upper)  - Pulse 79  - Temp 36.8 ???C (98.3 ???F)  - Ht 167.6 cm (65.98)  - Wt 83.2 kg (183 lb 6.4 oz)  - SpO2 100%  - BMI 29.62 kg/m???     Intake/Output Summary (Last 24 hours) at 09/05/2018 1407  Last data filed at 09/05/2018 1154  Gross per 24 hour   Intake 600 ml   Output 2281.5 ml   Net -1681.5 ml     General: In NAD; A&Ox3  Skin: Warm, dry, no signs of rash   Neck: Supple   CV: Regular, regular rate, normal S1/S2, no murmurs  Lungs: CTA bilaterally in posterior fields  : Grossly normal without rebound, guarding, masses, or bruits   Transplant: No overlying bruit, tender, incision dressing in place - clean, dry, and intact, + Vac drain, + JP drain   Ext: No edema  Neuro: No focal deficits   Psych: Affect appropriate  Dialysis Access: + AVF good thrill/bruit    Laboratory studies:     CMP:  CMP Latest Ref Rng & Units 09/05/2018 09/04/2018 09/03/2018 09/02/2018 09/02/2018   NA 137 - 147 MMOL/L 141 140 138 135(L) 137   K 3.5 - 5.1 MMOL/L 3.6 4.1 4.5 4.7 5.2(H)   CL 98 - 110 MMOL/L 104 103 100 100 102   CO2 21 - 30 MMOL/L 22 21 15(L) 16(L) 19(L)   GAP 3 - 12 15(H) 16(H) 23(H) 19(H) 16(H)   BUN 7 - 25 MG/DL 16(X) 09(U) 04(V) 40(J) 41(H)   CR 0.4 - 1.00 MG/DL 8.11(B) 1.47(W) 2.95(A) 7.07(H) 7.07(H)   GLUX 70 - 100 MG/DL 213(Y) 865(H) 846(N) 629(B) 97   CA 8.5 - 10.6 MG/DL 9.2 9.2 9.1 7.9(L) 7.3(L)   TP 6.0 - 8.0 G/DL 6.0 2.8(U) 6.2 1.3(K) 5.2(L) ALB 3.5 - 5.0 G/DL 3.5 3.4(L) 3.8 3.4(L) 3.4(L)   ALKP 25 - 110 U/L 83 71 72 55 40   ALT 7 - 56 U/L 5(L) 3(L) 5(L) 5(L) 6(L)   TBILI 0.3 - 1.2 MG/DL 0.5 0.4 0.5 0.5 0.4   GFR >60 mL/min 10(L) 8(L) 7(L) 6(L) 6(L)   GFRAA >60 mL/min 12(L) 9(L) 8(L) 7(L) 7(L)     Hemoglobin A1C (%)   Date Value   09/01/2018 5.6   08/28/2018 5.6   10/28/2016 5.4     PTH Hormone (PG/ML)   Date Value   09/01/2018 90.2 (H)   08/28/2018 64.5     Lipase   Date Value Ref Range Status   11/21/2013 72 11 - 82 U/L Final     Amylase   Date Value Ref Range Status   11/21/2013 56 24 - 100 U/L Final     No results found for: BKDNAQT  No results found for: CMVDNAPCR  No results found for: IUMLBLD  No results found for: COPIES  No results found for: EBVDNAQT    TACROLIMUS LEVEL:  Tacrolimus (NG/ML)   Date Value   09/05/2018 20.4 (H)   09/04/2018 19.5 (H)   09/03/2018 26.3 (HH)       CBC with Diff:  CBC with Diff Latest Ref Rng & Units 09/05/2018 09/04/2018   WBC 4.5 - 11.0 K/UL 5.6 3.1(L)   RBC 4.0 - 5.0 M/UL 2.25(L) 2.13(L)   HGB 12.0 - 15.0 GM/DL 7.6(L) 7.3(L)   HCT 36 - 45 % 22.0(L) 20.8(L)   MCV 80 - 100 FL 97.8 97.6   MCH 26 - 34 PG 33.9 34.4(H)   MCHC 32.0 - 36.0 G/DL 44.0 10.2   RDW 11 - 15 % 18.2(H) 18.6(H)   PLT 150 - 400 K/UL 55(L) 49(L)   MPV 7 - 11 FL 8.8 9.4   NEUT 41 - 77 % 95(H) 93(H)   ANC 1.8 - 7.0 K/UL 5.31 2.90   LYMA 24 - 44 % 2(L) 2(L)  ALYM 1.0 - 4.8 K/UL 0.11(L) 0.07(L)   MONA 4 - 12 % 3(L) 5   AMONO 0 - 0.80 K/UL 0.19 0.16   EOSA 0 - 5 % 0 0   AEOS 0 - 0.45 K/UL 0.00 0.01   BASA 0 - 2 % 0 0   ABAS 0 - 0.20 K/UL 0.00 0.00     Lab Results   Component Value Date/Time    IRON 61 09/03/2018 05:42 AM    TIBC 252 (L) 09/03/2018 05:42 AM    PSAT 24 (L) 09/03/2018 05:42 AM    FERRITIN 3,526 (H) 09/03/2018 05:42 AM       Urinalysis:  Lab Results   Component Value Date/Time    UCOLOR STRAW 09/01/2018 07:01 AM    TURBID CLEAR 09/01/2018 07:01 AM    USPGR 1.016 09/01/2018 07:01 AM    UPH 8.0 09/01/2018 07:01 AM UPROTEIN 2+ (A) 09/01/2018 07:01 AM    UAGLU 1+ (A) 09/01/2018 07:01 AM    UKET NEG 09/01/2018 07:01 AM    UBILE NEG 09/01/2018 07:01 AM    UBLD NEG 09/01/2018 07:01 AM    UROB NORMAL 09/01/2018 07:01 AM     No results found for: [id number]    Imaging:  Results for orders placed during the hospital encounter of 10/29/15   CT /PELV WO CONTRAST    Impression 1. Minimal aortoiliac calcific plaque.  2. Progression of bilateral renal atrophy and cortical scarring. No hydronephrosis.         Approved by Marcelline Mates, M.D. on 10/29/2015 2:00 PM    By my electronic signature, I attest that I have personally reviewed the images for this examination and formulated the interpretations and opinions expressed in this report       Finalized by Caro Hight, M.D. on 10/29/2015 4:53 PM. Dictated by Marcelline Mates, M.D. on 10/29/2015 12:59 PM.       Results for orders placed during the hospital encounter of 09/01/18   CHEST 2 VIEWS    Impression 1.  No  consolidation or pleural effusion  2.  Persistent nodule seen anteriorly on the lateral projection, which may represent a calcified nodule. Recommend nonemergent noncontrast CT chest to evaluate for underlying noncalcified pulmonary or mediastinal nodule. This was previously communicated to the clinical team.    By my electronic signature, I attest that I have personally reviewed the images for this examination and formulated the interpretations and opinions expressed in this report       Finalized by Delmar Landau, M.D. on 09/01/2018 11:46 AM. Dictated by Gwinda Passe, MD on 09/01/2018 10:57 AM.         Patient Active Problem List    Diagnosis Date Noted   ??? Class 1 obesity due to excess calories with serious comorbidity and body mass index (BMI) of 30.0 to 30.9 in adult 09/04/2018   ??? Acute postoperative pain of abdomen 09/04/2018   ??? Thrombocytopenia (HCC) 09/04/2018   ??? Kidney transplant candidate 09/01/2018   ??? End stage kidney disease (HCC) 08/28/2018 ??? ESRD (end stage renal disease) (HCC) 12/03/2015   ??? Abnormal stress test 12/03/2015   ??? Dialysis patient (HCC) 11/19/2015   ??? Pre-transplant evaluation for ESRD (end stage renal disease) 07/19/2014   ??? Chronic kidney disease 07/18/2014   ??? Hypertension 07/18/2014   ??? Dyslipidemia 07/18/2014   ??? Hyperphosphatemia 07/18/2014       Cardiovascular Screening   Echocardiogram: Aug/2019 EF 60%  NM Stress Test: 2016 AV Block  Left Heart Catheterization: Date 2017, Findings: No significant angiographic coronary artery stenosis    Cancer Screening  Colonoscopy: 2017,Ulcer/Polyp, biopsy no malignancy   Mammogram: Date Jan/2020 - normal     PAP: Date 2016 - normal    Assessment and Plan:  Ms. Chiara with history of ESRD 2/2 HTN s/p a renal transplant for which we are consulted. cPRA 0%, KDPI 75%    #) Immunosuppression - On maintenance Triple drug therapy  - Thymo 150/100/100. Total of 3.8 mg/kg, given thrombocytopenia   - Goal tacrolimus trough first 3 months after transplant is 12-17 ng/mL (MEIA) or 8-12 mcg/L (HPLC/LCMS).  - Will reduce prograf  Dose today to 4/3mg , given elevated trough    - MPA 720mg  BID and steroid pulse to wean to prednisone 5 mg daily.    #) Deceased donor renal transplant: bl Cr tbd mg/dL, bl UPCR tbd gm/gm  - Renal graft function: excellent uop, with gradual improvement in scr    - could be dc home today   - Will hold aspirin until resolution of thrombocytopenia      #) ID Proph  - CMV Donor(+)/ Recipient(+) to treat with Valcyte for 3 months post transplant  - PJP Prophylax: To treat with Bactrim/Septra for 1 year post transplant  - Nystatin swish for 2 weeks post transplant    #) BPs: target ~ 150, given difficult renal artery anastomosis    #) Glycemia - BGs have been under Good control    #) Anemia - Hgb today is 7.6  - Iron stores well replete   - RX EPO    #) Volume/Electrolytes: acceptable      Assalam, MD  [facility] Transplant  Service Pager: [phone]

## 2018-09-05 NOTE — Case Management (ED)
In [place] provider list sent to Marlowe Shores per request.    Virgel Bouquet, CMA

## 2018-09-05 NOTE — Discharge Instructions - Pharmacy
Physician Discharge Summary      Name: Michele Benitez  Medical Record Number: [id number]        Account Number:  [id number]  Date Of Birth:  1960/12/20                         Age:  57 years   Admit date:  12-Sep-2018                     Discharge date:  09/05/2018    Attending Physician:  Dr Nicholos Johns               Service: Surgery-Transplant    Physician Summary completed by: Gretta Arab, APRN     Reason for hospitalization: end stage renal disease    Significant PMH:   Medical History:   Diagnosis Date   ??? Abnormal stress test 12/03/2015   ??? Dyslipidemia 07/18/2014   ??? ESRD (end stage renal disease) (HCC) 12/03/2015   ??? Hyperlipemia    ??? Hyperphosphatemia 07/18/2014   ??? Hypertension 07/18/2014       Allergies: Patient has no known allergies.    Admission Physical Exam notable for:    Constitutional: She appears well-developed and well-nourished.   HENT:   Head: Normocephalic and atraumatic.   Cardiovascular: Normal rate and regular rhythm.   Pulmonary/Chest: Effort normal. No respiratory distress.   Abdominal: Soft.   Appropriately ttp; dressing in place without saturation   Skin: Skin is warm and dry.     Admission Lab/Radiology studies notable for: Cr 4.70, tacrolimus 20.4    Brief Hospital Course:  The patient was admitted and taken to OR 09/12/2018 for deceased donor renal transplant. Induced with Thymo. Tolerated procedure without noted complications. Taken to floor where she progressed as expected. Renal function improved daily. Pain control achieved with oral regimen. Diet initiated and tolerated without nausea. Patient ambulating and voiding without difficulty. Immunosuppression adjusted as appropriate. Thymo course completed. Determined stable for discharge with twice weekly labs and follow-up with Dr Curt Bears on 09/06/18.      Condition at Discharge: Stable    Discharge Diagnoses:    Hospital Problems        Active Problems    * (Principal) Kidney transplant candidate    Hypertension    Dyslipidemia Class 1 obesity due to excess calories with serious comorbidity and body mass index (BMI) of 30.0 to 30.9 in adult    Acute postoperative pain of abdomen    Thrombocytopenia (HCC)    Acute blood loss anemia    Hypomagnesemia    Hyperphosphatemia    Hypocalcemia    Kidney transplanted    Immunosuppression [facility])          Surgical Procedures: 2018/09/12 deceased donor renal transplant    Significant Diagnostic Studies and Procedures: noted in brief hospital course    Consults:  Nephrology    Patient Disposition: Home       Patient instructions/medications:       Regular Diet    You have no dietary restriction. Please continue with a healthy balanced diet.     Discharge Signs/Symptoms    Standard Please contact your doctor if you have any of the following symptoms: temperature higher than 100.4 degrees F, uncontrolled pain, persistent nausea and/or vomiting, difficulty breathing, chest pain, severe abdominal pain, unable to urinate, unable to have bowel movement or drainage with a foul odor     Questions About Your Stay  Standard  For questions or concerns regarding your hospital stay:    -DURING BUSINESS HOUR (8:00 AM - 4:30 PM):  Call [phone] and ask to be transferred to your discharge attending physician.    -AFTER BUSINESS HOURS (4:30 PM - 8:00 AM, on weekends, or holidays):  Call [phone] and ask the operator to page the on-call doctor for the discharge attending physician.     Discharging attending physician: Nicholos Johns [[id number]]      Bathing Restrictions    Do not submerge in baths/pools/hot tubs. Showers are okay.     Lifting Restrictions    Do not lift more than 10 pounds for 6 week(s).     Strenuous Activity Restrictions    Please refrain from strenuous activity     Suture/Staple Removal    You will need your staples removed at follow up appointment.      Current Discharge Medication List       START taking these medications    Details mycophenolate DR (MYFORTIC) 180 mg TbEC tablet Take four tablets by mouth twice daily.  Qty: 240 tablet, Refills: 11    PRESCRIPTION TYPE:  Normal      nystatin (MYCOSTATIN) 100,000 units/mL oral suspension Swish and Swallow 5 mL by mouth as directed four times daily.  Qty: 200 mL, Refills: 0    PRESCRIPTION TYPE:  Normal      pantoprazole DR (PROTONIX) 40 mg tablet Take one tablet by mouth daily.  Qty: 30 tablet, Refills: 11    PRESCRIPTION TYPE:  Normal      polyethylene glycol 3350 (MIRALAX) 17 gram/dose powder Take seventeen g by mouth daily as needed for Constipation.  Qty: 510 g, Refills: 11    PRESCRIPTION TYPE:  Normal      predniSONE (DELTASONE) 5 mg tablet 6/16: take 4 tabs (20 mg) by mouth daily, 6/17: take 3 tabs (15 mg) daily, 6/18: take 2 tabs (10mg ) daily, 6/19 and thereafter: take 1 tab (5mg ) daily  Qty: 36 tablet, Refills: 3    PRESCRIPTION TYPE:  Normal      senna/docusate (SENOKOT-S) 8.6/50 mg tablet Take one tablet to two tablets by mouth twice daily as needed.  Qty: 120 tablet, Refills: 11    PRESCRIPTION TYPE:  Normal      tacrolimus (PROGRAF) 1 mg capsule Take four capsules by mouth daily with breakfast AND three capsules at bedtime daily.  Qty: 480 capsule, Refills: 11    PRESCRIPTION TYPE:  Normal  Comments: Dose adjustment. Please place on file      trimethoprim/sulfamethoxazole (BACTRIM) 80/400 mg tablet Take one tablet by mouth daily.  Qty: 30 tablet, Refills: 11    PRESCRIPTION TYPE:  Normal      valGANciclovir (VALCYTE) 450 mg tablet Take one tablet by mouth twice weekly. On Monday and Thursday  Qty: 60 tablet, Refills: 2    PRESCRIPTION TYPE:  Normal  Comments: Dose adjustment. Please place on file          CONTINUE these medications which have been CHANGED or REFILLED    Details   aspirin EC 81 mg tablet Do not take until directed by transplant clinic    PRESCRIPTION TYPE:  No Print      carvediloL (COREG) 25 mg tablet Take one tablet by mouth twice daily. Qty: 60 tablet, Refills: 6    PRESCRIPTION TYPE:  Normal          CONTINUE these medications which have NOT CHANGED    Details   acetaminophen (  TYLENOL) 500 mg tablet Take 500 mg by mouth every 6 hours as needed for Pain. Max of 4,000 mg of acetaminophen in 24 hours.    PRESCRIPTION TYPE:  Historical Med      simvastatin (ZOCOR) 20 mg tablet Take 20 mg by mouth at bedtime daily.    PRESCRIPTION TYPE:  Historical Med          The following medications were removed from your list. This list includes medications discontinued this stay and those removed from your prior med list in our system        famotidine (PEPCID) 40 mg tablet        multivit-mins 25/folic acid/D3 (DIALYVITE SUPREME D PO)        NIFEdipine SR (PROCARDIA-XL; ADALAT CC) 30 mg tablet        sodium bicarbonate 325 mg tablet            Scheduled appointments:    Sep 06, 2018  8:40 AM CDT  Procedure with CFT DRAW CHAIR 3  [facility] (--) No address on file   Sep 06, 2018 10:40 AM CDT  Hospital Follow Up with Rosaland Lao, MD  [facility] ([facility] [place]) [address]  [address]  [place] [address]  [phone]          Pending items needing follow up: ureteral stent removal    Signed:  Gretta Arab, APRN  09/05/2018      cc:  Primary Care Physician:  Steva Ready   Referring physicians:  Steva Ready, MD   Additional provider(s):

## 2018-09-05 NOTE — Telephone Encounter
Please schedule patient for transplant ureteral stent removal 10/13/18 or any date thereafter. Thank you!Marland Kitchen

## 2018-09-05 NOTE — Progress Notes
On 09/05/18 Michele Benitez was provided with a list of their discharge medications through a [facility]. The patient was instructed to bring this medication list to their next doctor's appointment and to update the list with any medication changes. The patient was sent a link electronically so that they can access their medication list through the MyMedSchedule App and/or through e-mail.     Pillbox teaching was completed and where indicated, the patient was provided with additional medication and/or disease-state information. The patients medications were all filled at the [facility] outpatient pharmacy and delivered to the patients room prior to discharge. The patient's husband filled up their pillbox with their medications. Patient demonstrated understanding of their medications and displayed the ability to set up their pillbox.     All patient and husband's questions were answered and patient acknowledged understanding of the medications, side effects, and other pertinent medication information.    I attended multidisciplinary rounds and agree with the plan to discharge Michele Benitez. Michele Benitez has been educated on their medications and is prepared to manage their medications at home. The patient and caregivers have been provided with pharmacy contact information should questions arise.     Henrietta Hoover, [facility]  09/05/18

## 2018-09-05 NOTE — Discharge Planning (AHS/AVS)
[facility] will be providing care within 24-48 hours of discharge. Please contact them at [phone] with any questions and/or concerns you may have.    Thank you and it was a pleasure taking care of you!    Marlowe Shores, RN, BSN  Nurse Case Manager  Hematology / Surgical Non-Transplant  Phone: [phone]  Pager:  [phone]  Fax:  [phone]

## 2018-09-05 NOTE — Case Management (ED)
Case Management Progress Note  Discharge Planning Phase  NAME:Michele Benitez                          MRN: [id number]              DOB:May 09, 1960          AGE: 59 y.o.  ADMISSION DATE: 09/01/2018             DAYS ADMITTED: LOS: 4 days      Today???s Date: 09/05/2018    Plan Pt will discharge today to local lodging ([facility]). Husband; [name] to provide support and transportation at discharge.     Interventions  ? Support   Support: Pt/Family Updates re:POC or DC Plan     SW attempted to provide pt with writing your donor family pamphlet should pt ever be interested in writing to her donor family. SW explained the letter writing process and explained how letters are sent by the recipients and then received by the donors.   Pt stated that is not something she would be interested in, also stated she would prefer to not have copy of pamphlet. SW verbalized understanding.     SW provided pt with information on the Kidney Transplant Support Group that is held at [facility] and [facility].     SW met with pt and [name] at bed to discuss discharge plans. Pt was about to get wound vac removed, and get ready for discharge. Pt stated  I feel great today. I am ready to leave. SW inquired about how pt has been coping, pt stated:  I have been great, not having any pain at all. Ready to start my new life.     No questions or concerns related to discharge plans identified at this time.     ? Info or Referral   Information or Referral to Community Resources: No Needs Identified  ? Discharge Planning   Discharge Planning: Home Health   SW participated in huddle via conference call. Stated pt would be medically stable to dc today.   ? Medication Needs   Medication Needs: No Needs Identified            ? Financial   Financial: No Needs Identified  ? Legal      ? Other        Disposition  ? Expected Discharge Date    Expected Discharge Date: 09/05/18  Expected Discharge Time: 1400  ? Transportation Does the patient need discharge transport arranged?: No  Transportation Name, Phone and Availability #1: Husband [name] to provide transport   ? Next Level of Care (Acute Psych discharges only)      ? Discharge Disposition                Durable Medical Equipment      No service has been selected for the patient.      [facility] [place] [facility]      No service has been selected for the patient.      [facility] - Selection Complete      Service Provider Request Status Selected Services Address Phone Number Fax Number    [facility] Selected Home Health Services [address] [place], [place] [address] [phone]      [facility]      No service has been selected for the patient.   [place], LMSW  Pager:[phone]

## 2018-09-05 NOTE — Progress Notes
Pharmacy Transplant Education    The new immunosuppressive medication regimen for Michele Benitez was evaluated, and the patient and her husband were educated on these medication(s).     Regimen   The medication(s) listed below have been appropriately prescribed for immunosuppression following Kidney transplantation.     ??? Program (tacrolimus) 4 mg in the morning and 3 mg in the evening  ??? Myfortic (mycophenolate sodium) 720 mg twice daily   ??? Prednisone 20 mg daily with a rapid taper down to 5 mg daily     Transplant Synopsis  Transplant date: 09/01/2018  Induction immunosuppression: thymo    CMV status: D+/R+    Viral prophylaxis: valganciclovir x 3 months    PCP prophylaxis: bactrim x 1 year      Education   Michele Benitez was educated on the brand and generic medication names, as well as the medication class. The indication for treatment, dose, route, administration technique, frequency, and duration were discussed with the patient. The patient was educated on timely administration of therapy and management of missed doses. Instructions were given not to crush, chew, or split the tablets or capsules. Appropriate storage and disposal directions were reviewed.    The importance of adherence was discussed, and the patient was provided with tools to promote adherence.     Contraindications to therapy, safety precautions, and common side effects were discussed.  The patient was instructed to seek medical attention immediately if experiencing signs of an allergic reaction, including but not limited to: a rash; hives; itching; red, swollen, blistered, or peeling skin with or without fever.    Risk Evaluation and Mitigation Strategy (REMS) Assessment  No REMS are required for any of the prescribed medications for this patient.    REMS acknowledgement and documentation was completed with the patient and prescriber prior to transplantation, and the patient was reminded of the importance of pregnancy prevention while taking mycophenolate. A medication guide will be dispensed with the medication.    Safety Precautions  The patient was counseled on the risk of infection and malignancy associated with all immunosuppressive medications. The patient was instructed to seek medical attention if experiencing any significant side effects and/or signs of an infection, malignancy, and/or rejection.     Drug-Drug Interactions  Interactions between the patient's immunosuppressive medication(s) and other medications were assessed and reviewed with the patient. No new significant drug-drug or drug-food interactions were identified.    The patient was instructed to speak with a health care provider before starting any new medications, including prescription, over the counter, natural/herbal products, and/or vitamins.    Drug-Food Interactions  The patient was counseled to avoid consuming grapefruit, grapefruit juice, pomegranate and pomegranate juice.  While the medications can be taken without regard to food, the patient was encouraged to take the immunosuppressive medications with food for better tolerability.    Adherence  Medication adherence was emphasized for the prevention of organ rejection.  The patient was provided with a pillbox and medication calendar to promote adherence.     Follow-up  The patient was provided with pharmacy contact information and encouraged to contact a transplant pharmacist with any questions or concerns regarding the medication therapy.     Questions    Michele Benitez was given the opportunity to ask questions and did not have any questions at this time.    Sharlee Blew, PHARMD

## 2018-09-05 NOTE — Progress Notes
Daily Progress Note      Today's Date:  09/05/2018  Name:  Michele Benitez                       MRN:  [id number]   Admission Date: 09/01/2018 (LOS: 4 days)                     Assessment: 58 y.o. 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     4 Days Post-Op from Procedure(s) (LRB):  ALLOTRANSPLANTATION KIDNEY FROM NON LIVING DONOR WITHOUT RECIPIENT NEPHRECTOMY (N/A)    Principal Problem:    Kidney transplant candidate  Active Problems:    Hypertension    Dyslipidemia    Class 1 obesity due to excess calories with serious comorbidity and body mass index (BMI) of 30.0 to 30.9 in adult    Acute postoperative pain of abdomen    Thrombocytopenia (HCC)    Plan:  - Monitor tacro levels  - Bowel regimen  - holding ASA and DVT ppx 2/2 low platelets, has received last thymo  - PO pain control  - D/c jp today  - Dispo: plan for d/c today    _____________________________________________________________________________    Subjective:  NAEO, feeling much better this am, pain well controlled, tolerating diet    Objective:  BP: (155-173)/(86-99)   Temp:  [36.2 ???C (97.1 ???F)-36.8 ???C (98.3 ???F)]   Pulse:  [71-104]   Respirations:  [18 PER MINUTE-20 PER MINUTE]   SpO2:  [98 %-100 %]   Lab Results   Component Value Date/Time    NA 141 09/05/2018 05:50 AM    K 3.6 09/05/2018 05:50 AM    CL 104 09/05/2018 05:50 AM    CO2 22 09/05/2018 05:50 AM    BUN 73 (H) 09/05/2018 05:50 AM    CR 4.70 (H) 09/05/2018 05:50 AM    MG 2.4 09/05/2018 05:50 AM    PO4 4.1 09/05/2018 05:50 AM      Lab Results   Component Value Date/Time    HGB 7.6 (L) 09/05/2018 05:50 AM    HCT 22.0 (L) 09/05/2018 05:50 AM    WBC 5.6 09/05/2018 05:50 AM    PLTCT 55 (L) 09/05/2018 05:50 AM    INR 1.0 09/01/2018 07:33 AM     No results found for: GLUPOC     Output by Drain (mL) 08/31/18 0701 - 08/31/18 1900 08/31/18 1901 - 09/01/18 0700 09/01/18 0701 - 09/01/18 1900 09/01/18 1901 - 09/02/18 0700 09/02/18 0701 - 09/02/18 1717 Al Pimple Drain 09/01/18 1110 Anterior;Right;Lower Abdomen #1   117 14 3     Physical Exam   Constitutional: She appears well-developed and well-nourished.   HENT:   Head: Normocephalic and atraumatic.   Cardiovascular: Normal rate and regular rhythm.   Pulmonary/Chest: Effort normal. No respiratory distress.   Abdominal: Soft.   Appropriately ttp; dressing in place without saturation   Skin: Skin is warm and dry.                   Loss of Subcutaneous Fat: No      Muscle Wasting: No      Edema: No         ___________________________  Trilby Leaver, MD   Team Pager: # [phone]

## 2018-09-05 NOTE — Progress Notes
Discharge Planning Phase    Patient is POD 4, last BM 09/04/18, Foley removed, ambulating, patient's Pain is controlled without any medications. for pain control, tolerating regular diet. Patient ready for discharge.     0102 NC rounded on patient, confirmed plan for teaching today at 10.     10 Met with patient and husband Jeneen Rinks. Discussed discharge teaching including wound care, signs and symptoms of wound infection, vital sign monitoring, when to page on call RN, expectation for clinic appointment and labs, ureteral stent removal and appointment, and Prograf trough.     Stent removal appt pending.     Pt's questions answered. [name]'s questions answered.    Per team patient needs outpatient Aranesp, infusion therapy order entered.

## 2018-09-06 ENCOUNTER — Emergency Department: Admit: 2018-09-06 | Discharge: 2018-09-06 | Disposition: A | Discharge status: Home / Self Care

## 2018-09-06 ENCOUNTER — Encounter: Admit: 2018-09-06 | Discharge: 2018-09-06

## 2018-09-06 ENCOUNTER — Ambulatory Visit: Admit: 2018-09-06 | Discharge: 2018-09-07

## 2018-09-06 ENCOUNTER — Ambulatory Visit: Admit: 2018-09-06 | Discharge: 2018-09-06

## 2018-09-06 DIAGNOSIS — I10 Essential (primary) hypertension: Secondary | ICD-10-CM

## 2018-09-06 DIAGNOSIS — E785 Hyperlipidemia, unspecified: Secondary | ICD-10-CM

## 2018-09-06 DIAGNOSIS — Z940 Kidney transplant status: Secondary | ICD-10-CM

## 2018-09-06 DIAGNOSIS — L7622 Postprocedural hemorrhage and hematoma of skin and subcutaneous tissue following other procedure: Secondary | ICD-10-CM

## 2018-09-06 DIAGNOSIS — D899 Disorder involving the immune mechanism, unspecified: Secondary | ICD-10-CM

## 2018-09-06 DIAGNOSIS — N186 End stage renal disease: Secondary | ICD-10-CM

## 2018-09-06 DIAGNOSIS — Z5181 Encounter for therapeutic drug level monitoring: Secondary | ICD-10-CM

## 2018-09-06 DIAGNOSIS — R9439 Abnormal result of other cardiovascular function study: Secondary | ICD-10-CM

## 2018-09-06 LAB — CBC AND DIFF
Lab: 0 % — ABNORMAL LOW (ref >60)
Lab: 0 % — ABNORMAL LOW (ref >60)
Lab: 0 10*3/uL (ref 0–0.45)
Lab: 0.2 10*3/uL (ref 0–0.80)
Lab: 0.2 10*3/uL — ABNORMAL LOW (ref 1.0–4.8)
Lab: 18 % — ABNORMAL HIGH (ref 11–15)
Lab: 2.2 M/UL — ABNORMAL LOW (ref 4.0–5.0)
Lab: 22 % — ABNORMAL LOW (ref 36–45)
Lab: 33 pg (ref 26–34)
Lab: 34 g/dL (ref 32.0–36.0)
Lab: 4.3 10*3/uL (ref 1.8–7.0)
Lab: 4.8 K/UL (ref 4.5–11.0)
Lab: 5 % — ABNORMAL LOW (ref 24–44)
Lab: 6 % — ABNORMAL HIGH (ref 4–12)
Lab: 66 K/UL — ABNORMAL LOW (ref 150–400)
Lab: 89 % — ABNORMAL HIGH (ref 41–77)
Lab: 9.5 FL (ref 7–11)
Lab: 97 FL (ref 80–100)

## 2018-09-06 LAB — MAGNESIUM: Lab: 2.4 mg/dL — AB (ref 1.6–2.6)

## 2018-09-06 LAB — URINALYSIS MICROSCOPIC REFLEX TO CULTURE

## 2018-09-06 LAB — PROTEIN/CR RATIO,UR RAN
Lab: 145 mg/dL
Lab: 87 mg/dL

## 2018-09-06 LAB — TACROLIMUS LC-MS/MS: Lab: 12 g/dL — ABNORMAL LOW (ref 12.0–15.0)

## 2018-09-06 LAB — URINALYSIS DIPSTICK REFLEX TO CULTURE

## 2018-09-06 LAB — COMPREHENSIVE METABOLIC PANEL
Lab: 141 MMOL/L (ref 137–147)
Lab: 3.9 MMOL/L — AB (ref 3.5–5.1)

## 2018-09-06 LAB — PHOSPHORUS: Lab: 3.2 mg/dL — AB (ref 2.0–4.5)

## 2018-09-06 LAB — URIC ACID: Lab: 9.5 mg/dL — ABNORMAL HIGH (ref 2.0–7.0)

## 2018-09-06 NOTE — ED Notes
Transplant surgery MDs in ED room.

## 2018-09-06 NOTE — Progress Notes
Transplant Nephrology Clinic Progress Note     Date of Service: 09/06/2018         Michele Benitez is a 58 y.o. 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  Donor: DCD 6th decade of life, Ht 160 cm. Wt 85 kg. COD: Cardiovascular/Anoxia, Creatinine Initial/Terminal. 1/1.24  Right kidney- 2% GS. Mild moderate IF and mild vascular chronicity   KDPI: 75%, cPRA:??? 0%, Historical cPRA 7%  Match: XM -ve, no DSA  CMV: D+/R+  Induction: thymoglobulin 3.8 mg/kg   Maintenance Immunosuppression: Triple drug therapy  Mild injury to renal artery with anastomosis, expect velocities to be high- started on ASA post op.   Post OP: Uncomplicated CIT: 16 Hr???????????? WIT:??? 35 Min  Studies: None  ???      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 The patient has been doing well since discharged on 09/05/18. She is accompanied by her spouse.     Home blood pressure last night in the ER was 170/100, she remains on Carvedilol. Similar to readings today in clinic, however she has not taken her Carvedilol.    Her urine output has not been measured but she does report she has urinated several times.    She presented to the ER last night due to concern about JP drain exit site bleeding.    Michele Benitez denies hospital admissions or infections since last seen. There are no urinary concerns or GI distress with the current immunosuppression regimen and reports compliance with medications.        Comprehensive 14-point ROS reviewed:    Constitutional/General: Negative for fever, chills, weakness, anorexia, fatigue, malaise or unintentional weight loss/weight gain. As per HPI  HEENT:  Negative for headache, diplopia, tinnitus, rhinorrhea, epistaxis, sore throat, mouth ulcers  Lymphatics: Negative for enlarged lymph nodes  Respiratory: Negative for cough, SOA, DOE  Cardiovascular: Negative for chest pain, SOA, palpitations, orthopnea, PND, swelling  Gastrointestinal: Negative for abdominal pain, nausea, vomiting, diarrhea, constipation  Genitourinary: Negative for flank pain, burning micturition, frequency, bloody urine, oliguria  Extremities: Negative for leg swelling  M/S: Negative for joint swelling, redness, pain  Skin: Negative for rash, itching or lesions  Endocrine: Negative for sweating, cold or heat intolerance. No polyuria or polydipsia  Neurologic: Negative for muscle weakness, numbness, tingling.  Hematologic: Negative for anemia, bleeding or bruising  Psychiatric: Negative for thought/mood disorder  Allergic/Immunologic: Negative for hives, pruritus      Pertinent positive and negative systems are noted above and/or in HPI      No Known Allergies        Medical History:   Diagnosis Date   ??? Abnormal stress test 12/03/2015   ??? Dyslipidemia 07/18/2014   ??? ESRD (end stage renal disease) (HCC) 12/03/2015   ??? Hyperlipemia    ??? Hyperphosphatemia 07/18/2014   ??? Hypertension 07/18/2014          Surgical History:   Procedure Laterality Date   ??? Left Heart Catheterization With Ventriculogram Left 12/03/2015    Performed by Harley Alto, MD at [facility] CATH LAB   ??? Coronary Angiography N/A 12/03/2015    Performed by Harley Alto, MD  at [facility] CATH LAB   ??? Possible Percutaneous Coronary Intervention N/A 12/03/2015    Performed by Harley Alto, MD at [facility] CATH LAB   ??? ALLOTRANSPLANTATION KIDNEY FROM NON LIVING DONOR WITHOUT RECIPIENT NEPHRECTOMY N/A 09/01/2018    Performed by York Cerise, MD at [facility] OR          Family History   Problem Relation Age of Onset   ??? Heart Failure Mother    ??? Alcohol abuse Mother    ??? Cancer Father         colon         Social History     Tobacco Use   ??? Smoking status: Never Smoker   ??? Smokeless tobacco: Never Used   Substance Use Topics ??? Alcohol use: No   ??? Drug use: No   -    Objective:       Physical exam-  Vitals:    09/06/18 0913 09/06/18 0916   BP: (!) 173/90 (!) 147/79   BP Source: Arm, Right Upper Arm, Right Upper   Patient Position: Sitting Standing   Pulse: 75 82   Temp: 36.8 ???C (98.2 ???F)    TempSrc: Oral    SpO2: 97%    Weight: 82.5 kg (181 lb 12.8 oz)    Height: 167.6 cm (66)    PainSc: Two    Body mass index is 29.34 kg/m???.        General: NAD, A+Ox4, calm and pleasant. Appears to be stated age.   HENT: Unremarkable, no oral lesions  Neck: Normal ROM, no LAD, no JVD  Lungs: Bilat. CTA  CV: RRR, S1, S2 without carotid bruit or murmur, no edema  Abdomen: Soft, N/D, N/T without hepatosplenomegaly, central obesity, pannus  Incision: RLQ staples  Ext: Left FA fistula with ecchymosis.  M/S: Normal ROM and strength  Neuro: Nonfocal deficits without tremors  Skin: No skin rash  Psyc: Stable        ??? acetaminophen (TYLENOL) 500 mg tablet Take 500 mg by mouth every 6 hours as needed for Pain. Max of 4,000 mg of acetaminophen in 24 hours.   ??? aspirin EC 81 mg tablet Do not take until directed by transplant clinic   ??? carvediloL (COREG) 25 mg tablet Take one tablet by mouth twice daily.   ??? mycophenolate DR (MYFORTIC) 180 mg TbEC tablet Take four tablets by mouth twice daily.   ??? nystatin (MYCOSTATIN) 100,000 units/mL oral suspension Swish and Swallow 5 mL by mouth as directed four times daily.   ??? pantoprazole DR (PROTONIX) 40 mg tablet Take one tablet by mouth daily.   ??? polyethylene glycol 3350 (MIRALAX) 17 gram/dose powder Take seventeen g by mouth daily as needed for Constipation.   ??? predniSONE (DELTASONE) 5 mg tablet 6/16: take 4 tabs (20 mg) by mouth daily, 6/17: take 3 tabs (15 mg) daily, 6/18: take 2 tabs (10mg ) daily, 6/19 and thereafter: take 1 tab (5mg ) daily   ??? senna/docusate (SENOKOT-S) 8.6/50 mg tablet Take one tablet to two tablets by mouth twice daily as needed. ??? simvastatin (ZOCOR) 20 mg tablet Take 20 mg by mouth at bedtime daily.   ??? tacrolimus (PROGRAF) 1 mg capsule Take four capsules by mouth daily with breakfast AND three capsules at bedtime daily.   ??? trimethoprim/sulfamethoxazole (BACTRIM) 80/400 mg tablet Take one tablet by mouth daily.   ??? valGANciclovir (VALCYTE) 450 mg tablet Take one tablet by mouth twice weekly. On Monday and Thursday  Labs and Diagnostic Tests:    Urinalysis:  Lab Results   Component Value Date/Time    UCOLOR STRAW 09/01/2018 07:01 AM    TURBID CLEAR 09/01/2018 07:01 AM    USPGR 1.016 09/01/2018 07:01 AM    UPH 8.0 09/01/2018 07:01 AM    UPROTEIN 2+ (A) 09/01/2018 07:01 AM    UAGLU 1+ (A) 09/01/2018 07:01 AM    UKET NEG 09/01/2018 07:01 AM    UBILE NEG 09/01/2018 07:01 AM    UBLD NEG 09/01/2018 07:01 AM    UROB NORMAL 09/01/2018 07:01 AM       CBC with Diff:  CBC with Diff Latest Ref Rng & Units 09/06/2018 09/05/2018   WBC 4.5 - 11.0 K/UL 4.8 5.6   RBC 4.0 - 5.0 M/UL 2.26(L) 2.25(L)   HGB 12.0 - 15.0 GM/DL 7.5(L) 7.6(L)   HCT 36 - 45 % 22.0(L) 22.0(L)   MCV 80 - 100 FL 97.5 97.8   MCH 26 - 34 PG 33.2 33.9   MCHC 32.0 - 36.0 G/DL 54.0 98.1   RDW 11 - 15 % 18.1(H) 18.2(H)   PLT 150 - 400 K/UL 66(L) 55(L)   MPV 7 - 11 FL 9.5 8.8   NEUT 41 - 77 % - 95(H)   ANC 1.8 - 7.0 K/UL - 5.31   LYMA 24 - 44 % - 2(L)   ALYM 1.0 - 4.8 K/UL - 0.11(L)   MONA 4 - 12 % - 3(L)   AMONO 0 - 0.80 K/UL - 0.19   EOSA 0 - 5 % - 0   AEOS 0 - 0.45 K/UL - 0.00   BASA 0 - 2 % - 0   ABAS 0 - 0.20 K/UL - 0.00       CMP:  CMP Latest Ref Rng & Units 09/06/2018 09/05/2018   NA 137 - 147 MMOL/L 141 141   K 3.5 - 5.1 MMOL/L 3.9 3.6   CL 98 - 110 MMOL/L 105 104   CO2 21 - 30 MMOL/L 22 22   GAP 3 - 12 14(H) 15(H)   BUN 7 - 25 MG/DL 19(J) 47(W)   CR 0.4 - 1.00 MG/DL 2.95(A) 2.13(Y)   GLUX 70 - 100 MG/DL 865(H) 846(N)   CA 8.5 - 10.6 MG/DL 9.4 9.2   TP 6.0 - 8.0 G/DL 6.2 6.0   ALB 3.5 - 5.0 G/DL 3.8 3.5   ALKP 25 - 629 U/L 82 83   ALT 7 - 56 U/L 9 5(L) TBILI 0.3 - 1.2 MG/DL 0.6 0.5   GFR >52 mL/min 13(L) 10(L)   GFRAA >60 mL/min 15(L) 12(L)         Other Common Labs:  Other Common Labs Latest Ref Rng & Units 09/06/2018 09/05/2018   IRON 50 - 160 MCG/DL - -   TIBC 841 - 324 MCG/DL - -   PSAT 28 - 42 % - -   FER 10 - 200 NG/ML - -   PO4 2.0 - 4.5 MG/DL 3.2 4.1   MG 1.6 - 2.6 mg/dL 2.4 2.4   UPRO NEG-NEG - -   URICA 2.0 - 7.0 MG/DL 4.0(N) -   UUV2Z 4.0 - 6.0 % - -   Tacrolimus 2 - 15 NG/ML - 20.4(H)       Imaging:    Last CXR 2 View:  Results for orders placed or performed during the hospital encounter of 09/01/18   CHEST 2 VIEWS    Narrative    CHEST  2 VIEWS     INDICATION: Shortness of breath.    COMPARISON STUDY: Chest radiographs from 08/28/2018.    FINDINGS:    Lungs: The lung volume is normal. No focal airspace disease. Persistent 1.4 cm nodular opacity seen anteriorly on the lateral projection overlying the aortic arch.    Pleura: No pleural effusion or pneumothorax.    Heart and Mediastinum: Stable mild enlargement of the cardiac silhouette.    Skeletal Structures and Soft Tissues: Stable regional skeleton with convex right midthoracic spine curvature.      Impression    1.  No  consolidation or pleural effusion  2.  Persistent nodule seen anteriorly on the lateral projection, which may represent a calcified nodule. Recommend nonemergent noncontrast CT chest to evaluate for underlying noncalcified pulmonary or mediastinal nodule. This was previously communicated to the clinical team.    By my electronic signature, I attest that I have personally reviewed the images for this examination and formulated the interpretations and opinions expressed in this report       Finalized by Delmar Landau, M.D. on 09/01/2018 11:46 AM. Dictated by Gwinda Passe, MD on 09/01/2018 10:57 AM.           Results for orders placed during the hospital encounter of 09/01/18   US KIDNEY TRANSPLANT    Narrative Ultrasound of transplant kidney Clinical Indication: Female, 58 year old; post kidney transplant evaluation,     Technique: Multiple grayscale, color Doppler and spectral Doppler ultrasound images were obtained of the renal transplant.    Comparison: Ultrasound September 01, 2018    Findings:    The right lower quadrant renal transplant is normal in size, measuring 11.4 x 5.5 cm.  There is no hydronephrosis.  No peritransplant fluid collections are identified.  Previously noted punctate foci in the renal collecting system are no longer visualized and likely represented gas.    There is persistent subjectively good blood flow to the renal cortex on color Doppler interrogation. Intrarenal resistive indices are low to lower limits of normal, ranging 0.48-0.56, previously 0.44-0.52, with persistent mild systolic acceleration delay.    The main renal artery is patent without visible stenosis. Initial images demonstrate a vessel in the region of the main renal artery which is not well seen on color Doppler evaluation likely represents an adjacent branch vessel. Peak systolic velocity of the main renal artery is 50 cm/s cm/sec (series 1aA), previously 64 cm/s. There is persistent mild systolic acceleration delay in the mid to distal vessel. The transplant renal vein is patent.    The iliac artery and vein are patent with normal direction of blood flow.  Peak systolic velocity of the iliac artery is 63 cm/sec.    The urinary bladder is decompressed by a Foley catheter. Transplant ureteral stent is partially visualized.      Impression 1.  Persistent low resistance blood flow and systolic acceleration delay throughout the transplant kidney and mid to distal transplant renal artery. These findings remain indeterminate and may represent stenosis or other compromise of the main renal artery (such as dissection given reported arterial repair), although, there is no significant velocity elevation to suggest high-grade stenosis. Attention on short-term follow-up in 12 to 24 hours is recommended.  2.  No hydronephrosis.    By my electronic signature, I attest that I have personally reviewed the images for this examination and formulated the interpretations and opinions expressed in this report       Finalized by Jason Coop, M.D. on 09/02/2018 5:00  AM. Dictated by Delfin Gant, M.D. on 09/02/2018 3:41 AM.           Results for orders placed during the hospital encounter of 10/29/15   CT ABD/PELV WO CONTRAST    Narrative CT ABDOMEN AND PELVIS    Clinical Indication:  Female, 58 years old. Pretransplant evaluation for end stage renal disease.    Technique: Multiple contiguous axial CT images were obtained through the abdomen and pelvis without IV contrast. Post processing coronal and sagittal reconstruction images were made from the axial images.    IV contrast: None.  Bowel contrast:  None    Comparison: CT abdomen pelvis November 21, 2013    FINDINGS:    Limited evaluation without the use of IV contrast which includes the viscera and vasculature.    Lower Thorax: Stable left lower lobe scarring. Heart size is normal.    Liver and Biliary system: Upper limits of normal liver size. No biliary ductal dilatation.      Spleen: Unremarkable.    Adrenal Glands and Kidneys: Adrenal glands are unremarkable. Progressive bilateral renal atrophy with areas of renal cortical scarring. There is a probable right lower pole renal cyst. No hydronephrosis is noted.    Pancreas and Retroperitoneum: Pancreas is unremarkable. Decrease in size of prominent retroperitoneal lymph nodes compared to prior exam.    Aorta and Major Vessels: The abdominal aorta is normal caliber with minimal calcified plaque noted. Common and external iliac arteries are normal in caliber with only minimal calcified plaque.    Bowel, Mesentery and Peritoneal space: The small and large bowel loops are normal in caliber. No ascites. Pelvis: The bladder is decompressed. Uterus is stable in appearance and contains small calcifications, likely fibroids.    Abdominal wall and Osseous Structures: There is a small fat-containing umbilical hernia. Multilevel lumbar disc degeneration, greatest at L4-L5.      Impression 1. Minimal aortoiliac calcific plaque.  2. Progression of bilateral renal atrophy and cortical scarring. No hydronephrosis.         Approved by Marcelline Mates, M.D. on 10/29/2015 2:00 PM    By my electronic signature, I attest that I have personally reviewed the images for this examination and formulated the interpretations and opinions expressed in this report       Finalized by Caro Hight, M.D. on 10/29/2015 4:53 PM. Dictated by Marcelline Mates, M.D. on 10/29/2015 12:59 PM.               Assessment and Plan:    Ms. Vesper with history of ESRD 2/2 HTN s/p a renal transplant for which we are consulted. cPRA 0%, KDPI 75%  ???  #) Immunosuppression - On maintenance Triple drug therapy  - Thymo 150/100/100. Total of 3.8 mg/kg, given thrombocytopenia   - Goal tacrolimus trough first 3 months after transplant is 10-15 ng/mL (MEIA) or 8-12 mcg/L (HPLC/LCMS).  - Will reduce Prograf 4/3mg   - MPA 720mg  BID and steroid pulse to wean to prednisone 5 mg daily.  ???  #) Deceased donor renal transplant: bl Cr tbd mg/dL, bl UPCR tbd gm/gm  - Renal graft function: excellent uop, with gradual improvement in scr , currently 3.74 mg/dl, BUN 79  - Will hold aspirin until resolution of thrombocytopenia      #) ID Proph  - CMV Donor(+)/ Recipient(+) to treat with Valcyte for 3 months post transplant  - PJP Prophylax: To treat with Bactrim for 1 year post transplant  - Nystatin swish for 2 weeks post transplant    #)  Incisional Wound- RLQ healing with staples. Keep staples for 3 weeks post transplant. JP exit with saturated dressing, no drainage once wound cleaned and applied steri strip for reinforcement. Educated pt and spouse about reportable s/s. [pt presented to ER yesterday for concerns of JP drain leaking]  ???  #) HTN: Elevated at 170/90s-plan to add nifedipine if does not improve with future plan for ACE-I.  ???  #) Glycemia - BGs have been trending up on labs, monitor   ???  #) Anemia - Hgb today is 7.5  - Iron stores well replete [09/03/18]  - Arrange for Epogen  - denies hematuria, hemoptysis or melena  ???  #) Volume/Electrolytes:   Stable K+, Mg, PO4      RTC next week  Update labs twice weekly    Martyn Ehrich, APRN  Transplant Nephrology Nurse Practitioner  [facility]  [phone]  [phone]        Cc: [name]  Cc: Rockwell Germany                      Please contact the [facility] at  [phone] for any transplant related questions or concerns that may arise.

## 2018-09-06 NOTE — Telephone Encounter
Patient notified to have Aranesp 09/08/18 at 745 and will have labs drawn at Infusion.

## 2018-09-06 NOTE — ED Notes
Discharge instructions provided. Pt verbalized understanding, no further questions/concerns. Pt is AOx4, ABCs intact. Pt ambulated out of ED w/ steady gait.

## 2018-09-06 NOTE — Consults
[facility] [place] [facility]plant [facility] Consult  09/06/2018     Patient: Michele Benitez  MRN: [id number]    Admission Date:  09/06/2018, LOS: 0 days  Admission Diagnosis: No admission diagnoses are documented for this encounter.  Date of Service: September 06, 2018    Reason for Consult: drain site leakage   Referring Provider: Elsie Amis, MD  Attending Surgeon: Nicholos Johns, MD  Consult Performed by: Rush Farmer, DO    ASSESSMENT: 58 y.o. female with recent kidney transplant, discharged yesterday after JP drain removed, presented to ED with concern about JP drain site leaking    OR date:   Active Problems:    * No active hospital problems. *      PLAN:  JP drain site normal in appearance. Explained to patient that some leakage is normal, especially when she moves and shifts her weight  Ok to d/c from ED    Will discuss with Dr. Caprice Kluver in am  __________________________________________________________________________________    HPI: Michele Benitez is a 58 y.o. female w DDRT 09/01/2018. Doing well. D/c'd yesterday. JP drain was pulled yesterday prior to d/c and this evening they noticed some increased dark serosanguinous drainage from insertion site, which worried them, so they presented to ED. Otherwise doing well, no pain, nausea, vomiting. Urinating well.     Medical History:   Diagnosis Date   ??? Abnormal stress test 12/03/2015   ??? Dyslipidemia 07/18/2014   ??? ESRD (end stage renal disease) (HCC) 12/03/2015   ??? Hyperlipemia    ??? Hyperphosphatemia 07/18/2014   ??? Hypertension 07/18/2014     No current facility-administered medications on file prior to encounter.      Current Outpatient Medications on File Prior to Encounter   Medication Sig Dispense Refill   ??? acetaminophen (TYLENOL) 500 mg tablet Take 500 mg by mouth every 6 hours as needed for Pain. Max of 4,000 mg of acetaminophen in 24 hours.     ??? aspirin EC 81 mg tablet Do not take until directed by transplant clinic ??? carvediloL (COREG) 25 mg tablet Take one tablet by mouth twice daily. 60 tablet 6   ??? mycophenolate DR (MYFORTIC) 180 mg TbEC tablet Take four tablets by mouth twice daily. 240 tablet 11   ??? nystatin (MYCOSTATIN) 100,000 units/mL oral suspension Swish and Swallow 5 mL by mouth as directed four times daily. 200 mL 0   ??? pantoprazole DR (PROTONIX) 40 mg tablet Take one tablet by mouth daily. 30 tablet 11   ??? polyethylene glycol 3350 (MIRALAX) 17 gram/dose powder Take seventeen g by mouth daily as needed for Constipation. 510 g 11   ??? predniSONE (DELTASONE) 5 mg tablet 6/16: take 4 tabs (20 mg) by mouth daily, 6/17: take 3 tabs (15 mg) daily, 6/18: take 2 tabs (10mg ) daily, 6/19 and thereafter: take 1 tab (5mg ) daily 36 tablet 3   ??? senna/docusate (SENOKOT-S) 8.6/50 mg tablet Take one tablet to two tablets by mouth twice daily as needed. 120 tablet 11   ??? simvastatin (ZOCOR) 20 mg tablet Take 20 mg by mouth at bedtime daily.     ??? tacrolimus (PROGRAF) 1 mg capsule Take four capsules by mouth daily with breakfast AND three capsules at bedtime daily. 480 capsule 11   ??? trimethoprim/sulfamethoxazole (BACTRIM) 80/400 mg tablet Take one tablet by mouth daily. 30 tablet 11   ??? valGANciclovir (VALCYTE) 450 mg tablet Take one tablet by mouth twice weekly. On Monday and Thursday 60 tablet 2  Surgical History:   Procedure Laterality Date   ??? Left Heart Catheterization With Ventriculogram Left 12/03/2015    Performed by Harley Alto, MD at [facility] CATH LAB   ??? Coronary Angiography N/A 12/03/2015    Performed by Harley Alto, MD at [facility] CATH LAB   ??? Possible Percutaneous Coronary Intervention N/A 12/03/2015    Performed by Harley Alto, MD at [facility] CATH LAB   ??? ALLOTRANSPLANTATION KIDNEY FROM NON LIVING DONOR WITHOUT RECIPIENT NEPHRECTOMY N/A 09/01/2018    Performed by York Cerise, MD at [facility] OR     Family History   Problem Relation Age of Onset   ??? Heart Failure Mother    ??? Alcohol abuse Mother    ??? Cancer Father         colon Social History     Socioeconomic History   ??? Marital status: Married     Spouse name: Not on file   ??? Number of children: Not on file   ??? Years of education: Not on file   ??? Highest education level: Not on file   Occupational History   ??? Not on file   Tobacco Use   ??? Smoking status: Never Smoker   ??? Smokeless tobacco: Never Used   Substance and Sexual Activity   ??? Alcohol use: No   ??? Drug use: No   ??? Sexual activity: Not on file   Other Topics Concern   ??? Not on file   Social History Narrative   ??? Not on file       ROS:  ROS  12 point review of systems was completed and found to be negative other than documented above in HPI       Vitals:  BP: (168-173)/(90-100)   Temp:  [36.2 ???C (97.1 ???F)-36.8 ???C (98.3 ???F)]   Pulse:  [71-97]   Respirations:  [18 PER MINUTE-20 PER MINUTE]   SpO2:  [98 %-100 %]     Physical Exam:   Physical Exam  GEN: alert and oriented no acute distress  HEENT: NCAT  CARDIO: RRR, peripheral pulses wnl  PULM: normal work of breathing, no stridor, on RA  ABD: soft, NTND, RLQ transplant incision c/d/i with staples in place. Minimal surrounding ecchymosis, improving. JP drain site with no drainage. Clean appearing.   EXT: no c/c/e, warm, well-perfused  NEURO: motor and sensation grossly intact  INTEG: warm, dry, appropriate for age and race  PSYCH: cooperative, appropriate mood and affect      Lab/Radiology/Other Diagnostic Tests:  Lab Results   Component Value Date/Time    HGB 7.6 (L) 09/05/2018 0550    WBC 5.6 09/05/2018 0550     Lab Results   Component Value Date/Time    NA 141 09/05/2018 0550    K 3.6 09/05/2018 0550    CL 104 09/05/2018 0550    CO2 22 09/05/2018 0550    BUN 73 (H) 09/05/2018 0550    CR 4.70 (H) 09/05/2018 0550        Marion Downer, DO  General Surgery PGY4  Team pager [phone]

## 2018-09-07 ENCOUNTER — Encounter: Admit: 2018-09-07 | Discharge: 2018-09-07

## 2018-09-07 NOTE — Progress Notes
Office visit note from 09/06/18 reviewed.  Aranesp injection scheduled for 09/08/18.

## 2018-09-08 ENCOUNTER — Ambulatory Visit: Admit: 2018-09-08 | Discharge: 2018-09-09

## 2018-09-08 ENCOUNTER — Encounter: Admit: 2018-09-08 | Discharge: 2018-09-08

## 2018-09-08 DIAGNOSIS — I10 Essential (primary) hypertension: Secondary | ICD-10-CM

## 2018-09-08 DIAGNOSIS — N186 End stage renal disease: Secondary | ICD-10-CM

## 2018-09-08 DIAGNOSIS — R9439 Abnormal result of other cardiovascular function study: Secondary | ICD-10-CM

## 2018-09-08 DIAGNOSIS — E785 Hyperlipidemia, unspecified: Secondary | ICD-10-CM

## 2018-09-08 MED ORDER — DARBEPOETIN ALFA IN POLYSORBAT 40 MCG/0.4 ML IJ SYRG
40 ug | Freq: Once | SUBCUTANEOUS | 0 refills | Status: CN
Start: 2018-09-08 — End: ?

## 2018-09-08 MED ORDER — DARBEPOETIN ALFA IN POLYSORBAT 40 MCG/0.4 ML IJ SYRG
40 ug | Freq: Once | SUBCUTANEOUS | 0 refills | Status: CP
Start: 2018-09-08 — End: ?
  Administered 2018-09-08: 13:00:00 40 ug via SUBCUTANEOUS

## 2018-09-08 NOTE — Progress Notes
Patient tolerated injection without complaint. Ambulatory out of dept with spouse.

## 2018-09-08 NOTE — Patient Instructions
Post infusion emergency medical treatment: Go to the closest Emergency Department or call 911. For non-urgent questions or concerns, call [phone] after 0900 the following day(including weekends & holidays). For urgent medical questions, call [phone] and ask to speak to the On-Call Physician covering for the physician prescribing your infusion medication.

## 2018-09-09 ENCOUNTER — Ambulatory Visit: Admit: 2018-09-09 | Discharge: 2018-09-09

## 2018-09-09 ENCOUNTER — Encounter: Admit: 2018-09-09 | Discharge: 2018-09-09

## 2018-09-09 DIAGNOSIS — N189 Chronic kidney disease, unspecified: Secondary | ICD-10-CM

## 2018-09-09 DIAGNOSIS — Z5181 Encounter for therapeutic drug level monitoring: Secondary | ICD-10-CM

## 2018-09-09 DIAGNOSIS — D631 Anemia in chronic kidney disease: Secondary | ICD-10-CM

## 2018-09-09 DIAGNOSIS — Z940 Kidney transplant status: Secondary | ICD-10-CM

## 2018-09-09 LAB — CBC AND DIFF
Lab: 1 %
Lab: 1 % — ABNORMAL LOW (ref >60)
Lab: 1 % — ABNORMAL LOW (ref >60)
Lab: 100 FL — ABNORMAL HIGH (ref 80–100)
Lab: 113 10*3/uL — ABNORMAL LOW (ref 150–400)
Lab: 13 % — ABNORMAL HIGH (ref 4–12)
Lab: 18 % — ABNORMAL HIGH (ref 11–15)
Lab: 2.2 M/UL — ABNORMAL LOW (ref 4.0–5.0)
Lab: 22 % — ABNORMAL LOW (ref 36–45)
Lab: 34 g/dL (ref 32.0–36.0)
Lab: 34 pg — ABNORMAL HIGH (ref 26–34)
Lab: 4 % — ABNORMAL LOW (ref 24–44)
Lab: 5.1 10*3/uL (ref 1.8–7.0)
Lab: 6.4 10*3/uL (ref 4.5–11.0)
Lab: 8.9 FL (ref 7–11)
Lab: 80 % — ABNORMAL HIGH (ref 41–77)

## 2018-09-09 LAB — URINALYSIS MICROSCOPIC REFLEX TO CULTURE

## 2018-09-09 LAB — URINALYSIS DIPSTICK REFLEX TO CULTURE
Lab: NEGATIVE
Lab: NEGATIVE
Lab: NEGATIVE
Lab: NEGATIVE

## 2018-09-09 LAB — MAGNESIUM: Lab: 1.9 mg/dL (ref >60)

## 2018-09-09 LAB — COMPREHENSIVE METABOLIC PANEL
Lab: 141 MMOL/L (ref 137–147)
Lab: 4.3 MMOL/L (ref 3.5–5.1)

## 2018-09-09 LAB — URIC ACID: Lab: 8.7 mg/dL — ABNORMAL HIGH (ref 2.0–7.0)

## 2018-09-09 LAB — PHOSPHORUS: Lab: 3 mg/dL (ref 2.0–4.5)

## 2018-09-09 LAB — PROTEIN/CR RATIO,UR RAN
Lab: 88 mg/dL
Lab: 99 mg/dL

## 2018-09-10 LAB — TACROLIMUS LC-MS/MS: Lab: 18 g/dL — ABNORMAL LOW (ref 12.0–15.0)

## 2018-09-11 ENCOUNTER — Encounter: Admit: 2018-09-11 | Discharge: 2018-09-11

## 2018-09-11 ENCOUNTER — Emergency Department: Admit: 2018-09-12 | Discharge: 2018-09-12

## 2018-09-11 DIAGNOSIS — N186 End stage renal disease: Secondary | ICD-10-CM

## 2018-09-11 DIAGNOSIS — I10 Essential (primary) hypertension: Secondary | ICD-10-CM

## 2018-09-11 DIAGNOSIS — R9439 Abnormal result of other cardiovascular function study: Secondary | ICD-10-CM

## 2018-09-11 DIAGNOSIS — Z4889 Encounter for other specified surgical aftercare: Secondary | ICD-10-CM

## 2018-09-11 DIAGNOSIS — Z09 Encounter for follow-up examination after completed treatment for conditions other than malignant neoplasm: Secondary | ICD-10-CM

## 2018-09-11 DIAGNOSIS — E785 Hyperlipidemia, unspecified: Secondary | ICD-10-CM

## 2018-09-12 ENCOUNTER — Emergency Department: Admit: 2018-09-12 | Discharge: 2018-09-12

## 2018-09-12 ENCOUNTER — Emergency Department: Admit: 2018-09-12 | Discharge: 2018-09-12 | Disposition: A | Discharge status: Home / Self Care

## 2018-09-12 DIAGNOSIS — Z09 Encounter for follow-up examination after completed treatment for conditions other than malignant neoplasm: Principal | ICD-10-CM

## 2018-09-12 DIAGNOSIS — Z4889 Encounter for other specified surgical aftercare: Secondary | ICD-10-CM

## 2018-09-12 NOTE — Consults
[facility] Consult  09/12/2018    Patient: Michele Benitez  MRN: [id number]    Admission Date:  09/12/2018, LOS: 0 days  Admission Diagnosis: No admission diagnoses are documented for this encounter.  Date of Service: September 12, 2018  Referring Provider: Carron Curie, DO  Attending Surgeon: Nicholos Johns, MD     Reason for Consult: Post op problem    ASSESSMENT: 58 y.o. female with recent kidney transplant, discharged yesterday after JP drain removed, presented to ED with concern about incisonal site leaking    PLAN:  - All pertinent labs and imaging studies reviewed  - [place] of transplanted kidney to evaluate for hematoma.   - Follow up with Dr Curt Bears in clinic on 6/23.   - Dispo pending ultrasound    Neta Ehlers, MD  Pager: # [phone]       Discussed plan of care with staff surgeon.  __________________________________________________________________________________    HPI: Michele Benitez is a 58 y.o. female  with recent kidney transplant, discharged yesterday after JP drain removed, presented to ED with concern about incisonal site leaking. She says that her incision has been leaking when she gets up and goes to the bathroom. It is draining small amounts of serosanguinous fluid. She is making 1L or more of urine per day. She denies other symptoms.    Medical History:   Diagnosis Date   ??? Abnormal stress test 12/03/2015   ??? Dyslipidemia 07/18/2014   ??? ESRD (end stage renal disease) (HCC) 12/03/2015   ??? Hyperlipemia    ??? Hyperphosphatemia 07/18/2014   ??? Hypertension 07/18/2014     Surgical History:   Procedure Laterality Date   ??? Left Heart Catheterization With Ventriculogram Left 12/03/2015    Performed by Harley Alto, MD at [facility] CATH LAB   ??? Coronary Angiography N/A 12/03/2015    Performed by Harley Alto, MD at [facility] CATH LAB   ??? Possible Percutaneous Coronary Intervention N/A 12/03/2015    Performed by Harley Alto, MD at [facility] CATH LAB   ??? ALLOTRANSPLANTATION KIDNEY FROM NON LIVING DONOR WITHOUT RECIPIENT NEPHRECTOMY N/A 09/01/2018    Performed by York Cerise, MD at [facility] OR       Medications:  No current facility-administered medications on file prior to encounter.      Current Outpatient Medications on File Prior to Encounter   Medication Sig Dispense Refill   ??? acetaminophen (TYLENOL) 500 mg tablet Take 500 mg by mouth every 6 hours as needed for Pain. Max of 4,000 mg of acetaminophen in 24 hours.     ??? aspirin EC 81 mg tablet Do not take until directed by transplant clinic     ??? carvediloL (COREG) 25 mg tablet Take one tablet by mouth twice daily. 60 tablet 6   ??? mycophenolate DR (MYFORTIC) 180 mg TbEC tablet Take four tablets by mouth twice daily. 240 tablet 11   ??? nystatin (MYCOSTATIN) 100,000 units/mL oral suspension Swish and Swallow 5 mL by mouth as directed four times daily. 200 mL 0   ??? pantoprazole DR (PROTONIX) 40 mg tablet Take one tablet by mouth daily. 30 tablet 11   ??? polyethylene glycol 3350 (MIRALAX) 17 gram/dose powder Take seventeen g by mouth daily as needed for Constipation. 510 g 11   ??? predniSONE (DELTASONE) 5 mg tablet 6/16: take 4 tabs (20 mg) by mouth daily, 6/17: take 3 tabs (15 mg) daily, 6/18: take 2 tabs (10mg ) daily, 6/19 and thereafter: take 1 tab (5mg )  daily 36 tablet 3   ??? senna/docusate (SENOKOT-S) 8.6/50 mg tablet Take one tablet to two tablets by mouth twice daily as needed. 120 tablet 11   ??? simvastatin (ZOCOR) 20 mg tablet Take 20 mg by mouth at bedtime daily.     ??? tacrolimus (PROGRAF) 1 mg capsule Take four capsules by mouth daily with breakfast AND three capsules at bedtime daily. 480 capsule 11   ??? trimethoprim/sulfamethoxazole (BACTRIM) 80/400 mg tablet Take one tablet by mouth daily. 30 tablet 11   ??? valGANciclovir (VALCYTE) 450 mg tablet Take one tablet by mouth twice weekly. On Monday and Thursday 60 tablet 2       Allergies:  Patient has no known allergies.    Social History     Socioeconomic History   ??? Marital status: Married     Spouse name: Not on file ??? Number of children: Not on file   ??? Years of education: Not on file   ??? Highest education level: Not on file   Occupational History   ??? Not on file   Tobacco Use   ??? Smoking status: Never Smoker   ??? Smokeless tobacco: Never Used   Substance and Sexual Activity   ??? Alcohol use: No   ??? Drug use: No   ??? Sexual activity: Not on file   Other Topics Concern   ??? Not on file   Social History Narrative   ??? Not on file     Family History   Problem Relation Age of Onset   ??? Heart Failure Mother    ??? Alcohol abuse Mother    ??? Cancer Father         colon       Review of Systems   Constitutional: Negative for chills and fever.   HENT: Negative for hearing loss and tinnitus.    Eyes: Negative for blurred vision and double vision.   Respiratory: Negative for cough and shortness of breath.    Cardiovascular: Negative for chest pain and palpitations.   Gastrointestinal: Negative for heartburn, nausea and vomiting.   Genitourinary: Negative for dysuria and urgency.   Musculoskeletal: Negative for myalgias and neck pain.   Skin: Negative for itching and rash.   Neurological: Negative for dizziness and headaches.   Psychiatric/Behavioral: Negative for depression and suicidal ideas.       Vitals:  Vital Signs: Last Filed In 24 Hours Vital Signs: 24 Hour Range   BP: 138/65 (06/21 2249)  Temp: 36.7 ???C (98 ???F) (06/21 2249)  Respirations: 24 PER MINUTE (06/21 2249)  SpO2: 100 % (06/21 2249)  SpO2 Pulse: 72 (06/21 2249)  Height: 167.6 cm (66) (06/21 2249) BP: (138)/(65)   Temp:  [36.7 ???C (98 ???F)]   Respirations:  [24 PER MINUTE]   SpO2:  [100 %]           Intake/Output:  No intake or output data in the 24 hours ending 09/12/18 0036    Physical Exam:   Gen: A&Ox3, NAD  HEENT: Normocephalic, atraumatic.   Pulm: Normal effort  CV: Regular rate and rhythm  Abdomen: Soft, ND, NT. RLQ incision with staples in place and small amount of serosanguineous fluid expressed at medial portion of incision. There is significant ecchymosis over the lower abdomen.   Neuro: Motor strength and sensation grossly intact throughout, no focal deficits  Extremities: No cyanosis or edema  Skin: Warm and dry        Lab/Radiology/Other Diagnostic Tests:  Recent Labs  09/09/18  0744   HGB 7.7*   HCT 22.3*   WBC 6.4   PLTCT 113*   NA 141   K 4.3   CL 110   CO2 21   BUN 60*   CR 2.96*   GLU 125*   CA 9.2   MG 1.9   PO4 3.0   ALBUMIN 3.9   TOTPROT 6.1   TOTBILI 0.8   AST 13   ALT 10   ALKPHOS 75          No orders to display

## 2018-09-12 NOTE — ED Notes
Pt presents to the ED w/ c/o wound check. Pt was D/C'ed for same c/o at 4am. Pt reports the incision began bleeding again prior to leaving the hospital but they went home for dry clothes then came back. Incision is clean/dry/intact at this time. Serosanguinous drainage noted to dressing. Pt denies any pain. Pt is AO x 4. Airway intact, breathing non-labored, pulses palpable. VSS. Bed in low position, side-rails raised, call light in reach.

## 2018-09-12 NOTE — ED Notes
PT VERBALIZED UNDERSTANDING OF FOLLOWUP/DISCHARGE INSTRUCTIONS AND WHEN TO RETURN TO THE ER.  PT WPD, GCS 15, RR EVEN AND UNLABORED, AMBULATORY C  A STEADY GAIT.

## 2018-09-12 NOTE — ED Provider Notes
Michele Benitez is a 58 y.o. female.    Chief Complaint:  No chief complaint on file.      History of Present Illness:  HPI  Patient with kidney transplant 09/01/18, here with drainage from lower central abdominal wound. Seen earlier tonight with same complaint.  No other complaints. She states she bent over and noticed a lot of liquid drain from the lower wound. Staples intact and no fall or injury. No fever, cough, vomiting, diarrhea.    Review of Systems:  Review of Systems   Skin: Positive for wound.   All other systems reviewed and are negative.      Allergies:  Patient has no known allergies.    Past Medical History:  Medical History:   Diagnosis Date   ??? Abnormal stress test 12/03/2015   ??? Dyslipidemia 07/18/2014   ??? ESRD (end stage renal disease) (HCC) 12/03/2015   ??? Hyperlipemia    ??? Hyperphosphatemia 07/18/2014   ??? Hypertension 07/18/2014       Past Surgical History:  Surgical History:   Procedure Laterality Date   ??? Left Heart Catheterization With Ventriculogram Left 12/03/2015    Performed by Harley Alto, MD at [facility] CATH LAB   ??? Coronary Angiography N/A 12/03/2015    Performed by Harley Alto, MD at [facility] CATH LAB   ??? Possible Percutaneous Coronary Intervention N/A 12/03/2015    Performed by Harley Alto, MD at [facility] CATH LAB   ??? ALLOTRANSPLANTATION KIDNEY FROM NON LIVING DONOR WITHOUT RECIPIENT NEPHRECTOMY N/A 09/01/2018    Performed by York Cerise, MD at [facility] OR       Pertinent medical/surgical history reviewed  Medical History:   Diagnosis Date   ??? Abnormal stress test 12/03/2015   ??? Dyslipidemia 07/18/2014   ??? ESRD (end stage renal disease) (HCC) 12/03/2015   ??? Hyperlipemia    ??? Hyperphosphatemia 07/18/2014   ??? Hypertension 07/18/2014     Surgical History:   Procedure Laterality Date   ??? Left Heart Catheterization With Ventriculogram Left 12/03/2015    Performed by Harley Alto, MD at [facility] CATH LAB   ??? Coronary Angiography N/A 12/03/2015    Performed by Harley Alto, [facility] at [facility] CATH LAB ??? Possible Percutaneous Coronary Intervention N/A 12/03/2015    Performed by Harley Alto, MD at [facility] CATH LAB   ??? ALLOTRANSPLANTATION KIDNEY FROM NON LIVING DONOR WITHOUT RECIPIENT NEPHRECTOMY N/A 09/01/2018    Performed by York Cerise, MD at [facility] OR       Social History:  Social History     Tobacco Use   ??? Smoking status: Never Smoker   ??? Smokeless tobacco: Never Used   Substance Use Topics   ??? Alcohol use: No   ??? Drug use: No     Social History     Substance and Sexual Activity   Drug Use No       Family History:  Family History   Problem Relation Age of Onset   ??? Heart Failure Mother    ??? Alcohol abuse Mother    ??? Cancer Father         colon       Vitals:  ED Vitals    None         Physical Exam:  Physical Exam  Vitals signs reviewed.   Constitutional:       General: She is not in acute distress.  Abdominal:      General: Abdomen is flat.  Palpations: Abdomen is soft.      Comments: Lower stapled wound has some serosangenous fluid in paper towel over wound. No active bleeding.   Neurological:      Mental Status: She is alert.         Laboratory Results:  Labs Reviewed - No data to display       Radiology Interpretation:        EKG:        ED Course:  Spoke with transplant team and do not need to see her again.  Maintain same f/u tomorrow in clinic. BP here stable on this admission.           ED Scoring:                             Coding    Facility Administered Meds:  Medications - No data to display      Clinical Impression:  Clinical Impression   None       Disposition/Follow up  ED Disposition     None        No follow-up provider specified.    Medications:  New Prescriptions    No medications on file       Procedure Notes:  Procedures      Attestation / Supervision:  I personally performed the E/M including history, physical exam, and MDM.      Carron Curie, DO

## 2018-09-12 NOTE — ED Notes
PT PRESENTS TO ED 18 C/O DARK RED BLEEDING FROM STAPLES TO RLQ AND WHEN URINATING.  PT DENIES PAIN OR ANY OTHER COMPLAINTS, PT STATES "I JUST WANT TO BE SORTED OUT".   PT  has a past medical history of Abnormal stress test (12/03/2015), Dyslipidemia (07/18/2014), ESRD (end stage renal disease) ([place]) (12/03/2015), Hyperlipemia, Hyperphosphatemia (07/18/2014), and Hypertension (07/18/2014).    BELONGINGS:  SHOES  SOCKS  PANTS  SHIRT

## 2018-09-12 NOTE — ED Notes
PT WANTING TO LEAVE, DR PARK NOTIFIED.  PLAN OF CARE RE-EXPLAINED TO PT.  PT AND SO V/U.

## 2018-09-12 NOTE — ED Provider Notes
Michele Benitez is a 58 y.o. female.    Chief Complaint:  Chief Complaint   Patient presents with   ??? Post-Op Problem     lower abdmonimal incision closed with staples for kidney transplant on 6/11 that started leaking bloody fluid tonight around 2200 while toileting. denies any other complaints        History of Present Illness:  HPI  Patient with kidney transplant 09/01/18, here with drainage from lower central abdominal wound. No other complaints. She states she bent over and noticed a lot of liquid drain from the lower wound. Staples intact and no fall or injury. No fever, cough, vomiting, diarrhea.    Review of Systems:  Review of Systems   Skin: Positive for wound.   All other systems reviewed and are negative.      Allergies:  Patient has no known allergies.    Past Medical History:  Medical History:   Diagnosis Date   ??? Abnormal stress test 12/03/2015   ??? Dyslipidemia 07/18/2014   ??? ESRD (end stage renal disease) (HCC) 12/03/2015   ??? Hyperlipemia    ??? Hyperphosphatemia 07/18/2014   ??? Hypertension 07/18/2014       Past Surgical History:  Surgical History:   Procedure Laterality Date   ??? Left Heart Catheterization With Ventriculogram Left 12/03/2015    Performed by Harley Alto, MD at [facility] CATH LAB   ??? Coronary Angiography N/A 12/03/2015    Performed by Harley Alto, MD at [facility] CATH LAB   ??? Possible Percutaneous Coronary Intervention N/A 12/03/2015    Performed by Harley Alto, MD at [facility] CATH LAB   ??? ALLOTRANSPLANTATION KIDNEY FROM NON LIVING DONOR WITHOUT RECIPIENT NEPHRECTOMY N/A 09/01/2018    Performed by York Cerise, MD at [facility] OR       Pertinent medical/surgical history reviewed  Medical History:   Diagnosis Date   ??? Abnormal stress test 12/03/2015   ??? Dyslipidemia 07/18/2014   ??? ESRD (end stage renal disease) (HCC) 12/03/2015   ??? Hyperlipemia    ??? Hyperphosphatemia 07/18/2014   ??? Hypertension 07/18/2014     Surgical History:   Procedure Laterality Date ??? Left Heart Catheterization With Ventriculogram Left 12/03/2015    Per[facility] by Harley Alto, MD [facility] CATH LAB   ??? Coronary Angiography N/A 12/03/2015    Performed by Harley Alto, MD at [facility] CATH LAB   ??? Possible Percutaneous Coronary Intervention N/A 12/03/2015    [facility] by Harley Alto, [facility] at [facility] CATH LAB   ??? ALLOTRANSPLANTATION KIDNEY FROM NON LIVING DONOR WITHOUT RECIPIENT NEPHRECTOMY N/A 09/01/2018    Performed by York Cerise, MD at [facility] OR       Social History:  Social History     Tobacco Use   ??? Smoking status: Never Smoker   ??? Smokeless tobacco: Never Used   Substance Use Topics   ??? Alcohol use: No   ??? Drug use: No     Social History     Substance and Sexual Activity   Drug Use No       Family History:  Family History   Problem Relation Age of Onset   ??? Heart Failure Mother    ??? Alcohol abuse Mother    ??? Cancer Father         colon       Vitals:  ED Vitals    Date and Time T BP P RR SPO2P SPO2 User   09/11/18 2249 36.7 ???C (98 ???F)  138/65 -- 24 PER MINUTE 72 100 % SF          Physical Exam:  Physical Exam  Vitals signs and nursing note reviewed.   Constitutional:       Appearance: Normal appearance.   HENT:      Head: Normocephalic and atraumatic.   Cardiovascular:      Rate and Rhythm: Regular rhythm.   Pulmonary:      Effort: No respiratory distress.   Abdominal:      General: Abdomen is flat.      Comments: Ecchymosis in areas of wound, staples intact, no active drainage, no purulent fluid, no surrounding erythema, nontender.   Neurological:      Mental Status: She is alert.         Laboratory Results:  Labs Reviewed - No data to display       Radiology Interpretation:      EKG:        ED Course:  Transplant surgery consult appreciated. US done.  Will f/u with Nephrology tomorrow.  All workup in ED appreciated and findings and plan discussed with patient and family.  All questions answered.  Patient given information regarding their current medical condition. Patient will f/u in clinic and instructed to return to the ED if symptoms worsen.   Patient discharged with stable vitals in stable condition.         ED Scoring:                             Coding    Facility Administered Meds:  Medications - No data to display      Clinical Impression:  Clinical Impression   None       Disposition/Follow up  ED Disposition     None        No follow-up provider specified.    Medications:  New Prescriptions    No medications on file       Procedure Notes:  Procedures      Attestation / Supervision:  I personally performed the E/M including history, physical exam, and MDM.      Carron Curie, DO

## 2018-09-12 NOTE — ED Notes
PT AMBULATORY C A STEADY GAIT TO RR FOR URINE SAMPLE

## 2018-09-12 NOTE — ED Notes
DC instructions reviewed w/ pt. Pt acknowledges understanding of DC instructions. VSS. All belongings w/ pt. Pt ambulatory out of ED w/ steady gait.

## 2018-09-13 ENCOUNTER — Encounter: Admit: 2018-09-13 | Discharge: 2018-09-13

## 2018-09-13 ENCOUNTER — Ambulatory Visit: Admit: 2018-09-13 | Discharge: 2018-09-13

## 2018-09-13 DIAGNOSIS — Z940 Kidney transplant status: Secondary | ICD-10-CM

## 2018-09-13 DIAGNOSIS — N186 End stage renal disease: Secondary | ICD-10-CM

## 2018-09-13 DIAGNOSIS — N2889 Other specified disorders of kidney and ureter: Secondary | ICD-10-CM

## 2018-09-13 DIAGNOSIS — D631 Anemia in chronic kidney disease: Secondary | ICD-10-CM

## 2018-09-13 DIAGNOSIS — I151 Hypertension secondary to other renal disorders: Secondary | ICD-10-CM

## 2018-09-13 DIAGNOSIS — D899 Disorder involving the immune mechanism, unspecified: Principal | ICD-10-CM

## 2018-09-13 DIAGNOSIS — E785 Hyperlipidemia, unspecified: Secondary | ICD-10-CM

## 2018-09-13 DIAGNOSIS — Z5181 Encounter for therapeutic drug level monitoring: Secondary | ICD-10-CM

## 2018-09-13 DIAGNOSIS — E139 Other specified diabetes mellitus without complications: Secondary | ICD-10-CM

## 2018-09-13 DIAGNOSIS — R9439 Abnormal result of other cardiovascular function study: Secondary | ICD-10-CM

## 2018-09-13 DIAGNOSIS — E790 Hyperuricemia without signs of inflammatory arthritis and tophaceous disease: Secondary | ICD-10-CM

## 2018-09-13 DIAGNOSIS — I10 Essential (primary) hypertension: Secondary | ICD-10-CM

## 2018-09-13 LAB — PROTEIN/CR RATIO,UR RAN
Lab: 0.5 mg/dL (ref 0.4–1.00)
Lab: 116 mg/dL (ref 7–25)
Lab: 60 mg/dL — ABNORMAL HIGH (ref 70–100)

## 2018-09-13 LAB — PHOSPHORUS: Lab: 4.7 mg/dL — ABNORMAL HIGH (ref 2.0–4.5)

## 2018-09-13 LAB — CBC AND DIFF
Lab: 0 % (ref 0–5)
Lab: 0 10*3/uL (ref 0–0.20)
Lab: 0 10*3/uL (ref 0–0.45)
Lab: 0.3 10*3/uL — ABNORMAL LOW (ref 1.0–4.8)
Lab: 0.7 10*3/uL (ref 0–0.80)
Lab: 1 % (ref 0–2)
Lab: 10 % (ref 4–12)
Lab: 18 % — ABNORMAL HIGH (ref >60)
Lab: 181 10*3/uL (ref 150–400)
Lab: 2.1 M/UL — ABNORMAL LOW (ref 4.0–5.0)
Lab: 22 % — ABNORMAL LOW (ref 36–45)
Lab: 33 g/dL — ABNORMAL LOW (ref >60)
Lab: 35 pg — ABNORMAL HIGH (ref 26–34)
Lab: 4 % — ABNORMAL LOW (ref 24–44)
Lab: 6 10*3/uL (ref 1.8–7.0)
Lab: 7.2 K/UL (ref 4.5–11.0)
Lab: 7.6 g/dL — ABNORMAL LOW (ref 12.0–15.0)
Lab: 8.7 FL (ref 7–11)
Lab: 85 % — ABNORMAL HIGH (ref 41–77)

## 2018-09-13 LAB — COMPREHENSIVE METABOLIC PANEL
Lab: 0.6 mg/dL (ref 0.3–1.2)
Lab: 112 MMOL/L — ABNORMAL HIGH (ref 98–110)
Lab: 139 MMOL/L (ref 137–147)
Lab: 155 mg/dL — ABNORMAL HIGH (ref 70–100)
Lab: 3.4 mg/dL — ABNORMAL HIGH (ref 0.4–1.00)
Lab: 5.2 MMOL/L — ABNORMAL HIGH (ref 3.5–5.1)
Lab: 6 g/dL (ref 6.0–8.0)
Lab: 60 mg/dL — ABNORMAL HIGH (ref 7–25)
Lab: 9.2 mg/dL (ref 8.5–10.6)

## 2018-09-13 LAB — URINALYSIS MICROSCOPIC REFLEX TO CULTURE

## 2018-09-13 LAB — URINALYSIS DIPSTICK REFLEX TO CULTURE
Lab: 6 mL/min (ref >60)
Lab: NEGATIVE
Lab: NEGATIVE
Lab: NEGATIVE

## 2018-09-13 LAB — URIC ACID: Lab: 9.3 mg/dL — ABNORMAL HIGH (ref 2.0–7.0)

## 2018-09-13 LAB — MAGNESIUM: Lab: 1.9 mg/dL (ref 1.6–2.6)

## 2018-09-13 MED ORDER — TACROLIMUS 1 MG PO CAP
3 mg | ORAL_CAPSULE | Freq: Two times a day (BID) | ORAL | 3 refills | 30.00000 days | Status: DC
Start: 2018-09-13 — End: 2018-09-16

## 2018-09-13 NOTE — Patient Instructions
1. Decrease Tacrolimus to 3 mg twice a day ( 12 hrs apart)  2. Do not take tacrolimus before your blood draw

## 2018-09-14 ENCOUNTER — Encounter: Admit: 2018-09-14 | Discharge: 2018-09-14

## 2018-09-14 DIAGNOSIS — Z006 Encounter for examination for normal comparison and control in clinical research program: Secondary | ICD-10-CM

## 2018-09-14 LAB — CULTURE-URINE W/SENSITIVITY: Lab: <10

## 2018-09-14 LAB — TACROLIMUS LC-MS/MS: Lab: 15 FL — ABNORMAL HIGH (ref 80–100)

## 2018-09-15 ENCOUNTER — Encounter: Admit: 2018-09-15 | Discharge: 2018-09-15

## 2018-09-15 ENCOUNTER — Ambulatory Visit: Admit: 2018-09-15 | Discharge: 2018-09-15

## 2018-09-15 DIAGNOSIS — R9439 Abnormal result of other cardiovascular function study: Secondary | ICD-10-CM

## 2018-09-15 DIAGNOSIS — Z006 Encounter for examination for normal comparison and control in clinical research program: Secondary | ICD-10-CM

## 2018-09-15 DIAGNOSIS — Z5181 Encounter for therapeutic drug level monitoring: Secondary | ICD-10-CM

## 2018-09-15 DIAGNOSIS — N186 End stage renal disease: Secondary | ICD-10-CM

## 2018-09-15 DIAGNOSIS — I10 Essential (primary) hypertension: Secondary | ICD-10-CM

## 2018-09-15 DIAGNOSIS — Z940 Kidney transplant status: Secondary | ICD-10-CM

## 2018-09-15 DIAGNOSIS — E785 Hyperlipidemia, unspecified: Secondary | ICD-10-CM

## 2018-09-15 LAB — COMPREHENSIVE METABOLIC PANEL
Lab: 11 U/L (ref 7–40)
Lab: 137 MMOL/L — ABNORMAL LOW (ref 137–147)
Lab: 147 mg/dL — ABNORMAL HIGH (ref 70–100)
Lab: 15 MMOL/L — ABNORMAL LOW (ref 21–30)
Lab: 2.9 mg/dL — ABNORMAL HIGH (ref 0.4–1.00)
Lab: 20 mL/min — ABNORMAL LOW (ref >60)
Lab: 5.5 MMOL/L — ABNORMAL HIGH (ref 3.5–5.1)
Lab: 6.2 g/dL (ref 6.0–8.0)
Lab: 9 U/L (ref 7–56)

## 2018-09-15 LAB — URINALYSIS DIPSTICK REFLEX TO CULTURE
Lab: NEGATIVE
Lab: NEGATIVE
Lab: NEGATIVE
Lab: NEGATIVE

## 2018-09-15 LAB — URINALYSIS MICROSCOPIC REFLEX TO CULTURE

## 2018-09-15 LAB — CBC AND DIFF
Lab: 0 10*3/uL (ref 0–0.20)
Lab: 2.1 M/UL — ABNORMAL LOW (ref 4.0–5.0)
Lab: 6.3 10*3/uL (ref 4.5–11.0)

## 2018-09-15 LAB — PROTEIN/CR RATIO,UR RAN
Lab: 58 mg/dL
Lab: 90 mg/dL

## 2018-09-15 LAB — PHOSPHORUS: Lab: 3.9 mg/dL — ABNORMAL HIGH (ref 2.0–4.5)

## 2018-09-15 LAB — MAGNESIUM: Lab: 1.9 mg/dL — ABNORMAL HIGH (ref 1.6–2.6)

## 2018-09-15 LAB — RESEARCH BLOOD COLLECTION ONLY

## 2018-09-15 LAB — URIC ACID: Lab: 8.8 mg/dL — ABNORMAL HIGH (ref 2.0–7.0)

## 2018-09-15 MED ORDER — DARBEPOETIN ALFA IN POLYSORBAT 40 MCG/0.4 ML IJ SYRG
40 ug | Freq: Once | SUBCUTANEOUS | 0 refills | Status: CP
Start: 2018-09-15 — End: ?
  Administered 2018-09-15: 19:00:00 40 ug via SUBCUTANEOUS

## 2018-09-15 MED ORDER — DARBEPOETIN ALFA IN POLYSORBAT 40 MCG/0.4 ML IJ SYRG
40 ug | Freq: Once | SUBCUTANEOUS | 0 refills | Status: CN
Start: 2018-09-15 — End: ?

## 2018-09-15 NOTE — Patient Instructions
Pt learning assessment complete. Pt education provided on previous clinic visit. Pt denies need for additional education related to diagnosis, test or treatment.

## 2018-09-15 NOTE — Progress Notes
Medications and pillbox reviewed with patient.  Confirmed with patient she did not take Prograf until after labs drawn this am.

## 2018-09-16 ENCOUNTER — Encounter: Admit: 2018-09-16 | Discharge: 2018-09-16

## 2018-09-16 ENCOUNTER — Ambulatory Visit: Admit: 2018-09-15 | Discharge: 2018-09-16

## 2018-09-16 DIAGNOSIS — D631 Anemia in chronic kidney disease: Secondary | ICD-10-CM

## 2018-09-16 LAB — TACROLIMUS LC-MS/MS: Lab: 16

## 2018-09-16 MED ORDER — TACROLIMUS 1 MG PO CAP
2 mg | ORAL_CAPSULE | Freq: Two times a day (BID) | ORAL | 3 refills | 30.00000 days | Status: DC
Start: 2018-09-16 — End: 2018-10-20

## 2018-09-16 NOTE — Progress Notes
Office visit note from 09/16/18 reviewed.  No adjustment to IS.

## 2018-09-16 NOTE — Telephone Encounter
Prograf level reviewed with Dr. Abram Sander.  Level is above goal at 16.0 on 3 mg bid.  Patient asked to decrease dose to 2 mg bid.  Repeated dose adjustment to NC.

## 2018-09-20 ENCOUNTER — Encounter: Admit: 2018-09-20 | Discharge: 2018-09-20

## 2018-09-20 ENCOUNTER — Ambulatory Visit: Admit: 2018-09-20 | Discharge: 2018-09-20

## 2018-09-20 DIAGNOSIS — Z79899 Other long term (current) drug therapy: Secondary | ICD-10-CM

## 2018-09-20 DIAGNOSIS — N186 End stage renal disease: Secondary | ICD-10-CM

## 2018-09-20 DIAGNOSIS — I10 Essential (primary) hypertension: Secondary | ICD-10-CM

## 2018-09-20 DIAGNOSIS — Z5181 Encounter for therapeutic drug level monitoring: Secondary | ICD-10-CM

## 2018-09-20 DIAGNOSIS — E785 Hyperlipidemia, unspecified: Secondary | ICD-10-CM

## 2018-09-20 DIAGNOSIS — D899 Disorder involving the immune mechanism, unspecified: Secondary | ICD-10-CM

## 2018-09-20 DIAGNOSIS — R9439 Abnormal result of other cardiovascular function study: Secondary | ICD-10-CM

## 2018-09-20 DIAGNOSIS — Z940 Kidney transplant status: Principal | ICD-10-CM

## 2018-09-20 LAB — CBC AND DIFF
Lab: 0 10*3/uL (ref 0–0.20)
Lab: 0 10*3/uL (ref 0–0.45)
Lab: 0.3 10*3/uL — ABNORMAL LOW (ref 1.0–4.8)
Lab: 0.4 10*3/uL (ref 0–0.80)
Lab: 1 % (ref 0–2)
Lab: 10 % — ABNORMAL LOW (ref >60)
Lab: 106 FL — ABNORMAL HIGH (ref 80–100)
Lab: 150 10*3/uL (ref 150–400)
Lab: 17 % — ABNORMAL HIGH (ref 11–15)
Lab: 2 % — ABNORMAL LOW (ref >60)
Lab: 2.2 M/UL — ABNORMAL LOW (ref 4.0–5.0)
Lab: 23 % — ABNORMAL LOW (ref 36–45)
Lab: 3.2 10*3/uL (ref 1.8–7.0)
Lab: 34 pg — ABNORMAL HIGH (ref 26–34)
Lab: 4.1 K/UL — ABNORMAL LOW (ref 4.5–11.0)
Lab: 7.8 g/dL — ABNORMAL LOW (ref 12.0–15.0)
Lab: 78 % — ABNORMAL HIGH (ref 41–77)
Lab: 8.4 FL — ABNORMAL LOW (ref 7–11)
Lab: 9 % — ABNORMAL LOW (ref 24–44)

## 2018-09-20 LAB — URINALYSIS DIPSTICK REFLEX TO CULTURE
Lab: 1 (ref 1.003–1.035)
Lab: 5 (ref 5.0–8.0)
Lab: NEGATIVE
Lab: NEGATIVE
Lab: NEGATIVE
Lab: NEGATIVE
Lab: NEGATIVE

## 2018-09-20 LAB — MAGNESIUM: Lab: 1.7 mg/dL (ref 1.6–2.6)

## 2018-09-20 LAB — URINALYSIS MICROSCOPIC REFLEX TO CULTURE

## 2018-09-20 LAB — COMPREHENSIVE METABOLIC PANEL
Lab: 117 MMOL/L — ABNORMAL HIGH (ref 98–110)
Lab: 140 MMOL/L (ref 137–147)
Lab: 5.8 MMOL/L — ABNORMAL HIGH (ref 3.5–5.1)

## 2018-09-20 LAB — PROTEIN/CR RATIO,UR RAN
Lab: 136 mg/dL
Lab: 78 mg/dL

## 2018-09-20 LAB — PHOSPHORUS: Lab: 3.4 mg/dL (ref 2.0–4.5)

## 2018-09-20 LAB — URIC ACID: Lab: 7.3 mg/dL — ABNORMAL HIGH (ref 2.0–7.0)

## 2018-09-20 NOTE — Progress Notes
Transplant Nephrology Clinic Progress Note     Date of Service: 09/20/2018         Michele Benitez is a 58 y.o. 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  Donor: DCD 6th decade of life, Ht 160 cm. Wt 85 kg. COD: Cardiovascular/Anoxia, Creatinine Initial/Terminal. 1/1.24  Right kidney- 2% GS. Mild moderate IF and mild vascular chronicity   KDPI: 75%, cPRA:??? 0%, Historical cPRA 7%  Match: XM -ve, no DSA  CMV: D+/R+  Induction: thymoglobulin 3.8 mg/kg   Maintenance Immunosuppression: Triple drug therapy  Mild injury to renal artery with anastomosis, expect velocities to be high- started on ASA post op.   Post OP: Uncomplicated CIT: 16 Hr???????????? WIT:??? 35 Min  Studies: None  ???      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 The patient has been doing well since seen on 09/13/18. She is accompanied by her spouse.     Home blood pressure have been 140-150s/70s-80s. He remains on Carvedilol.       Her urine output has 1.5L/daily.      Michele Benitez denies hospital admissions or infections since last seen. There are no urinary concerns or GI distress with the current immunosuppression regimen and reports compliance with medications.        Comprehensive 14-point ROS reviewed:    Constitutional/General: Negative for fever, chills, weakness, anorexia, fatigue, malaise or unintentional weight loss/weight gain. As per HPI  HEENT:  Negative for headache, diplopia, tinnitus, rhinorrhea, epistaxis, sore throat, mouth ulcers  Lymphatics: Negative for enlarged lymph nodes  Respiratory: Negative for cough, SOA, DOE  Cardiovascular: Negative for chest pain, SOA, palpitations, orthopnea, PND, swelling Gastrointestinal: Negative for abdominal pain, nausea, vomiting, diarrhea, constipation  Genitourinary: Negative for flank pain, burning micturition, frequency, bloody urine, oliguria  Extremities: Negative for leg swelling  M/S: Negative for joint swelling, redness, pain  Skin: Negative for rash, itching or lesions  Endocrine: Negative for sweating, cold or heat intolerance. No polyuria or polydipsia  Neurologic: Negative for muscle weakness, numbness, tingling.  Hematologic: Negative for anemia, bleeding or bruising  Psychiatric: Negative for thought/mood disorder  Allergic/Immunologic: Negative for hives, pruritus      Pertinent positive and negative systems are noted above and/or in HPI      No Known Allergies        Medical History:   Diagnosis Date   ??? Abnormal stress test 12/03/2015   ??? Dyslipidemia 07/18/2014   ??? ESRD (end stage renal disease) (HCC) 12/03/2015   ??? Hyperlipemia    ??? Hyperphosphatemia 07/18/2014   ??? Hypertension 07/18/2014          Surgical History:   Procedure Laterality Date   ??? Left Heart Catheterization With Ventriculogram Left 12/03/2015    Performed by Harley Alto, MD at [facility] CATH LAB   ??? Coronary Angiography N/A 12/03/2015    Performed by Harley Alto, MD at [facility] CATH LAB   ??? Possible Percutaneous Coronary Intervention N/A 12/03/2015    Performed by Harley Alto, MD at [facility] CATH LAB   ??? ALLOTRANSPLANTATION KIDNEY FROM NON LIVING DONOR WITHOUT RECIPIENT NEPHRECTOMY N/A 09/01/2018    Performed by  York Cerise, MD at [facility] OR          Family History   Problem Relation Age of Onset   ??? Heart Failure Mother    ??? Alcohol abuse Mother    ??? Cancer Father         colon         Social History     Tobacco Use   ??? Smoking status: Never Smoker   ??? Smokeless tobacco: Never Used   Substance Use Topics   ??? Alcohol use: No   ??? Drug use: No   -    Objective:       Physical exam-  Vitals:    09/20/18 1044 09/20/18 1046   BP: (!) 152/74 (!) 148/68   BP Source: Arm, Right Upper Arm, Right Upper Patient Position: Sitting Standing   Pulse: 73 76   Temp: 36.6 ???C (97.9 ???F)    TempSrc: Oral    SpO2: 100%    Weight: 79.8 kg (176 lb)    Height: 167.6 cm (66)    PainSc: Zero    Body mass index is 28.41 kg/m???.        General: NAD, A+Ox4, calm and pleasant. Appears to be stated age.   HENT: Unremarkable, no oral lesions  Neck: Normal ROM, no LAD, no JVD  Lungs: Bilat. CTA  CV: RRR, S1, S2 without carotid bruit or murmur, no edema  Abdomen: Soft, N/D, N/T without hepatosplenomegaly, central obesity, pannus  Incision: RLQ staples   Ext: Left FA fistula with ecchymosis.  M/S: Normal ROM and strength  Neuro: Nonfocal deficits without tremors  Skin: No skin rash  Psyc: Stable        ??? acetaminophen (TYLENOL) 500 mg tablet Take 500 mg by mouth every 6 hours as needed for Pain. Max of 4,000 mg of acetaminophen in 24 hours.   ??? aspirin EC 81 mg tablet Do not take until directed by transplant clinic   ??? carvediloL (COREG) 25 mg tablet Take one tablet by mouth twice daily.   ??? mycophenolate DR (MYFORTIC) 180 mg TbEC tablet Take four tablets by mouth twice daily.   ??? nystatin (MYCOSTATIN) 100,000 units/mL oral suspension Swish and Swallow 5 mL by mouth as directed four times daily.   ??? pantoprazole DR (PROTONIX) 40 mg tablet Take one tablet by mouth daily.   ??? predniSONE (DELTASONE) 5 mg tablet 6/16: take 4 tabs (20 mg) by mouth daily, 6/17: take 3 tabs (15 mg) daily, 6/18: take 2 tabs (10mg ) daily, 6/19 and thereafter: take 1 tab (5mg ) daily   ??? simvastatin (ZOCOR) 20 mg tablet Take 20 mg by mouth at bedtime daily.   ??? tacrolimus (PROGRAF) 1 mg capsule Take two capsules by mouth twice daily.   ??? trimethoprim/sulfamethoxazole (BACTRIM) 80/400 mg tablet Take one tablet by mouth daily.   ??? valGANciclovir (VALCYTE) 450 mg tablet Take one tablet by mouth twice weekly. On Monday and Thursday       Labs and Diagnostic Tests:    Urinalysis:  Lab Results   Component Value Date/Time    UCOLOR YELLOW 09/20/2018 07:29 AM TURBID CLEAR 09/20/2018 07:29 AM    USPGR 1.013 09/20/2018 07:29 AM    UPH 5.0 09/20/2018 07:29 AM    UPROTEIN 1+ (A) 09/20/2018 07:29 AM    UAGLU NEG 09/20/2018 07:29 AM    UKET NEG 09/20/2018 07:29 AM    UBILE NEG 09/20/2018 07:29 AM    UBLD 2+ (A) 09/20/2018 07:29 AM  UROB NORMAL 09/20/2018 07:29 AM       CBC with Diff:  CBC with Diff Latest Ref Rng & Units 09/20/2018 09/15/2018   WBC 4.5 - 11.0 K/UL 4.1(L) 6.3   RBC 4.0 - 5.0 M/UL 2.26(L) 2.19(L)   HGB 12.0 - 15.0 GM/DL 7.8(L) 7.6(L)   HCT 36 - 45 % 23.9(L) 22.7(L)   MCV 80 - 100 FL 106.0(H) 103.4(H)   MCH 26 - 34 PG 34.5(H) 34.8(H)   MCHC 32.0 - 36.0 G/DL 09.6 04.5   RDW 11 - 15 % 17.8(H) 18.2(H)   PLT 150 - 400 K/UL 150 185   MPV 7 - 11 FL 8.4 8.1   NEUT 41 - 77 % 78(H) 83(H)   ANC 1.8 - 7.0 K/UL 3.26 5.23   LYMA 24 - 44 % 9(L) 4(L)   ALYM 1.0 - 4.8 K/UL 0.36(L) 0.26(L)   MONA 4 - 12 % 10 10   AMONO 0 - 0.80 K/UL 0.40 0.63   EOSA 0 - 5 % 2 2   AEOS 0 - 0.45 K/UL 0.06 0.13   BASA 0 - 2 % 1 1   ABAS 0 - 0.20 K/UL 0.05 0.05       CMP:  CMP Latest Ref Rng & Units 09/20/2018 09/15/2018   NA 137 - 147 MMOL/L 140 137   K 3.5 - 5.1 MMOL/L 5.8(H) 5.5(H)   CL 98 - 110 MMOL/L 117(H) 113(H)   CO2 21 - 30 MMOL/L 15(L) 15(L)   GAP 3 - 12 8 9    BUN 7 - 25 MG/DL 40(J) 81(X)   CR 0.4 - 1.00 MG/DL 9.14(N) 8.29(F)   GLUX 70 - 100 MG/DL 621(H) 086(V)   CA 8.5 - 10.6 MG/DL 9.5 9.2   TP 6.0 - 8.0 G/DL 6.5 6.2   ALB 3.5 - 5.0 G/DL 3.8 3.9   ALKP 25 - 784 U/L 85 88   ALT 7 - 56 U/L 8 9   TBILI 0.3 - 1.2 MG/DL 0.5 0.6   GFR >69 mL/min 17(L) 16(L)   GFRAA >60 mL/min 21(L) 20(L)         Other Common Labs:  Other Common Labs Latest Ref Rng & Units 09/20/2018 09/15/2018   IRON 50 - 160 MCG/DL - -   TIBC 629 - 528 MCG/DL - -   PSAT 28 - 42 % - -   FER 10 - 200 NG/ML - -   PO4 2.0 - 4.5 MG/DL 3.4 3.9   MG 1.6 - 2.6 mg/dL 1.7 1.9   UPRO NEG-NEG 1+(A) 1+(A)   UCRR MG/DL 413 90   UPCRC - 0.6 0.6   URICA 2.0 - 7.0 MG/DL 7.3(H) 8.8(H)   HBA1C 4.0 - 6.0 % - -   Tacrolimus 2 - 15 NG/ML - - Imaging:    Last CXR 2 View:  Results for orders placed or performed during the hospital encounter of 09/01/18   CHEST 2 VIEWS    Narrative    CHEST 2 VIEWS     INDICATION: Shortness of breath.    COMPARISON STUDY: Chest radiographs from 08/28/2018.    FINDINGS:    Lungs: The lung volume is normal. No focal airspace disease. Persistent 1.4 cm nodular opacity seen anteriorly on the lateral projection overlying the aortic arch.    Pleura: No pleural effusion or pneumothorax.    Heart and Mediastinum: Stable mild enlargement of the cardiac silhouette.    Skeletal Structures and Soft Tissues: Stable regional skeleton with  convex right midthoracic spine curvature.      Impression    1.  No  consolidation or pleural effusion  2.  Persistent nodule seen anteriorly on the lateral projection, which may represent a calcified nodule. Recommend nonemergent noncontrast CT chest to evaluate for underlying noncalcified pulmonary or mediastinal nodule. This was previously communicated to the clinical team.    By my electronic signature, I attest that I have personally reviewed the images for this examination and formulated the interpretations and opinions expressed in this report       Finalized by Delmar Landau, M.D. on 09/01/2018 11:46 AM. Dictated by Gwinda Passe, MD on 09/01/2018 10:57 AM.           Results for orders placed during the hospital encounter of 09/01/18   US KIDNEY TRANSPLANT    Narrative Ultrasound of transplant kidney    Clinical Indication: Female, 58 year old; post kidney transplant evaluation,     Technique: Multiple grayscale, color Doppler and spectral Doppler ultrasound images were obtained of the renal transplant.    Comparison: Ultrasound September 01, 2018    Findings:    The right lower quadrant renal transplant is normal in size, measuring 11.4 x 5.5 cm.  There is no hydronephrosis.  No peritransplant fluid collections are identified.  Previously noted punctate foci in the renal collecting system are no longer visualized and likely represented gas.    There is persistent subjectively good blood flow to the renal cortex on color Doppler interrogation. Intrarenal resistive indices are low to lower limits of normal, ranging 0.48-0.56, previously 0.44-0.52, with persistent mild systolic acceleration delay.    The main renal artery is patent without visible stenosis. Initial images demonstrate a vessel in the region of the main renal artery which is not well seen on color Doppler evaluation likely represents an adjacent branch vessel. Peak systolic velocity of the main renal artery is 50 cm/s cm/sec (series 1aA), previously 64 cm/s. There is persistent mild systolic acceleration delay in the mid to distal vessel. The transplant renal vein is patent.    The iliac artery and vein are patent with normal direction of blood flow.  Peak systolic velocity of the iliac artery is 63 cm/sec.    The urinary bladder is decompressed by a Foley catheter. Transplant ureteral stent is partially visualized.      Impression 1.  Persistent low resistance blood flow and systolic acceleration delay throughout the transplant kidney and mid to distal transplant renal artery. These findings remain indeterminate and may represent stenosis or other compromise of the main renal artery (such as dissection given reported arterial repair), although, there is no significant velocity elevation to suggest high-grade stenosis. Attention on short-term follow-up in 12 to 24 hours is recommended.  2.  No hydronephrosis.    By my electronic signature, I attest that I have personally reviewed the images for this examination and formulated the interpretations and opinions expressed in this report       Finalized by Jason Coop, M.D. on 09/02/2018 5:00 AM. Dictated by Delfin Gant, M.D. on 09/02/2018 3:41 AM.           Results for orders placed during the hospital encounter of 10/29/15   CT ABD/PELV WO CONTRAST Narrative CT ABDOMEN AND PELVIS    Clinical Indication:  Female, 58 years old. Pretransplant evaluation for end stage renal disease.    Technique: Multiple contiguous axial CT images were obtained through the abdomen and pelvis without IV contrast. Post processing coronal and sagittal  reconstruction images were made from the axial images.    IV contrast: None.  Bowel contrast:  None    Comparison: CT abdomen pelvis November 21, 2013    FINDINGS:    Limited evaluation without the use of IV contrast which includes the viscera and vasculature.    Lower Thorax: Stable left lower lobe scarring. Heart size is normal.    Liver and Biliary system: Upper limits of normal liver size. No biliary ductal dilatation.      Spleen: Unremarkable.    Adrenal Glands and Kidneys: Adrenal glands are unremarkable. Progressive bilateral renal atrophy with areas of renal cortical scarring. There is a probable right lower pole renal cyst. No hydronephrosis is noted.    Pancreas and Retroperitoneum: Pancreas is unremarkable. Decrease in size of prominent retroperitoneal lymph nodes compared to prior exam.    Aorta and Major Vessels: The abdominal aorta is normal caliber with minimal calcified plaque noted. Common and external iliac arteries are normal in caliber with only minimal calcified plaque.    Bowel, Mesentery and Peritoneal space: The small and large bowel loops are normal in caliber. No ascites.    Pelvis: The bladder is decompressed. Uterus is stable in appearance and contains small calcifications, likely fibroids.    Abdominal wall and Osseous Structures: There is a small fat-containing umbilical hernia. Multilevel lumbar disc degeneration, greatest at L4-L5.      Impression 1. Minimal aortoiliac calcific plaque.  2. Progression of bilateral renal atrophy and cortical scarring. No hydronephrosis.         Approved by Marcelline Mates, M.D. on 10/29/2015 2:00 PM    By my electronic signature, I attest that I have personally reviewed the images for this examination and formulated the interpretations and opinions expressed in this report       Finalized by Caro Hight, M.D. on 10/29/2015 4:53 PM. Dictated by Marcelline Mates, M.D. on 10/29/2015 12:59 PM.               Assessment and Plan:    Ms. Diddle with history of ESRD 2/2 HTN s/p a renal transplant for which we are consulted. cPRA 0%, KDPI 75%  ???  #) Immunosuppression - On maintenance Triple drug therapy  - Thymo 150/100/100. Total of 3.8 mg/kg, given thrombocytopenia   - Goal tacrolimus trough first 3 months after transplant is 10-15 ng/mL (MEIA) or 8-12 mcg/L (HPLC/LCMS).  - Keep Prograf 2/2   - MPA 720mg  BID and steroid pulse to wean to prednisone 5 mg daily.  ???  #) Deceased donor renal transplant: bl Cr tbd mg/dL,   - Renal graft function: excellent with gradual improvement in scr , currently 2.87 mg/dl, BUN 32  - Will hold aspirin until resolution of thrombocytopenia [150]     #) ID Proph  - CMV Donor(+)/ Recipient(+) to treat with Valcyte for 3 months post transplant  - PJP Prophylax: To treat with Bactrim for 1 year post transplant  - Nystatin swish for 2 weeks post transplant    #) Incisional Wound- RLQ healing with staples. Keep staples for 3 weeks post transplant  ???  #) HTN: improving-plan to add nifedipine or ACE-I.   ???  #) Glycemia - BGs have been trending down on labs but still elevated  ???  #) Anemia - Hgb today is 7.8  - Iron stores well repleted [09/03/18]  - Arrange for Aranesp last week] and has one today, may need to increase dose if no improvement  - denies hematuria, hemoptysis  or melena  ???  #) Volume/Electrolytes:   Stable K+, Mg, PO4      RTC next week  Update labs twice weekly    Martyn Ehrich, APRN  Transplant Nephrology Nurse Practitioner  [facility]  [phone]  [phone]        Cc: [name]  Cc: Rockwell [place]                      Please contact the [facility] at  [phone] for any transplant related questions or concerns that may arise.

## 2018-09-20 NOTE — Patient Instructions
Please make your return appointment for one week  Please repeat your labs twice weekly  It was a pleasure meeting with you today, if you should have any questions or concerns (including about your medication list) please contact the Kidney Transplant Clinic at [phone].     For up to date information on the COVID-19 virus, visit the [facility] [place] [facility] website. [email or web address]   General supportive care during cold and flu season and infection prevention reminders:    o Wash hands often with soap and water for at least 20 seconds   o Cover your mouth and nose   o Social distancing: try to maintain 6 feet between you and other people   o Stay home if sick and symptoms mild or manageable?   If you must be around people wear a mask     If you are having symptoms of a lower respiratory infection (cough, shortness of breath) and/or fever AND either traveled in last 30 days (internationally or to region of exposure) OR known exposure to patient with COVID19:     o Call your primary care provider for questions or health needs.    Tell your doctor about your recent travel and your symptoms     o In a medical emergency, call 911 or go to the nearest emergency room.

## 2018-09-20 NOTE — Progress Notes
Office visit note from 09/20/18 reviewed.  Continue to hold Aspirin.  No further changes to POC.

## 2018-09-21 LAB — CULTURE-URINE W/SENSITIVITY: Lab: <10

## 2018-09-21 LAB — TACROLIMUS LC-MS/MS: Lab: 11 g/dL (ref 32.0–36.0)

## 2018-09-22 ENCOUNTER — Ambulatory Visit: Admit: 2018-09-22 | Discharge: 2018-09-23

## 2018-09-22 ENCOUNTER — Ambulatory Visit: Admit: 2018-09-22 | Discharge: 2018-09-22

## 2018-09-22 ENCOUNTER — Encounter: Admit: 2018-09-22 | Discharge: 2018-09-22

## 2018-09-22 DIAGNOSIS — E785 Hyperlipidemia, unspecified: Secondary | ICD-10-CM

## 2018-09-22 DIAGNOSIS — N186 End stage renal disease: Principal | ICD-10-CM

## 2018-09-22 DIAGNOSIS — D631 Anemia in chronic kidney disease: Secondary | ICD-10-CM

## 2018-09-22 DIAGNOSIS — R9439 Abnormal result of other cardiovascular function study: Secondary | ICD-10-CM

## 2018-09-22 DIAGNOSIS — Z940 Kidney transplant status: Secondary | ICD-10-CM

## 2018-09-22 DIAGNOSIS — Z5181 Encounter for therapeutic drug level monitoring: Secondary | ICD-10-CM

## 2018-09-22 DIAGNOSIS — I10 Essential (primary) hypertension: Secondary | ICD-10-CM

## 2018-09-22 LAB — URINALYSIS MICROSCOPIC REFLEX TO CULTURE

## 2018-09-22 LAB — MAGNESIUM: Lab: 1.7 mg/dL (ref 1.6–2.6)

## 2018-09-22 LAB — COMPREHENSIVE METABOLIC PANEL
Lab: 115 MMOL/L — ABNORMAL HIGH (ref >60)
Lab: 138 MMOL/L (ref 137–147)
Lab: 156 mg/dL — ABNORMAL HIGH (ref 70–100)
Lab: 2.9 mg/dL — ABNORMAL HIGH (ref 0.4–1.00)
Lab: 29 mg/dL — ABNORMAL HIGH (ref 7–25)
Lab: 5.2 MMOL/L — ABNORMAL HIGH (ref >60)

## 2018-09-22 LAB — CBC AND DIFF
Lab: 104 FL — ABNORMAL HIGH (ref 80–100)
Lab: 144 K/UL — ABNORMAL LOW (ref 150–400)
Lab: 17 % — ABNORMAL HIGH (ref 11–15)
Lab: 2.2 M/UL — ABNORMAL LOW (ref 4.0–5.0)
Lab: 23 % — ABNORMAL LOW (ref 36–45)
Lab: 33 g/dL — ABNORMAL LOW (ref 32.0–36.0)
Lab: 34 pg — ABNORMAL HIGH (ref 26–34)
Lab: 4.2 10*3/uL — ABNORMAL LOW (ref 4.5–11.0)
Lab: 7.8 g/dL — ABNORMAL LOW (ref 12.0–15.0)
Lab: 8 % — ABNORMAL LOW (ref 24–44)
Lab: 8.2 FL — ABNORMAL LOW (ref >60)
Lab: 80 % — ABNORMAL HIGH (ref >60)

## 2018-09-22 LAB — URINALYSIS DIPSTICK REFLEX TO CULTURE
Lab: 1 (ref 1.003–1.035)
Lab: 5 (ref 5.0–8.0)

## 2018-09-22 LAB — PROTEIN/CR RATIO,UR RAN
Lab: 0.6 10*3/uL (ref 0–0.20)
Lab: 159 mg/dL (ref 0–0.45)
Lab: 89 mg/dL (ref 0–0.80)

## 2018-09-22 LAB — URIC ACID: Lab: 7.4 mg/dL — ABNORMAL HIGH (ref 2.0–7.0)

## 2018-09-22 LAB — PHOSPHORUS: Lab: 3.6 mg/dL (ref 2.0–4.5)

## 2018-09-22 MED ORDER — DARBEPOETIN ALFA IN POLYSORBAT 40 MCG/0.4 ML IJ SYRG
40 ug | Freq: Once | SUBCUTANEOUS | 0 refills | Status: CP
Start: 2018-09-22 — End: ?
  Administered 2018-09-22: 20:00:00 40 ug via SUBCUTANEOUS

## 2018-09-22 MED ORDER — PREDNISONE 5 MG PO TAB
5 mg | ORAL_TABLET | Freq: Every day | ORAL | 3 refills | Status: DC
Start: 2018-09-22 — End: 2019-07-27
  Filled 2018-09-22: qty 90, 30d supply, fill #1

## 2018-09-22 MED ORDER — DARBEPOETIN ALFA IN POLYSORBAT 40 MCG/0.4 ML IJ SYRG
40 ug | Freq: Once | SUBCUTANEOUS | 0 refills | Status: CN
Start: 2018-09-22 — End: ?

## 2018-09-22 NOTE — Progress Notes
Patient received aranesp injection, Hgb drawn this morning 7.8.  Patient tolerated injection well.

## 2018-09-23 LAB — TACROLIMUS LC-MS/MS: Lab: 10 pg (ref 26–34)

## 2018-09-23 LAB — CULTURE-URINE W/SENSITIVITY: Lab: <10

## 2018-09-27 ENCOUNTER — Encounter: Admit: 2018-09-27 | Discharge: 2018-09-27

## 2018-09-27 ENCOUNTER — Ambulatory Visit: Admit: 2018-09-27 | Discharge: 2018-09-27

## 2018-09-27 ENCOUNTER — Ambulatory Visit: Admit: 2018-09-27 | Discharge: 2018-09-28

## 2018-09-27 DIAGNOSIS — T8610 Unspecified complication of kidney transplant: Secondary | ICD-10-CM

## 2018-09-27 DIAGNOSIS — Z5181 Encounter for therapeutic drug level monitoring: Secondary | ICD-10-CM

## 2018-09-27 DIAGNOSIS — N186 End stage renal disease: Secondary | ICD-10-CM

## 2018-09-27 DIAGNOSIS — D631 Anemia in chronic kidney disease: Secondary | ICD-10-CM

## 2018-09-27 DIAGNOSIS — D899 Disorder involving the immune mechanism, unspecified: Secondary | ICD-10-CM

## 2018-09-27 DIAGNOSIS — I151 Hypertension secondary to other renal disorders: Secondary | ICD-10-CM

## 2018-09-27 DIAGNOSIS — Z006 Encounter for examination for normal comparison and control in clinical research program: Secondary | ICD-10-CM

## 2018-09-27 DIAGNOSIS — R9439 Abnormal result of other cardiovascular function study: Secondary | ICD-10-CM

## 2018-09-27 DIAGNOSIS — E785 Hyperlipidemia, unspecified: Secondary | ICD-10-CM

## 2018-09-27 DIAGNOSIS — N2889 Other specified disorders of kidney and ureter: Secondary | ICD-10-CM

## 2018-09-27 DIAGNOSIS — Z940 Kidney transplant status: Principal | ICD-10-CM

## 2018-09-27 DIAGNOSIS — I10 Essential (primary) hypertension: Secondary | ICD-10-CM

## 2018-09-27 LAB — CBC AND DIFF
Lab: 0 10*3/uL (ref 0–0.20)
Lab: 0 10*3/uL (ref 0–0.45)
Lab: 0.3 10*3/uL (ref 0–0.80)
Lab: 0.4 10*3/uL — ABNORMAL LOW (ref 1.0–4.8)
Lab: 1 % (ref 0–2)
Lab: 1 % (ref 0–5)
Lab: 11 % — ABNORMAL LOW (ref >60)
Lab: 130 K/UL — ABNORMAL LOW (ref 150–400)
Lab: 16 % — ABNORMAL HIGH (ref 11–15)
Lab: 2.3 M/UL — ABNORMAL LOW (ref 4.0–5.0)
Lab: 23 % — ABNORMAL LOW (ref 36–45)
Lab: 3.2 10*3/uL (ref 1.8–7.0)
Lab: 33 g/dL (ref 32.0–36.0)
Lab: 4.1 K/UL — ABNORMAL LOW (ref 4.5–11.0)
Lab: 7.6 FL — ABNORMAL LOW (ref 7–11)
Lab: 79 % — ABNORMAL HIGH (ref 41–77)

## 2018-09-27 LAB — COMPREHENSIVE METABOLIC PANEL
Lab: 114 MMOL/L — ABNORMAL HIGH (ref >60)
Lab: 124 mg/dL — ABNORMAL HIGH (ref 70–100)
Lab: 136 MMOL/L — ABNORMAL LOW (ref 137–147)
Lab: 20 mL/min — ABNORMAL LOW (ref >60)
Lab: 4.5 MMOL/L — ABNORMAL LOW (ref >60)
Lab: 9.5 mg/dL — ABNORMAL LOW (ref 8.5–10.6)

## 2018-09-27 LAB — URINALYSIS DIPSTICK REFLEX TO CULTURE
Lab: 5 K/UL (ref 5.0–8.0)
Lab: NEGATIVE mg/dL — ABNORMAL LOW (ref 0–0.80)
Lab: NEGATIVE
Lab: NEGATIVE

## 2018-09-27 LAB — URIC ACID: Lab: 8.1 mg/dL — ABNORMAL HIGH (ref 2.0–7.0)

## 2018-09-27 LAB — URINALYSIS MICROSCOPIC REFLEX TO CULTURE

## 2018-09-27 LAB — PROTEIN/CR RATIO,UR RAN: Lab: 0.4 mL/min (ref 0–5)

## 2018-09-27 MED ORDER — NIFEDIPINE 30 MG PO TR24
30 mg | ORAL_TABLET | Freq: Every day | ORAL | 11 refills | 30.00000 days | Status: DC
Start: 2018-09-27 — End: 2018-11-15
  Filled 2018-09-27: qty 30, 30d supply, fill #1

## 2018-09-27 MED ORDER — SODIUM BICARBONATE 650 MG PO TAB
650 mg | Freq: Three times a day (TID) | ORAL | 0 refills | 30.00000 days | Status: DC
Start: 2018-09-27 — End: 2018-10-07

## 2018-09-27 MED FILL — PANTOPRAZOLE 40 MG PO TBEC: 40 mg | ORAL | 30 days supply | Qty: 30 | Fill #2 | Status: CP

## 2018-09-27 MED FILL — SULFAMETHOXAZOLE-TRIMETHOPRIM 400-80 MG PO TAB: 80/400 mg | ORAL | 30 days supply | Qty: 30 | Fill #2 | Status: CP

## 2018-09-27 NOTE — Progress Notes
Pt seen in transplant clinic today by Dr Tillie Rung removed, steri strips applied, wound edges approximated.  Wound care education provided to pt and husband  Pt with small amount serrosanguiness drainage from upper incision. Drainage from staple insertions.   Renal transplant ultrasound results called to Dr Abram Sander and Dr Juleen Starr.  Report pending  Plan for CO2 angiogram    Med changes:  Started procardia xl 30 mg q day.  E scribed script  Started sodium bicarbonate 650 mg tid.  Pt has tablets at home  Increased protonix to bid for 1 week    RTC 1 week with labs twice a week

## 2018-09-28 NOTE — Progress Notes
Transplant Nephrology Clinic Progress Note     Date of Service: 09/27/2018    Michele Benitez is a 58 y.o. 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  Donor: DCD 6th decade of life; end to side anastomosis  KDPI: 75%,   cPRA:??? 0%/7%  DSA: none  CMV: D+/R+  Induction: thymoglobulin 3.8 mg/kg   Maintenance Immunosuppression: Triple drug therapy  Mild injury to renal artery with anastomosis, expect velocities to be high- started on ASA post op.   Post OP: Uncomplicated CIT: 16 Hr, WIT:??? 35 Min  Studies: None  Stent Removal Date: TBD   ???      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 The patient has been doing well since seen on 09/20/18. She is accompanied by her Husband.   She feels well and has no new complaints except for few episodes of nausea after taking her meds    Home blood pressure have been 130-150s/70s-80s. She remains on Carvedilol.   Her uop is improving slowly ~ 800 ml/day   She denies any fevers or chills, no chest pain or SOB  No Diarrhea, wound  Pain is minimal   Michele Benitez denies hospital admissions or infections since last seen. There are no urinary concerns or GI distress with the current immunosuppression regimen and reports compliance with medications.    Comprehensive 14-point ROS reviewed:    Constitutional/General: Negative for fever, chills, weakness, anorexia, fatigue, malaise or unintentional weight loss/weight gain. As per HPI  HEENT:  Negative for headache, diplopia, tinnitus, rhinorrhea, epistaxis, sore throat, mouth ulcers  Lymphatics: Negative for enlarged lymph nodes  Respiratory: Negative for cough, SOA, DOE  Cardiovascular: Negative for chest pain, SOA, palpitations, orthopnea, PND, swelling Gastrointestinal: + brief episodes of nausea/vomiting, but no diarrhea, or constipation  Genitourinary: Negative for flank pain, burning micturition, frequency, bloody urine, oliguria  Extremities: Negative for leg swelling  M/S: Negative for joint swelling, redness, pain  Skin: Negative for rash, itching or lesions  Endocrine: Negative for sweating, cold or heat intolerance. No polyuria or polydipsia  Neurologic: Negative for muscle weakness, numbness, tingling.  Hematologic: Negative for anemia, bleeding or bruising  Psychiatric: Negative for thought/mood disorder  Allergic/Immunologic: Negative for hives, pruritus    Pertinent positive and negative systems are noted above and/or in HPI      Current Outpatient Medications:   ???  acetaminophen (TYLENOL) 500 mg tablet, Take 500 mg by mouth every 6 hours as needed for Pain. Max of 4,000 mg of acetaminophen in 24 hours., Disp: , Rfl:   ???  aspirin EC 81 mg tablet, Do not take until directed by transplant clinic, Disp: , Rfl:   ???  carvediloL (COREG) 25 mg tablet, Take one tablet by mouth twice daily., Disp: 60 tablet, Rfl: 6  ???  mycophenolate DR (MYFORTIC) 180 mg TbEC tablet, Take four tablets by mouth twice daily., Disp: 240 tablet, Rfl: 11  ???  NIFEdipine XL (PROCARDIA-XL) 30 mg tablet, Take one tablet by mouth daily., Disp: 30 tablet, Rfl: 11  ???  nystatin (MYCOSTATIN) 100,000 units/mL oral suspension, Swish and Swallow 5 mL by mouth as directed four times daily., Disp: 200 mL,  Rfl: 0  ???  pantoprazole DR (PROTONIX) 40 mg tablet, Take one tablet by mouth daily., Disp: 30 tablet, Rfl: 11  ???  predniSONE (DELTASONE) 5 mg tablet, Take one tablet by mouth daily with breakfast., Disp: 90 tablet, Rfl: 3  ???  simvastatin (ZOCOR) 20 mg tablet, Take 20 mg by mouth at bedtime daily., Disp: , Rfl:   ???  sodium bicarbonate 650 mg tablet, Take one tablet by mouth three times daily., Disp: , Rfl:   ???  tacrolimus (PROGRAF) 1 mg capsule, Take two capsules by mouth twice daily., Disp: 360 capsule, Rfl: 3  ???  trimethoprim/sulfamethoxazole (BACTRIM) 80/400 mg tablet, Take one tablet by mouth daily., Disp: 30 tablet, Rfl: 11  ???  valGANciclovir (VALCYTE) 450 mg tablet, Take one tablet by mouth twice weekly. On Monday and Thursday, Disp: 60 tablet, Rfl: 2    No Known Allergies      Medical History:   Diagnosis Date   ??? Abnormal stress test 12/03/2015   ??? Dyslipidemia 07/18/2014   ??? ESRD (end stage renal disease) (HCC) 12/03/2015   ??? Hyperlipemia    ??? Hyperphosphatemia 07/18/2014   ??? Hypertension 07/18/2014          Surgical History:   Procedure Laterality Date   ??? Left Heart Catheterization With Ventriculogram Left 12/03/2015    Performed by Harley Alto, MD at [facility] CATH LAB   ??? Coronary Angiography N/A 12/03/2015    Performed by Harley Alto, MD at [facility] CATH LAB   ??? Possible Percutaneous Coronary Intervention N/A 12/03/2015    Performed by Harley Alto[facility] at [facility] CATH LAB   ??? ALLOTRANSPLANTATION KIDNEY FROM NON LIVING DONOR WITHOUT RECIPIENT NEPHRECTOMY N/A 09/01/2018    Performed by York Cerise, MD at [facility] OR          Family History   Problem Relation Age of Onset   ??? Heart Failure Mother    ??? Alcohol abuse Mother    ??? Cancer Father         colon         Social History     Tobacco Use   ??? Smoking status: Never Smoker   ??? Smokeless tobacco: Never Used   Substance Use Topics   ??? Alcohol use: No   ??? Drug use: No   -    Objective:       Physical exam-  Vitals:    09/27/18 1009 09/27/18 1013   BP: (!) 161/72 (!) 148/73   BP Source: Arm, Right Upper Arm, Right Upper   Patient Position: Sitting Standing   Pulse: 94 78   Temp: 36.5 ???C (97.7 ???F)    TempSrc: Oral    SpO2: 96%    Weight: 78.7 kg (173 lb 9.6 oz)    Height: 167.6 cm (66)    PainSc: Three    Body mass index is 28.02 kg/m???.    General: NAD, A+Ox4, calm and pleasant. Appears to be stated age.   HENT: Unremarkable, no oral lesions  Neck: Normal ROM, no LAD, no JVD  Lungs: Bilat. CTA  CV: RRR, S1, S2 without carotid bruit or murmur, no edema Abdomen: Soft, N/D, N/T without hepatosplenomegaly, central obesity, pannus  Incision: RLQ staples   Ext: Left FA fistula, no edema   M/S: Normal ROM and strength  Neuro: Nonfocal deficits without tremors  Skin: No skin rash  Psyc: Stable        Labs and Diagnostic Tests:    Urinalysis:  Lab Results   Component Value Date/Time    UCOLOR YELLOW 09/27/2018 09:36 AM    TURBID CLEAR 09/27/2018 09:36 AM    USPGR 1.015 09/27/2018 09:36 AM    UPH 5.0 09/27/2018 09:36 AM    UPROTEIN 1+ (A) 09/27/2018 09:36 AM    UAGLU NEG 09/27/2018 09:36 AM    UKET NEG 09/27/2018 09:36 AM    UBILE NEG 09/27/2018 09:36 AM    UBLD 2+ (A) 09/27/2018 09:36 AM    UROB NORMAL 09/27/2018 09:36 AM       CBC with Diff:  CBC with Diff Latest Ref Rng & Units 09/27/2018 09/22/2018   WBC 4.5 - 11.0 K/UL 4.1(L) 4.2(L)   RBC 4.0 - 5.0 M/UL 2.31(L) 2.25(L)   HGB 12.0 - 15.0 GM/DL 8.0(L) 7.8(L)   HCT 36 - 45 % 23.9(L) 23.5(L)   MCV 80 - 100 FL 103.5(H) 104.7(H)   MCH 26 - 34 PG 34.5(H) 34.5(H)   MCHC 32.0 - 36.0 G/DL 54.0 98.1   RDW 11 - 15 % 16.9(H) 17.8(H)   PLT 150 - 400 K/UL 130(L) 144(L)   MPV 7 - 11 FL 7.6 8.2   NEUT 41 - 77 % 79(H) 80(H)   ANC 1.8 - 7.0 K/UL 3.23 3.37   LYMA 24 - 44 % 11(L) 8(L)   ALYM 1.0 - 4.8 K/UL 0.43(L) 0.33(L)   MONA 4 - 12 % 8 8   AMONO 0 - 0.80 K/UL 0.33 0.33   EOSA 0 - 5 % 1 3   AEOS 0 - 0.45 K/UL 0.04 0.12   BASA 0 - 2 % 1 1   ABAS 0 - 0.20 K/UL 0.06 0.04       CMP:  CMP Latest Ref Rng & Units 09/27/2018 09/22/2018   NA 137 - 147 MMOL/L 136(L) 138   K 3.5 - 5.1 MMOL/L 4.5 5.2(H)   CL 98 - 110 MMOL/L 114(H) 115(H)   CO2 21 - 30 MMOL/L 15(L) 15(L)   GAP 3 - 12 7 8    BUN 7 - 25 MG/DL 19(J) 47(W)   CR 0.4 - 1.00 MG/DL 2.95(A) 2.13(Y)   GLUX 70 - 100 MG/DL 865(H) 846(N)   CA 8.5 - 10.6 MG/DL 9.5 9.4   TP 6.0 - 8.0 G/DL 6.4 6.4   ALB 3.5 - 5.0 G/DL 4.0 3.8   ALKP 25 - 629 U/L 89 90   ALT 7 - 56 U/L 6(L) 8   TBILI 0.3 - 1.2 MG/DL 0.4 0.4   GFR >52 mL/min 16(L) 16(L)   GFRAA >60 mL/min 20(L) 20(L)         Other Common Labs: Other Common Labs Latest Ref Rng & Units 09/27/2018 09/22/2018   IRON 50 - 160 MCG/DL - -   TIBC 841 - 324 MCG/DL - -   PSAT 28 - 42 % - -   FER 10 - 200 NG/ML - -   PO4 2.0 - 4.5 MG/DL 3.4 3.6   MG 1.6 - 2.6 mg/dL 4.0(N) 1.7   UPRO NEG-NEG 1+(A) 1+(A)   UCRR MG/DL 027 253   UPCRC - 0.4 0.6   URICA 2.0 - 7.0 MG/DL 6.6(Y) 7.4(H)   HBA1C 4.0 - 6.0 % - -   Tacrolimus 2 - 15 NG/ML - -       Imaging:    Last CXR 2 View:  Results for orders placed or performed during the hospital encounter of 09/01/18   CHEST 2 VIEWS    Narrative  CHEST 2 VIEWS     INDICATION: Shortness of breath.    COMPARISON STUDY: Chest radiographs from 08/28/2018.    FINDINGS:    Lungs: The lung volume is normal. No focal airspace disease. Persistent 1.4 cm nodular opacity seen anteriorly on the lateral projection overlying the aortic arch.    Pleura: No pleural effusion or pneumothorax.    Heart and Mediastinum: Stable mild enlargement of the cardiac silhouette.    Skeletal Structures and Soft Tissues: Stable regional skeleton with convex right midthoracic spine curvature.      Impression    1.  No  consolidation or pleural effusion  2.  Persistent nodule seen anteriorly on the lateral projection, which may represent a calcified nodule. Recommend nonemergent noncontrast CT chest to evaluate for underlying noncalcified pulmonary or mediastinal nodule. This was previously communicated to the clinical team.    By my electronic signature, I attest that I have personally reviewed the images for this examination and formulated the interpretations and opinions expressed in this report       Finalized by Delmar Landau, M.D. on 09/01/2018 11:46 AM. Dictated by Gwinda Passe, MD on 09/01/2018 10:57 AM.           Results for orders placed during the hospital encounter of 09/01/18   US KIDNEY TRANSPLANT    Narrative Ultrasound of transplant kidney    Clinical Indication: Female, 58 year old; post kidney transplant evaluation, Technique: Multiple grayscale, color Doppler and spectral Doppler ultrasound images were obtained of the renal transplant.    Comparison: Ultrasound September 01, 2018    Findings:    The right lower quadrant renal transplant is normal in size, measuring 11.4 x 5.5 cm.  There is no hydronephrosis.  No peritransplant fluid collections are identified.  Previously noted punctate foci in the renal collecting system are no longer visualized and likely represented gas.    There is persistent subjectively good blood flow to the renal cortex on color Doppler interrogation. Intrarenal resistive indices are low to lower limits of normal, ranging 0.48-0.56, previously 0.44-0.52, with persistent mild systolic acceleration delay.    The main renal artery is patent without visible stenosis. Initial images demonstrate a vessel in the region of the main renal artery which is not well seen on color Doppler evaluation likely represents an adjacent branch vessel. Peak systolic velocity of the main renal artery is 50 cm/s cm/sec (series 1aA), previously 64 cm/s. There is persistent mild systolic acceleration delay in the mid to distal vessel. The transplant renal vein is patent.    The iliac artery and vein are patent with normal direction of blood flow.  Peak systolic velocity of the iliac artery is 63 cm/sec.    The urinary bladder is decompressed by a Foley catheter. Transplant ureteral stent is partially visualized.      Impression 1.  Persistent low resistance blood flow and systolic acceleration delay throughout the transplant kidney and mid to distal transplant renal artery. These findings remain indeterminate and may represent stenosis or other compromise of the main renal artery (such as dissection given reported arterial repair), although, there is no significant velocity elevation to suggest high-grade stenosis. Attention on short-term follow-up in 12 to 24 hours is recommended.  2.  No hydronephrosis. By my electronic signature, I attest that I have personally reviewed the images for this examination and formulated the interpretations and opinions expressed in this report       Finalized by Jason Coop, M.D. on 09/02/2018 5:00 AM. Dictated by  Delfin Gant, M.D. on 09/02/2018 3:41 AM.           Results for orders placed during the hospital encounter of 10/29/15   CT ABD/PELV WO CONTRAST    Narrative CT ABDOMEN AND PELVIS    Clinical Indication:  Female, 58 years old. Pretransplant evaluation for end stage renal disease.    Technique: Multiple contiguous axial CT images were obtained through the abdomen and pelvis without IV contrast. Post processing coronal and sagittal reconstruction images were made from the axial images.    IV contrast: None.  Bowel contrast:  None    Comparison: CT abdomen pelvis November 21, 2013    FINDINGS:    Limited evaluation without the use of IV contrast which includes the viscera and vasculature.    Lower Thorax: Stable left lower lobe scarring. Heart size is normal.    Liver and Biliary system: Upper limits of normal liver size. No biliary ductal dilatation.      Spleen: Unremarkable.    Adrenal Glands and Kidneys: Adrenal glands are unremarkable. Progressive bilateral renal atrophy with areas of renal cortical scarring. There is a probable right lower pole renal cyst. No hydronephrosis is noted.    Pancreas and Retroperitoneum: Pancreas is unremarkable. Decrease in size of prominent retroperitoneal lymph nodes compared to prior exam.    Aorta and Major Vessels: The abdominal aorta is normal caliber with minimal calcified plaque noted. Common and external iliac arteries are normal in caliber with only minimal calcified plaque.    Bowel, Mesentery and Peritoneal space: The small and large bowel loops are normal in caliber. No ascites.    Pelvis: The bladder is decompressed. Uterus is stable in appearance and contains small calcifications, likely fibroids. Abdominal wall and Osseous Structures: There is a small fat-containing umbilical hernia. Multilevel lumbar disc degeneration, greatest at L4-L5.      Impression 1. Minimal aortoiliac calcific plaque.  2. Progression of bilateral renal atrophy and cortical scarring. No hydronephrosis.         Approved by Marcelline Mates, M.D. on 10/29/2015 2:00 PM    By my electronic signature, I attest that I have personally reviewed the images for this examination and formulated the interpretations and opinions expressed in this report       Finalized by Caro Hight, M.D. on 10/29/2015 4:53 PM. Dictated by Marcelline Mates, M.D. on 10/29/2015 12:59 PM.       Renal USN (today):    Right lower quadrant renal graft: Measures 11.1 cm, previously 11.2 cm.   Punctate nonobstructing calculi are again noted. No hydronephrosis.  * ???Increased size of a liquefying, multiloculated postoperative fluid   collection in the iliac fossa and extending into the   extraperitoneal/preperitoneal space, predominantly anterior and lateral to   the graft. This does not lie in close proximity to the renal hilum or   vessels.   * ???There has also been slight increase in size of a subincisional fluid   collection in the abdominal wall, which now measures 14.8 cm craniocaudal   by 5.3 cm in thickness (previously 13.4 cm x 3.6 cm).    Intraparenchymal flow: Intrarenal resistive indices are upper limits of   normal, ranging 0.63-0.76, previously 0.70-0.76. The renal artery and   transplant kidney does not appear well-perfused and/or evaluated on color   Doppler imaging. Delayed systolic upstroke throughout the intraparenchymal   Dopplers.    Main Renal Artery: Patent, with some mild proximal tortuosity, though   incompletely evaluated due to poor color  filling on color Doppler.PSV   ranges from 43-58 cm/s, previously ranging from 29-194 cm/s    Main Renal Vein: Patent, no visible stenosis.    External Iliac Artery: Patent, no visible stenosis. PSV 96 cm/s, previously 81 cm/s. ???  External Iliac Vein: Patent, no visible stenosis.    Urinary Bladder: The visualized urinary bladder is unremarkable.  Impression:    1. ???Persistent systolic acceleration delay throughout the renal cortex.   These findings remain suspicious for vascular compromise to the renal   graft, possibly due to stenosis or vessel kinking near the anastomosis   (history of arterial injury of the donor artery). Recommend conventional   angiography for further evaluation and possible intervention.    2. ???Increase in size and conspicuity of organized postoperative   subincisional and extraperitoneal fluid collections, which are again   favored for liquefying hematomas and/or lymphoceles. These do not lie   adjacent to the renal vessels, or appear to directly compromise renal   blood flow.  3. ???Punctate nonobstructing calculi within the transplant kidney again   noted. No hydronephrosis.       Assessment and Plan:    Michele Benitez with history of ESRD 2/2 HTN s/p a renal transplant on 09/01/18.  ???  #) Immunosuppression - On maintenance Triple drug therapy  - Goal tacrolimus trough first 3 months after transplant is 10-15 ng/mL (MEIA) or 8-12 mcg/L (HPLC/LCMS).  - Prograf 2 mg bid, Tacrolimus level is pending   - MPA 720mg  BID and prednisone 5 mg daily.  ???  #) Deceased donor renal transplant:   - Scr unchanged  - Will Obtain RUS today (see above); will need CO2 angiogram in the future  - Will consider sending Allo-sure/DSA and maybe kidney biopsy  - Minimal proteinuria  - Will hold aspirin until resolution of thrombocytopenia [150       #) ID Proph  - CMV Donor(+)/ Recipient(+) to treat with Valcyte for 3 months post transplant  - PJP Prophylax: To treat with Bactrim for 1 year post transplant    #) Incisional Wound- RLQ well healed, will remove staples today   ???  #) HTN: not ideal; will add procardia XR 30 mg daily   ???  #) Glycemia - BGs is acceptable   ???  #) Anemia - Hgb today is 8 - Iron stores well repleted [09/03/18]  - on Aranesp once weekly   ???  #) Volume/Electrolytes:   Metabolic acidosis: stable on oral sodium bicarb     #) Nausea: after meds administration, likely related to GERD, recommend to take Pantoprazole bid      RTC next week  Update labs twice weekly    Abd Assalam, MD  Transplant Nephrology Fellow     I have personally seen and examined this patient with the fellow, performed the key portions of the E/M visit, discussed the case and concur with the fellow's documentation of history, physical exam, assessment, and treatment plan. Use, side effects and level of immunosuppressive meds reviewed and discussed with the patient and team.    Rosaland Lao, MD    Cc: [name]  Cc: Huntington, Melissa          Please contact the [facility] at  [phone] for any transplant related questions or concerns that may arise.

## 2018-09-29 ENCOUNTER — Encounter: Admit: 2018-09-29 | Discharge: 2018-09-29

## 2018-09-29 NOTE — Telephone Encounter
Reviewed transplant clinic notes and labs with Dr Abram Sander and pt.  Renal ultrasound with systolic acceleration.  Plan per Dr Juleen Starr and Dr Abram Sander is to continue and monitor.  Hold of on CO2 angiogram at this time.  No changes in medications.Michele Benitez

## 2018-09-30 ENCOUNTER — Encounter: Admit: 2018-09-30 | Discharge: 2018-09-30

## 2018-10-03 ENCOUNTER — Encounter: Admit: 2018-10-03 | Discharge: 2018-10-03

## 2018-10-03 DIAGNOSIS — R9439 Abnormal result of other cardiovascular function study: Secondary | ICD-10-CM

## 2018-10-03 DIAGNOSIS — E785 Hyperlipidemia, unspecified: Secondary | ICD-10-CM

## 2018-10-03 DIAGNOSIS — Z5181 Encounter for therapeutic drug level monitoring: Secondary | ICD-10-CM

## 2018-10-03 DIAGNOSIS — I10 Essential (primary) hypertension: Secondary | ICD-10-CM

## 2018-10-03 DIAGNOSIS — Z940 Kidney transplant status: Secondary | ICD-10-CM

## 2018-10-03 DIAGNOSIS — N186 End stage renal disease: Principal | ICD-10-CM

## 2018-10-03 MED ORDER — DARBEPOETIN ALFA IN POLYSORBAT 40 MCG/0.4 ML IJ SYRG
40 ug | Freq: Once | SUBCUTANEOUS | 0 refills | Status: CP
Start: 2018-10-03 — End: ?
  Administered 2018-10-03: 21:00:00 40 ug via SUBCUTANEOUS

## 2018-10-03 MED ORDER — DARBEPOETIN ALFA IN POLYSORBAT 40 MCG/0.4 ML IJ SYRG
40 ug | Freq: Once | SUBCUTANEOUS | 0 refills | Status: CN
Start: 2018-10-03 — End: ?

## 2018-10-03 NOTE — Progress Notes
Patient tolerated Aranesp injection.

## 2018-10-04 ENCOUNTER — Ambulatory Visit: Admit: 2018-10-04 | Discharge: 2018-10-04

## 2018-10-04 ENCOUNTER — Encounter: Admit: 2018-10-04 | Discharge: 2018-10-04

## 2018-10-04 ENCOUNTER — Ambulatory Visit: Admit: 2018-10-03 | Discharge: 2018-10-04

## 2018-10-04 DIAGNOSIS — I151 Hypertension secondary to other renal disorders: Secondary | ICD-10-CM

## 2018-10-04 DIAGNOSIS — Z940 Kidney transplant status: Secondary | ICD-10-CM

## 2018-10-04 DIAGNOSIS — D899 Disorder involving the immune mechanism, unspecified: Secondary | ICD-10-CM

## 2018-10-04 DIAGNOSIS — N186 End stage renal disease: Secondary | ICD-10-CM

## 2018-10-04 DIAGNOSIS — N189 Chronic kidney disease, unspecified: Secondary | ICD-10-CM

## 2018-10-04 DIAGNOSIS — Z5181 Encounter for therapeutic drug level monitoring: Secondary | ICD-10-CM

## 2018-10-04 DIAGNOSIS — D631 Anemia in chronic kidney disease: Secondary | ICD-10-CM

## 2018-10-04 DIAGNOSIS — B349 Viral infection, unspecified: Secondary | ICD-10-CM

## 2018-10-04 DIAGNOSIS — R9439 Abnormal result of other cardiovascular function study: Secondary | ICD-10-CM

## 2018-10-04 DIAGNOSIS — Z006 Encounter for examination for normal comparison and control in clinical research program: Secondary | ICD-10-CM

## 2018-10-04 DIAGNOSIS — E785 Hyperlipidemia, unspecified: Secondary | ICD-10-CM

## 2018-10-04 DIAGNOSIS — N179 Acute kidney failure, unspecified: Secondary | ICD-10-CM

## 2018-10-04 DIAGNOSIS — N2889 Other specified disorders of kidney and ureter: Secondary | ICD-10-CM

## 2018-10-04 DIAGNOSIS — I10 Essential (primary) hypertension: Secondary | ICD-10-CM

## 2018-10-04 LAB — COMPREHENSIVE METABOLIC PANEL
Lab: 0.3 mg/dL — ABNORMAL HIGH (ref 0.3–1.2)
Lab: 11 10*3/uL — ABNORMAL LOW (ref 3–12)
Lab: 136 mg/dL — ABNORMAL HIGH (ref 70–100)
Lab: 14 mL/min — ABNORMAL LOW (ref >60)
Lab: 3.4 mg/dL — ABNORMAL HIGH (ref 0.4–1.00)
Lab: 4.6 MMOL/L — ABNORMAL LOW (ref 3.5–5.1)
Lab: 6 U/L — ABNORMAL LOW (ref 7–56)
Lab: 8 U/L (ref 7–40)
Lab: 94 U/L (ref 25–110)
Lab: 138 mmol/L — ABNORMAL LOW (ref 12.0–16.0)
Lab: 139 mg/dL — ABNORMAL HIGH (ref 70–105)
Lab: 7 mg/dL (ref 8.5–10.6)

## 2018-10-04 LAB — PHOSPHORUS: Lab: 3.7 mg/dL — ABNORMAL HIGH (ref 2.0–4.5)

## 2018-10-04 LAB — PROTEIN/CR RATIO,UR RAN
Lab: 0.2 — ABNORMAL HIGH (ref <0.2)
Lab: 177 mg/dL
Lab: 70 mg/dL
Lab: 0.4

## 2018-10-04 LAB — CBC AND DIFF
Lab: 0 10*3/uL (ref 0–0.20)
Lab: 0 K/UL — ABNORMAL LOW (ref >60)
Lab: 1 % — ABNORMAL LOW (ref 0–2)
Lab: 2.4 M/UL — ABNORMAL LOW (ref 4.0–5.0)
Lab: 6 10*3/uL (ref 4.5–11.0)
Lab: 7.7 FL (ref 7–11)
Lab: 8.3 g/dL — ABNORMAL LOW (ref 12.0–15.0)
Lab: 9 % — ABNORMAL LOW (ref 24–44)
Lab: 0.5 x10-3/uL — ABNORMAL LOW (ref 0.9–5.1)
Lab: 107 mg/dL — ABNORMAL HIGH (ref <150)
Lab: 6.2 10*3/uL (ref 4.8–10.8)

## 2018-10-04 LAB — URINALYSIS DIPSTICK REFLEX TO CULTURE
Lab: 0.2
Lab: NEGATIVE
Lab: NEGATIVE
Lab: NEGATIVE
Lab: NEGATIVE
Lab: 1 (ref 1.003–1.035)
Lab: 5 K/UL (ref 5.0–8.0)
Lab: NEGATIVE MMOL/L (ref 21–30)
Lab: NEGATIVE ug/mL

## 2018-10-04 LAB — URINALYSIS MICROSCOPIC REFLEX TO CULTURE

## 2018-10-04 LAB — MAGNESIUM
Lab: 1.5 mg/dL — ABNORMAL LOW (ref 1.6–2.6)
Lab: 1.5 mg/dL — ABNORMAL LOW (ref >40)

## 2018-10-04 LAB — URIC ACID: Lab: 8.6 mg/dL — ABNORMAL HIGH (ref 2.0–7.0)

## 2018-10-04 LAB — PROTIME INR (PT): Lab: 1.1 mg/dL (ref 0.8–1.2)

## 2018-10-04 MED ORDER — DARBEPOETIN ALFA IN POLYSORBAT 40 MCG/0.4 ML IJ SYRG
40 ug | Freq: Once | SUBCUTANEOUS | 0 refills | Status: CN
Start: 2018-10-04 — End: ?

## 2018-10-04 MED FILL — MYCOPHENOLATE SODIUM 180 MG PO TBEC: 180 mg | ORAL | 30 days supply | Qty: 240 | Fill #2 | Status: CP

## 2018-10-04 NOTE — Patient Instructions
-   your creatinine is trending up, likely due to suspected stenosis of transplanted graft, as we discussed for now just continue to monitor until your kidney heals and address it as need arise.

## 2018-10-05 ENCOUNTER — Encounter: Admit: 2018-10-05 | Discharge: 2018-10-05

## 2018-10-05 DIAGNOSIS — N189 Chronic kidney disease, unspecified: Secondary | ICD-10-CM

## 2018-10-05 LAB — LIPID PROFILE
Lab: 123
Lab: 159
Lab: 25
Lab: 3
Lab: 47 % — ABNORMAL HIGH (ref 51–65)

## 2018-10-05 LAB — TACROLIMUS LC-MS/MS: Lab: 11

## 2018-10-05 MED ORDER — PANTOPRAZOLE 40 MG PO TBEC
40 mg | ORAL_TABLET | Freq: Two times a day (BID) | ORAL | 3 refills | 90.00000 days | Status: DC
Start: 2018-10-05 — End: 2019-05-01
  Filled 2018-10-25: qty 180, 30d supply, fill #1

## 2018-10-05 NOTE — Progress Notes
Office visit note from 10/04/18 reviewed.  Protonix increased to bid.

## 2018-10-06 ENCOUNTER — Encounter: Admit: 2018-10-06 | Discharge: 2018-10-06

## 2018-10-06 DIAGNOSIS — Z5181 Encounter for therapeutic drug level monitoring: Secondary | ICD-10-CM

## 2018-10-06 DIAGNOSIS — Z940 Kidney transplant status: Secondary | ICD-10-CM

## 2018-10-06 LAB — CBC AND DIFF
Lab: 0.1
Lab: 0.1
Lab: 0.5
Lab: 0.8
Lab: 1
Lab: 105 — ABNORMAL HIGH (ref 80.0–99.0)
Lab: 170
Lab: 5.3
Lab: 7.8
Lab: 8.4 — ABNORMAL LOW (ref 12.0–16.0)
Lab: 8.4 — ABNORMAL LOW (ref 18.0–47.0)
Lab: 82 — ABNORMAL HIGH (ref 40.0–75.0)
Lab: 0.5 10*3/uL — ABNORMAL LOW (ref 0.9–5.1)
Lab: 15 % — ABNORMAL HIGH (ref 11.5–14.5)
Lab: 2.5 uL — ABNORMAL LOW (ref 4.20–5.40)
Lab: 27 % — ABNORMAL LOW (ref 37.0–47.0)
Lab: 31 g/dL (ref 30.0–34.0)
Lab: 32 pg — ABNORMAL HIGH (ref 27.0–31.0)

## 2018-10-06 LAB — COMPREHENSIVE METABOLIC PANEL
Lab: 0.3 mg/dL (ref 0.2–1.2)
Lab: 14
Lab: 17 — AB (ref >59)
Lab: 2.9 mg/dL — ABNORMAL HIGH (ref 0.57–1.11)
Lab: 27 mg/dL — ABNORMAL HIGH (ref 9.8–20.1)
Lab: 6.7 g/dL (ref 6.4–8.3)
Lab: 8 U/L — AB (ref 0–55)
Lab: 9.8 mg/dL (ref 8.4–10.2)
Lab: 95 U/L (ref 40–150)

## 2018-10-06 LAB — URIC ACID: Lab: 9 mg/dL — ABNORMAL HIGH (ref 2.6–6.0)

## 2018-10-07 ENCOUNTER — Encounter: Admit: 2018-10-07 | Discharge: 2018-10-07

## 2018-10-07 DIAGNOSIS — Z1159 Encounter for screening for other viral diseases: Secondary | ICD-10-CM

## 2018-10-07 LAB — CULTURE-URINE W/SENSITIVITY
Lab: <10
Lab: <10 — AB

## 2018-10-07 MED ORDER — SODIUM BICARBONATE 650 MG PO TAB
1300 mg | ORAL_TABLET | Freq: Three times a day (TID) | ORAL | 3 refills | 30.00000 days | Status: DC
Start: 2018-10-07 — End: 2018-10-18
  Filled 2018-10-07: qty 90, 30d supply

## 2018-10-07 NOTE — Telephone Encounter
Received order from Dr. Juleen Starr for CO2 angiogram with possible intervention to be done end of July. Increase systolic acclerations seen on recent U/S throughout the renal cortex.  Reviewed by Dr. Wynetta Emery who agrees with this plan. Per Dr. Wynetta Emery, may schedule with him or Dr. Tina Griffiths at the end of July.    Phone call made to patient to schedule procedure. Patient's husband requests 10/18/18 as patient has appointments at [facility] that day.  Scheduled 10/18/18 @ 12noon with 11am checkin.  Patient to get COVID swab done in [place]. Will contact patient's PCP, Dr. Nona Dell regarding the swab.  Will also get any necessary pre procedure labs.

## 2018-10-10 ENCOUNTER — Encounter: Admit: 2018-10-10 | Discharge: 2018-10-10

## 2018-10-10 ENCOUNTER — Ambulatory Visit: Admit: 2018-10-10 | Discharge: 2018-10-11

## 2018-10-10 DIAGNOSIS — R9439 Abnormal result of other cardiovascular function study: Secondary | ICD-10-CM

## 2018-10-10 DIAGNOSIS — N186 End stage renal disease: Secondary | ICD-10-CM

## 2018-10-10 DIAGNOSIS — I10 Essential (primary) hypertension: Secondary | ICD-10-CM

## 2018-10-10 DIAGNOSIS — E785 Hyperlipidemia, unspecified: Secondary | ICD-10-CM

## 2018-10-10 MED ORDER — DARBEPOETIN ALFA IN POLYSORBAT 40 MCG/0.4 ML IJ SYRG
40 ug | Freq: Once | SUBCUTANEOUS | 0 refills | Status: CN
Start: 2018-10-10 — End: ?

## 2018-10-10 MED ORDER — AMOXICILLIN-POT CLAVULANATE 875-125 MG PO TAB
1 | ORAL_TABLET | Freq: Two times a day (BID) | ORAL | 0 refills | 7.00000 days | Status: DC
Start: 2018-10-10 — End: 2018-11-01

## 2018-10-10 MED ORDER — DARBEPOETIN ALFA IN POLYSORBAT 40 MCG/0.4 ML IJ SYRG
40 ug | Freq: Once | SUBCUTANEOUS | 0 refills | Status: CP
Start: 2018-10-10 — End: ?
  Administered 2018-10-10: 14:00:00 40 ug via SUBCUTANEOUS

## 2018-10-10 NOTE — Telephone Encounter
Patient's urine culture from 10/06/18 growing >100,000 E. Faecalis.  Per Dr. Abram Sander, patient to start Augmentin 875 mg po bid for 7 days.  Confirmed no allergy to PCN.

## 2018-10-10 NOTE — Patient Instructions
Post infusion emergency medical treatment: Go to the closest Emergency Department or call 911. For non-urgent questions or concerns, call [phone] after 0900 the following day(including weekends & holidays). For urgent medical questions, call [phone] and ask to speak to the On-Call Physician covering for the physician prescribing your infusion medication.

## 2018-10-11 ENCOUNTER — Encounter: Admit: 2018-10-11 | Discharge: 2018-10-11

## 2018-10-11 DIAGNOSIS — Z940 Kidney transplant status: Secondary | ICD-10-CM

## 2018-10-11 DIAGNOSIS — N186 End stage renal disease: Principal | ICD-10-CM

## 2018-10-11 DIAGNOSIS — D631 Anemia in chronic kidney disease: Secondary | ICD-10-CM

## 2018-10-11 DIAGNOSIS — Z5181 Encounter for therapeutic drug level monitoring: Secondary | ICD-10-CM

## 2018-10-11 LAB — TACROLIMUS IMMUNOASSAY (FK506): Lab: 11

## 2018-10-11 NOTE — Telephone Encounter
-----   Message from Helane Rima sent at 10/11/2018  9:01 AM CDT -----  Received an incoming call from:    [place] Name: Mr.Derossett    Relationship to Patient: Self    Callback Number: [phone]    Purpose of Call: Anderson Malta Mr Mcclain needs a call ASAP!  He said a medication Lake[place] was put on is really making her sick. Vomiting Etc.

## 2018-10-13 ENCOUNTER — Encounter: Admit: 2018-10-13 | Discharge: 2018-10-13

## 2018-10-13 DIAGNOSIS — Z940 Kidney transplant status: Secondary | ICD-10-CM

## 2018-10-13 DIAGNOSIS — Z5181 Encounter for therapeutic drug level monitoring: Secondary | ICD-10-CM

## 2018-10-13 LAB — CBC AND DIFF
Lab: 103 — ABNORMAL HIGH (ref 80.0–99.0)
Lab: 14
Lab: 169
Lab: 2.6 — ABNORMAL LOW (ref 4.20–5.40)
Lab: 27 — ABNORMAL LOW (ref 37.0–47.0)
Lab: 30
Lab: 31 — ABNORMAL HIGH (ref 27.0–31.0)
Lab: 6.8
Lab: 8.4 — ABNORMAL LOW (ref 12.0–16.0)
Lab: 8.7 — ABNORMAL LOW (ref 18.0–47.0)
Lab: 83 — ABNORMAL HIGH (ref 40.0–75.0)
Lab: 9.7

## 2018-10-13 LAB — URINALYSIS DIPSTICK REFLEX TO CULTURE
Lab: 0.2
Lab: NEGATIVE
Lab: NEGATIVE
Lab: NEGATIVE
Lab: NEGATIVE
Lab: NEGATIVE

## 2018-10-13 LAB — URINALYSIS MICROSCOPIC REFLEX TO CULTURE

## 2018-10-13 LAB — COMPREHENSIVE METABOLIC PANEL
Lab: 122 — ABNORMAL HIGH (ref 70–105)
Lab: 0.2 mg/dL
Lab: 139 mmol/L
Lab: 17 mmol/L — ABNORMAL LOW (ref 22–29)
Lab: 3.4 g/dL — ABNORMAL LOW (ref 3.5–5.0)
Lab: 4.3 mmol/L
Lab: 6.4 g/dL
Lab: 8 U/L
Lab: 80 U/L
Lab: 9 U/L
Lab: 9.5 mg/dL

## 2018-10-13 LAB — PROTEIN/CR RATIO,UR RAN
Lab: 0.2 — ABNORMAL HIGH (ref <0.2)
Lab: 22 mg/dL — ABNORMAL HIGH (ref 1–14)
Lab: 91 mg/dL — ABNORMAL HIGH (ref 9.8–20.1)

## 2018-10-13 LAB — URIC ACID: Lab: 8.4 — ABNORMAL HIGH (ref 2.6–6.0)

## 2018-10-13 LAB — MAGNESIUM: Lab: 1.4 mmol/L — ABNORMAL LOW (ref 1.6–2.6)

## 2018-10-14 ENCOUNTER — Encounter: Admit: 2018-10-14 | Discharge: 2018-10-14

## 2018-10-14 DIAGNOSIS — Z940 Kidney transplant status: Secondary | ICD-10-CM

## 2018-10-14 DIAGNOSIS — Z5181 Encounter for therapeutic drug level monitoring: Secondary | ICD-10-CM

## 2018-10-14 LAB — CBC AND DIFF
Lab: 0.4
Lab: 0.5
Lab: 0.8
Lab: 0.8 — ABNORMAL LOW (ref 0.9–5.1)
Lab: 105 — ABNORMAL HIGH (ref 80.0–99.0)
Lab: 12 — ABNORMAL LOW (ref 18.0–47.0)
Lab: 15 — ABNORMAL HIGH (ref 11.5–14.5)
Lab: 162
Lab: 2.5 — ABNORMAL LOW (ref 4.20–5.40)
Lab: 5.4
Lab: 6.7
Lab: 79 — ABNORMAL HIGH (ref 40.0–75.0)
Lab: 8.2 — ABNORMAL LOW (ref 12.0–16.0)
Lab: 26 % — ABNORMAL LOW (ref 37.0–47.0)
Lab: 30 g/dL (ref 30.0–34.0)
Lab: 32 pg — ABNORMAL HIGH (ref 27.0–31.0)
Lab: 6.7 10*3/uL (ref 4.8–10.8)

## 2018-10-14 LAB — TACROLIMUS IMMUNOASSAY (FK506): Lab: 14 ug/L (ref 5.0–20.0)

## 2018-10-16 ENCOUNTER — Ambulatory Visit: Admit: 2018-10-16 | Discharge: 2018-10-17

## 2018-10-16 DIAGNOSIS — Z01818 Encounter for other preprocedural examination: Principal | ICD-10-CM

## 2018-10-16 NOTE — Progress Notes
Patient arrived to [facility] clinic for COVID-19 testing on 10/16/18 at 0917. Patient identity confirmed via photo I.D. Nasopharyngeal procedure explained to the patient.   Nasopharyngeal swab completed - right nare.  Patient education provided given and instructed patient self isolate until contacted w/ results and further instructions.   Swab collected by Beverly Milch, RN.    Date symptoms began/reason for testing: preop clearance

## 2018-10-17 ENCOUNTER — Encounter: Admit: 2018-10-17 | Discharge: 2018-10-17

## 2018-10-17 DIAGNOSIS — Z940 Kidney transplant status: Secondary | ICD-10-CM

## 2018-10-17 DIAGNOSIS — Z1159 Encounter for screening for other viral diseases: Secondary | ICD-10-CM

## 2018-10-17 DIAGNOSIS — Z5181 Encounter for therapeutic drug level monitoring: Secondary | ICD-10-CM

## 2018-10-17 LAB — URINALYSIS MICROSCOPIC REFLEX TO CULTURE

## 2018-10-17 LAB — URINALYSIS DIPSTICK REFLEX TO CULTURE
Lab: 0.2
Lab: NEGATIVE
Lab: NEGATIVE
Lab: NEGATIVE
Lab: NEGATIVE
Lab: NEGATIVE

## 2018-10-17 LAB — COMPREHENSIVE METABOLIC PANEL
Lab: 0.2 mg/dL (ref 0.2–1.2)
Lab: 13 meq/L (ref 0–14)
Lab: 131 mg/dL — ABNORMAL HIGH (ref 70–105)
Lab: 139 mmol/L (ref 136–145)
Lab: 15 mL/min/{1.73_m2} — AB (ref >59)
Lab: 3.3 mg/dL — ABNORMAL HIGH (ref 0.57–1.11)
Lab: 3.5 g/dL (ref 3.5–5.0)
Lab: 31 mg/dL — ABNORMAL HIGH (ref 9.8–20.1)
Lab: 6.4 g/dL (ref 6.4–8.3)
Lab: 8 U/L (ref 5–34)
Lab: 82 U/L (ref 40–150)
Lab: 9 U/L (ref 0–55)
Lab: 9.3 mg/dL (ref 8.4–10.2)

## 2018-10-17 LAB — PHOSPHORUS: Lab: 3.3 mg/dL — ABNORMAL HIGH (ref 2.3–4.7)

## 2018-10-17 LAB — COVID-19 (SARS-COV-2) PCR

## 2018-10-17 LAB — MAGNESIUM: Lab: 1.4 mg/dL — ABNORMAL LOW (ref 1.6–2.6)

## 2018-10-17 LAB — URIC ACID: Lab: 8.5 mg/dL — ABNORMAL HIGH (ref 2.6–6.0)

## 2018-10-17 LAB — PROTEIN/CR RATIO,UR RAN: Lab: 0.2 mg/mg — ABNORMAL HIGH (ref <0.2)

## 2018-10-17 NOTE — Progress Notes
Contacted patient and confirmed name and DOB. Patient advised that COVID-19 test results are negative. Advised that patient can continue with the procedure and should follow pre-procedure instructions. Advised patient to continue with home quarantine until procedure. Advised that if they develop any concerning symptoms prior to the procedure to contact their procedure team, specialist, and/or PCP for assistance.      , DNP, APRN, FNP-BC, CCRN, SCRN, CNRN

## 2018-10-18 ENCOUNTER — Encounter: Admit: 2018-10-18 | Discharge: 2018-10-18

## 2018-10-18 ENCOUNTER — Ambulatory Visit: Admit: 2018-10-18 | Discharge: 2018-10-18

## 2018-10-18 ENCOUNTER — Ambulatory Visit: Admit: 2018-10-18 | Discharge: 2018-10-19

## 2018-10-18 DIAGNOSIS — Z940 Kidney transplant status: Secondary | ICD-10-CM

## 2018-10-18 DIAGNOSIS — I151 Hypertension secondary to other renal disorders: Secondary | ICD-10-CM

## 2018-10-18 DIAGNOSIS — D899 Disorder involving the immune mechanism, unspecified: Secondary | ICD-10-CM

## 2018-10-18 DIAGNOSIS — Z5181 Encounter for therapeutic drug level monitoring: Secondary | ICD-10-CM

## 2018-10-18 DIAGNOSIS — B349 Viral infection, unspecified: Secondary | ICD-10-CM

## 2018-10-18 DIAGNOSIS — N2889 Other specified disorders of kidney and ureter: Secondary | ICD-10-CM

## 2018-10-18 DIAGNOSIS — D631 Anemia in chronic kidney disease: Secondary | ICD-10-CM

## 2018-10-18 DIAGNOSIS — N186 End stage renal disease: Secondary | ICD-10-CM

## 2018-10-18 DIAGNOSIS — I10 Essential (primary) hypertension: Secondary | ICD-10-CM

## 2018-10-18 DIAGNOSIS — I701 Atherosclerosis of renal artery: Secondary | ICD-10-CM

## 2018-10-18 DIAGNOSIS — E785 Hyperlipidemia, unspecified: Secondary | ICD-10-CM

## 2018-10-18 DIAGNOSIS — R9439 Abnormal result of other cardiovascular function study: Secondary | ICD-10-CM

## 2018-10-18 LAB — TACROLIMUS IMMUNOASSAY (FK506): Lab: 13 ug/L (ref 5.0–20.0)

## 2018-10-18 LAB — COMPREHENSIVE METABOLIC PANEL
Lab: 130 mg/dL — ABNORMAL HIGH (ref 70–100)
Lab: 141 MMOL/L — ABNORMAL LOW (ref 137–147)
Lab: 2.5 mg/dL — ABNORMAL HIGH (ref 0.4–1.00)
Lab: 4.5 MMOL/L — ABNORMAL LOW (ref 3.5–5.1)
Lab: 6.2 g/dL (ref 6.0–8.0)

## 2018-10-18 LAB — URINALYSIS MICROSCOPIC REFLEX TO CULTURE

## 2018-10-18 LAB — URINALYSIS DIPSTICK REFLEX TO CULTURE
Lab: NEGATIVE
Lab: NEGATIVE
Lab: NEGATIVE
Lab: NEGATIVE

## 2018-10-18 LAB — PROTEIN/CR RATIO,UR RAN
Lab: 105 mg/dL
Lab: 37 mg/dL

## 2018-10-18 LAB — CBC AND DIFF
Lab: 2.4 M/UL — ABNORMAL LOW (ref 4.0–5.0)
Lab: 6.6 10*3/uL (ref 4.5–11.0)

## 2018-10-18 LAB — MAGNESIUM: Lab: 1.3 mg/dL — ABNORMAL LOW (ref 1.6–2.6)

## 2018-10-18 LAB — PHOSPHORUS: Lab: 3.3 mg/dL — ABNORMAL HIGH (ref 2.0–4.5)

## 2018-10-18 LAB — URIC ACID: Lab: 8.5 mg/dL — ABNORMAL HIGH (ref 2.0–7.0)

## 2018-10-18 MED ORDER — DARBEPOETIN ALFA IN POLYSORBAT 40 MCG/0.4 ML IJ SYRG
40 ug | Freq: Once | SUBCUTANEOUS | 0 refills | Status: CN
Start: 2018-10-18 — End: ?

## 2018-10-18 MED ORDER — DARBEPOETIN ALFA IN POLYSORBAT 40 MCG/0.4 ML IJ SYRG
40 ug | Freq: Once | SUBCUTANEOUS | 0 refills | Status: CP
Start: 2018-10-18 — End: ?
  Administered 2018-10-18: 19:00:00 40 ug via SUBCUTANEOUS

## 2018-10-18 NOTE — Progress Notes
1425:  Patient tolerated injection without difficulty.

## 2018-10-19 DIAGNOSIS — D631 Anemia in chronic kidney disease: Secondary | ICD-10-CM

## 2018-10-19 LAB — TACROLIMUS LC-MS/MS: Lab: 16 % (ref 0–2)

## 2018-10-19 LAB — BK VIRUS DNA, QUANT PLASMA

## 2018-10-20 ENCOUNTER — Encounter: Admit: 2018-10-20 | Discharge: 2018-10-20

## 2018-10-20 MED ORDER — TACROLIMUS 1 MG PO CAP
1 mg | ORAL_CAPSULE | Freq: Two times a day (BID) | ORAL | 3 refills | 30.00000 days | Status: DC
Start: 2018-10-20 — End: 2018-11-02
  Filled 2018-10-25: qty 180, 30d supply, fill #1

## 2018-10-20 NOTE — Progress Notes
Office visit note from 10/18/18 reviewed.  Tacrolimus decreased after clinic visit from 2 mg bid to 1 mg bid.  Patient to hold sodium bicarbonate r/t hypertension.  Patient did not have CO2 arteriogram because she was not fasting.  She states it is rescheduled to 10/27/18.

## 2018-10-20 NOTE — Telephone Encounter
Phone call made to patient to reschedule CO2 angiogram that was cancelled on 10/18/18 due to patient eating prior to procedure. Rescheduled for 10/24/18. Pre procedure instructions reviewed with patient as follows: NPO x 8 hours prior to procedure, clear liquids until 10 AM, bring a driver, reviewed medications, check into 2nd floor IR.  Patient wrote instructions down as she has no email and no My Chart.  She verbalized understanding and was provided with name and contact information for this [facility] to call with additional questions.

## 2018-10-21 ENCOUNTER — Encounter: Admit: 2018-10-21 | Discharge: 2018-10-21

## 2018-10-24 ENCOUNTER — Ambulatory Visit: Admit: 2018-10-25 | Discharge: 2018-10-25

## 2018-10-24 ENCOUNTER — Ambulatory Visit: Admit: 2018-10-25 | Discharge: 2018-10-26

## 2018-10-24 ENCOUNTER — Ambulatory Visit: Admit: 2018-10-24 | Discharge: 2018-10-25

## 2018-10-24 ENCOUNTER — Encounter: Admit: 2018-10-24 | Discharge: 2018-10-24

## 2018-10-24 DIAGNOSIS — T8619 Other complication of kidney transplant: Principal | ICD-10-CM

## 2018-10-24 DIAGNOSIS — Z940 Kidney transplant status: Secondary | ICD-10-CM

## 2018-10-24 DIAGNOSIS — D631 Anemia in chronic kidney disease: Secondary | ICD-10-CM

## 2018-10-24 DIAGNOSIS — I701 Atherosclerosis of renal artery: Secondary | ICD-10-CM

## 2018-10-24 MED ORDER — ASPIRIN 81 MG PO TBEC
ORAL_TABLET | 3 refills | Status: CN
Start: 2018-10-24 — End: ?

## 2018-10-24 MED ORDER — IOPAMIDOL 61 % IV SOLN
30 mL | Freq: Once | INTRA_ARTERIAL | 0 refills | Status: CP
Start: 2018-10-24 — End: ?
  Administered 2018-10-24: 20:00:00 30 mL via INTRA_ARTERIAL

## 2018-10-24 MED ORDER — MIDAZOLAM 1 MG/ML IJ SOLN
1 mg | Freq: Once | INTRAVENOUS | 0 refills | Status: CP
Start: 2018-10-24 — End: ?
  Administered 2018-10-24: 18:00:00 1 mg via INTRAVENOUS

## 2018-10-24 MED ORDER — FENTANYL CITRATE (PF) 50 MCG/ML IJ SOLN
0 refills | Status: CP
Start: 2018-10-24 — End: ?
  Administered 2018-10-24 (×2): 50 ug via INTRAVENOUS

## 2018-10-24 MED ORDER — MIDAZOLAM 1 MG/ML IJ SOLN
0 refills | Status: CP
Start: 2018-10-24 — End: ?
  Administered 2018-10-24: 18:00:00 1 mg via INTRAVENOUS

## 2018-10-24 MED ORDER — NALOXONE 0.4 MG/ML IJ SOLN
0.8 mg | INTRAVENOUS | 0 refills | Status: DC | PRN
Start: 2018-10-24 — End: 2018-10-29

## 2018-10-24 MED ORDER — NITROGLYCERIN(#) INJ 100MCG/ML IJ SOLN
0 refills | Status: CP
Start: 2018-10-24 — End: ?
  Administered 2018-10-24: 19:00:00 100 ug via INTRA_ARTERIAL

## 2018-10-24 MED ORDER — FLUMAZENIL 0.1 MG/ML IV SOLN
0.2 mg | INTRAVENOUS | 0 refills | Status: DC | PRN
Start: 2018-10-24 — End: 2018-10-29

## 2018-10-24 MED ORDER — HEPARIN (PORCINE) 1,000 UNIT/ML IJ SOLN
5000 [IU] | Freq: Once | INTRAVENOUS | 0 refills | Status: CP
Start: 2018-10-24 — End: ?
  Administered 2018-10-24: 19:00:00 5000 [IU] via INTRAVENOUS

## 2018-10-24 NOTE — Progress Notes
Per Dr. Juleen Starr, patient to restart ASA and to have Doppler of renal artery.  Patient unable to come to [facility] Friday.  Doppler scheduled for 10/31/18 at 1:00 with 12:30 check in time.

## 2018-10-24 NOTE — Patient Instructions
INTERVENTIONAL RADIOLOGY DISCHARGE INSTRUCTIONS  ARTERIOGRAM    An arteriogram or angiogram is a procedure in which a tiny catheter is inserted into an artery and fluoroscopy (x-rays) and contrast dye are used by the Interventional Radiologist to diagnose or treat certain conditions.??? Some examples of conditions that involve arteriography include narrowed or blocked arteries, arterial malformations, and arterial bleeding that may occur from trauma.  Arteriography can be used in many different parts of the body and may also be used during treatment of certain malignancies.???????????????????????????????????????????????????????????????????????????  This procedure is being done for you for the following reason: __________________________________.???  POST-PROCEDURE ACTIVITY:  ??? A responsible adult must drive you home after the procedure.  ??? If you receive sedation or anesthesia, do not drive, operate heavy machinery or do anything that requires concentration for at least 24 hours.???  ??? It is recommended that a responsible adult be with you until morning.???  ??? Avoid any exertion for one week.??? Exertion is lifting over 10 lbs., pushing, pulling or straining.???  ??? Avoid excessive bending, stooping, or stair climbing for 2 days.??? It is okay to go up stairs or bend over but take it slowly and keep it to a minimum.???  ??? You may be up and about while relaxing at home as you recover.  POST-PROCEDURE SITE CARE:  ??? You will have a bandage over the site.??? Keep this dry.??? You may remove it in 24 hours.  ??? You may shower in 24 hours after removing the bandage.??? Wash and dry the site gently.  ??? Do not submerge the site underwater for one week (no tub bath, swimming, hot tub, etc.)  ??? Do not use ointments, creams or powders on the puncture site.  ??? Be sure your hands are clean when touching near the site.  ??? Inspect the site daily.  DIET/MEDICATIONS:  ??? You may resume your previous diet after the procedure.  ??? If you receive sedation or narcotic pain medications, avoid any foods or beverages containing alcohol for at least 24 hours.  ??? Please see the Medication Reconciliation sheet for instructions regarding resuming your home medications.  WHEN TO CALL THE DOCTOR:????????????????????????  ??? If you have significant bleeding (more than a teaspoon) or swelling at the site (bigger than a golf ball), lie down, apply firm pressure to the site and call 911. Bleeding from a large vessel requires professional help.  ??? If you have signs of infection such as: Chills, body aches, fever greater than 101F, redness, swelling or warmth at the puncture site.???  ??? If you have???severe pain unrelieved by medication.??? It is common to have mild soreness or slight swelling at the puncture site for 1-2 weeks.  ??? If you have numbness, tingling, or weakness in the leg below the puncture site or your leg becomes cold and pale.  ??? If you have persistent nausea or vomiting.  You or your caregiver should call 911 for severe symptoms such as excessive bleeding, chest pain, shortness of breath or loss of consciousness.  For any of the above symptoms or for problems or concerns related to the procedure,??????call [phone] for Monday-Friday 7-5.??? After-hours and weekends, please call??????[phone] and ask for the Interventional Radiology Resident on-call.

## 2018-10-24 NOTE — Progress Notes
Discharge instructions reviewed with patient. Site dry and intact. Patient transported via wheelchair to lobby. Driver present.

## 2018-10-24 NOTE — Other
Immediate Post Procedure Note    Date:  10/24/2018                                       Performing Provider:  April Manson, MD    Consent:  Consent obtained from patient.  Time out performed: Consent obtained, correct patient verified, correct procedure verified, correct site verified, patient marked as necessary.  Pre/Post Procedure Diagnosis:  Renal artery stenosis  Indications:  Doppler study suggesting transplant renal artery stenosis, creatinine elevated    Anesthesia: Local 10 mL 2% lidocaine without epinephrine with IV sedation versed and fentanyl  Procedure(s):  Renal transplant arteriogram and angioplasty  Findings:  Tortuous proximal renal artery with two moderate tandem stenoses. Both areas dilated with 5 mm angioplasty balloon.      Estimated Blood Loss:  None/Negligible  Specimen(s) Removed/Disposition:  None  Complications: None  Patient Tolerated Procedure: Well  Post-Procedure Condition:  stable    April Manson, MD  Pager [phone]

## 2018-10-25 ENCOUNTER — Encounter: Admit: 2018-10-25 | Discharge: 2018-10-25

## 2018-10-25 ENCOUNTER — Ambulatory Visit: Admit: 2018-10-25 | Discharge: 2018-10-25

## 2018-10-25 ENCOUNTER — Ambulatory Visit: Admit: 2018-10-24 | Discharge: 2018-10-25

## 2018-10-25 DIAGNOSIS — I151 Hypertension secondary to other renal disorders: Secondary | ICD-10-CM

## 2018-10-25 DIAGNOSIS — T8619 Other complication of kidney transplant: Principal | ICD-10-CM

## 2018-10-25 DIAGNOSIS — Z940 Kidney transplant status: Secondary | ICD-10-CM

## 2018-10-25 DIAGNOSIS — N186 End stage renal disease: Secondary | ICD-10-CM

## 2018-10-25 DIAGNOSIS — Z5181 Encounter for therapeutic drug level monitoring: Secondary | ICD-10-CM

## 2018-10-25 DIAGNOSIS — N189 Chronic kidney disease, unspecified: Secondary | ICD-10-CM

## 2018-10-25 DIAGNOSIS — N2889 Other specified disorders of kidney and ureter: Secondary | ICD-10-CM

## 2018-10-25 DIAGNOSIS — D631 Anemia in chronic kidney disease: Secondary | ICD-10-CM

## 2018-10-25 DIAGNOSIS — E785 Hyperlipidemia, unspecified: Secondary | ICD-10-CM

## 2018-10-25 DIAGNOSIS — K831 Obstruction of bile duct: Secondary | ICD-10-CM

## 2018-10-25 DIAGNOSIS — I701 Atherosclerosis of renal artery: Secondary | ICD-10-CM

## 2018-10-25 DIAGNOSIS — R9439 Abnormal result of other cardiovascular function study: Secondary | ICD-10-CM

## 2018-10-25 DIAGNOSIS — I10 Essential (primary) hypertension: Secondary | ICD-10-CM

## 2018-10-25 DIAGNOSIS — N200 Calculus of kidney: Secondary | ICD-10-CM

## 2018-10-25 LAB — CBC AND DIFF
Lab: 0
Lab: 0
Lab: 0.4
Lab: 0.5
Lab: 0.8
Lab: 103 — ABNORMAL HIGH (ref 80.0–99.0)
Lab: 13 — ABNORMAL LOW (ref 18.0–47.0)
Lab: 4.7
Lab: 6.1
Lab: 78 — ABNORMAL HIGH (ref 40.0–75.0)
Lab: 0 10*3/uL (ref 0–0.20)
Lab: 2.5 M/UL — ABNORMAL LOW (ref 4.0–5.0)
Lab: 6.6 10*3/uL (ref 4.5–11.0)
Lab: 0.8 10*3/uL — ABNORMAL LOW (ref 0.9–5.1)
Lab: 14 % — ABNORMAL HIGH (ref 11.5–14.5)
Lab: 145 10*3/uL
Lab: 2.7 10*3/uL — ABNORMAL LOW (ref 4.20–5.40)
Lab: 31 g/dL (ref 30.0–34.0)
Lab: 32 pg — ABNORMAL HIGH (ref 27.0–31.0)
Lab: 6 x10-3/uL (ref 4.8–10.8)
Lab: 8.8 K/UL — ABNORMAL LOW (ref 12.0–16.0)

## 2018-10-25 LAB — PROTEIN/CR RATIO,UR RAN
Lab: 57 — ABNORMAL HIGH (ref 0–14)
Lab: 113 mg/dL
Lab: 37 mg/dL
Lab: 0.2 mg/dL — ABNORMAL HIGH (ref <0.2)
Lab: 16 mg/dL — ABNORMAL HIGH (ref 1–14)

## 2018-10-25 LAB — COMPREHENSIVE METABOLIC PANEL
Lab: 130 — ABNORMAL HIGH (ref 70–105)
Lab: 23 — AB (ref >59)
Lab: 0.3 mg/dL — ABNORMAL HIGH (ref 0.3–1.2)
Lab: 132 mg/dL — ABNORMAL HIGH (ref 70–100)
Lab: 141 MMOL/L — ABNORMAL LOW (ref 137–147)
Lab: 19 MMOL/L — ABNORMAL LOW (ref 21–30)
Lab: 2.4 mg/dL — ABNORMAL HIGH (ref 0.4–1.00)
Lab: 21 mL/min — ABNORMAL LOW (ref >60)
Lab: 25 mL/min — ABNORMAL LOW (ref >60)
Lab: 4 g/dL — ABNORMAL LOW (ref 3.5–5.0)
Lab: 4.5 MMOL/L — ABNORMAL LOW (ref 3.5–5.1)
Lab: 6.3 g/dL (ref 6.0–8.0)
Lab: 67 U/L (ref 25–110)
Lab: 8 U/L (ref 7–40)
Lab: 8 U/L (ref 7–56)
Lab: 9 10*3/uL — ABNORMAL LOW (ref 3–12)
Lab: 0.1 mg/dL — ABNORMAL LOW (ref 0.2–1.2)
Lab: 139 mmol/L
Lab: 3.6 g/dL
Lab: 4.4 mmol/L
Lab: 6.4 g/dL
Lab: 69 U/L
Lab: 8 U/L
Lab: 8 U/L
Lab: 9.2 mg/dL

## 2018-10-25 LAB — MAGNESIUM
Lab: 1.4 mg/dL — ABNORMAL LOW (ref 1.6–2.6)
Lab: 1.6 mmol/L — ABNORMAL HIGH (ref 98–107)

## 2018-10-25 LAB — URINALYSIS DIPSTICK REFLEX TO CULTURE
Lab: 0.2
Lab: NEGATIVE
Lab: NEGATIVE
Lab: NEGATIVE
Lab: NEGATIVE
Lab: NEGATIVE
Lab: NEGATIVE
Lab: NEGATIVE
Lab: NEGATIVE
Lab: NEGATIVE
Lab: NEGATIVE
Lab: NEGATIVE

## 2018-10-25 LAB — URIC ACID
Lab: 7.7 mg/dL — ABNORMAL HIGH (ref 2.0–7.0)
Lab: 7.7 mmol/L — ABNORMAL HIGH (ref 2.6–6.0)

## 2018-10-25 LAB — URINALYSIS MICROSCOPIC REFLEX TO CULTURE

## 2018-10-25 LAB — PHOSPHORUS
Lab: 3.1
Lab: 3.7 mg/dL — ABNORMAL HIGH (ref 2.0–4.5)

## 2018-10-25 MED ORDER — DARBEPOETIN ALFA IN POLYSORBAT 40 MCG/0.4 ML IJ SYRG
40 ug | Freq: Once | SUBCUTANEOUS | 0 refills | Status: CP
Start: 2018-10-25 — End: ?
  Administered 2018-10-25: 20:00:00 40 ug via SUBCUTANEOUS

## 2018-10-25 MED ORDER — DARBEPOETIN ALFA IN POLYSORBAT 40 MCG/0.4 ML IJ SYRG
40 ug | Freq: Once | SUBCUTANEOUS | 0 refills | Status: CN
Start: 2018-10-25 — End: ?

## 2018-10-25 MED FILL — PREDNISONE 5 MG PO TAB: 5 mg | ORAL | 30 days supply | Qty: 90 | Fill #2 | Status: CP

## 2018-10-25 NOTE — Progress Notes
Patient tolerated injection, no reaction noted.

## 2018-10-26 ENCOUNTER — Encounter: Admit: 2018-10-26 | Discharge: 2018-10-26

## 2018-10-26 DIAGNOSIS — Z5181 Encounter for therapeutic drug level monitoring: Secondary | ICD-10-CM

## 2018-10-26 DIAGNOSIS — Z940 Kidney transplant status: Secondary | ICD-10-CM

## 2018-10-26 LAB — TACROLIMUS LC-MS/MS: Lab: 6.9

## 2018-10-26 LAB — TACROLIMUS IMMUNOASSAY (FK506): Lab: 8.4

## 2018-10-27 ENCOUNTER — Encounter: Admit: 2018-10-27 | Discharge: 2018-10-27

## 2018-10-27 DIAGNOSIS — Z5181 Encounter for therapeutic drug level monitoring: Secondary | ICD-10-CM

## 2018-10-27 DIAGNOSIS — Z940 Kidney transplant status: Secondary | ICD-10-CM

## 2018-10-27 LAB — COMPREHENSIVE METABOLIC PANEL
Lab: 124 — ABNORMAL HIGH (ref 70–105)
Lab: 22 — ABNORMAL LOW (ref >59)
Lab: 3.7
Lab: 7
Lab: 0.2 mg/dL (ref 0.2–1.2)
Lab: 140 mmol/L (ref 136–145)
Lab: 2.3 mg/dL — ABNORMAL HIGH (ref 0.57–1.11)
Lab: 24 mg/dL — ABNORMAL HIGH (ref 9.8–20.1)
Lab: 6.3 g/dL — ABNORMAL LOW (ref 6.4–8.3)
Lab: 72 U/L (ref 40–150)
Lab: 9 U/L (ref 0–55)
Lab: 9 mg/dL (ref 8.4–10.2)

## 2018-10-27 LAB — PROTEIN/CR RATIO,UR RAN
Lab: 0.2 mmol/L — ABNORMAL HIGH (ref <0.2)
Lab: 20 — ABNORMAL HIGH (ref 1–14)

## 2018-10-27 LAB — URINALYSIS MICROSCOPIC REFLEX TO CULTURE

## 2018-10-27 LAB — CBC AND DIFF
Lab: 0.1
Lab: 0.1
Lab: 0.6
Lab: 0.9
Lab: 4.6
Lab: 0.8 10*3/uL — ABNORMAL LOW (ref 0.9–5.1)
Lab: 6.1 10*3/uL (ref 4.8–10.8)

## 2018-10-27 LAB — MAGNESIUM: Lab: 1.5 mg/dL — ABNORMAL LOW (ref 1.6–2.6)

## 2018-10-27 LAB — URIC ACID: Lab: 7.3 mg/dL — ABNORMAL HIGH (ref 2.6–6.0)

## 2018-10-27 LAB — URINALYSIS DIPSTICK REFLEX TO CULTURE
Lab: 0.2
Lab: NEGATIVE
Lab: NEGATIVE % — ABNORMAL HIGH (ref 11.5–14.5)
Lab: NEGATIVE g/dL (ref 30.0–34.0)
Lab: NEGATIVE — ABNORMAL HIGH (ref 40.0–75.0)
Lab: NEGATIVE — ABNORMAL LOW (ref 18.0–47.0)

## 2018-10-28 ENCOUNTER — Encounter: Admit: 2018-10-28 | Discharge: 2018-10-28

## 2018-10-28 DIAGNOSIS — Z006 Encounter for examination for normal comparison and control in clinical research program: Secondary | ICD-10-CM

## 2018-10-31 ENCOUNTER — Encounter: Admit: 2018-10-31 | Discharge: 2018-10-31

## 2018-10-31 DIAGNOSIS — Z940 Kidney transplant status: Secondary | ICD-10-CM

## 2018-10-31 DIAGNOSIS — Z5181 Encounter for therapeutic drug level monitoring: Secondary | ICD-10-CM

## 2018-10-31 LAB — TACROLIMUS IMMUNOASSAY (FK506): Lab: 4.9 ug/L — ABNORMAL LOW (ref 5.0–20.0)

## 2018-10-31 NOTE — Progress Notes
Per Dr. Abram Sander, patient to be set up for 24-hour urine collection for stone analysis with [facility].  Order faxed to [phone].

## 2018-11-01 ENCOUNTER — Encounter: Admit: 2018-11-01 | Discharge: 2018-11-01

## 2018-11-01 ENCOUNTER — Ambulatory Visit: Admit: 2018-11-01 | Discharge: 2018-11-01

## 2018-11-01 DIAGNOSIS — Z5181 Encounter for therapeutic drug level monitoring: Secondary | ICD-10-CM

## 2018-11-01 DIAGNOSIS — R9439 Abnormal result of other cardiovascular function study: Secondary | ICD-10-CM

## 2018-11-01 DIAGNOSIS — Z940 Kidney transplant status: Secondary | ICD-10-CM

## 2018-11-01 DIAGNOSIS — I701 Atherosclerosis of renal artery: Secondary | ICD-10-CM

## 2018-11-01 DIAGNOSIS — Z006 Encounter for examination for normal comparison and control in clinical research program: Secondary | ICD-10-CM

## 2018-11-01 DIAGNOSIS — N186 End stage renal disease: Secondary | ICD-10-CM

## 2018-11-01 DIAGNOSIS — D899 Disorder involving the immune mechanism, unspecified: Principal | ICD-10-CM

## 2018-11-01 DIAGNOSIS — I151 Hypertension secondary to other renal disorders: Secondary | ICD-10-CM

## 2018-11-01 DIAGNOSIS — D631 Anemia in chronic kidney disease: Secondary | ICD-10-CM

## 2018-11-01 DIAGNOSIS — E785 Hyperlipidemia, unspecified: Secondary | ICD-10-CM

## 2018-11-01 DIAGNOSIS — I10 Essential (primary) hypertension: Secondary | ICD-10-CM

## 2018-11-01 DIAGNOSIS — N2889 Other specified disorders of kidney and ureter: Secondary | ICD-10-CM

## 2018-11-01 LAB — CBC AND DIFF
Lab: 2.5 M/UL — ABNORMAL LOW (ref 4.0–5.0)
Lab: 4.6 10*3/uL (ref 4.5–11.0)

## 2018-11-01 LAB — URIC ACID: Lab: 7.2 mg/dL — ABNORMAL HIGH (ref 2.0–7.0)

## 2018-11-01 LAB — COMPREHENSIVE METABOLIC PANEL
Lab: 109 mg/dL — ABNORMAL HIGH (ref 70–100)
Lab: 139 MMOL/L — ABNORMAL LOW (ref 137–147)
Lab: 2.3 mg/dL — ABNORMAL HIGH (ref 0.4–1.00)
Lab: 3.9 MMOL/L — ABNORMAL LOW (ref 3.5–5.1)
Lab: 4.2 g/dL — ABNORMAL LOW (ref 3.5–5.0)

## 2018-11-01 LAB — URINALYSIS DIPSTICK REFLEX TO CULTURE
Lab: NEGATIVE 10*3/uL (ref 0–0.20)
Lab: NEGATIVE mL/min — ABNORMAL LOW (ref 0–0.45)
Lab: NEGATIVE
Lab: NEGATIVE
Lab: NEGATIVE
Lab: NEGATIVE

## 2018-11-01 LAB — RESEARCH BLOOD COLLECTION ONLY

## 2018-11-01 LAB — URINALYSIS MICROSCOPIC REFLEX TO CULTURE

## 2018-11-01 LAB — MAGNESIUM: Lab: 1.5 mg/dL — ABNORMAL LOW (ref 1.6–2.6)

## 2018-11-01 LAB — PHOSPHORUS: Lab: 3.6 mg/dL — ABNORMAL HIGH (ref 2.0–4.5)

## 2018-11-01 LAB — PROTEIN/CR RATIO,UR RAN
Lab: 29 mg/dL — ABNORMAL LOW (ref 8.5–10.6)
Lab: 95 mg/dL (ref 6.0–8.0)

## 2018-11-01 MED FILL — SULFAMETHOXAZOLE-TRIMETHOPRIM 400-80 MG PO TAB: 80/400 mg | ORAL | 30 days supply | Qty: 30 | Fill #3 | Status: CP

## 2018-11-01 MED FILL — NIFEDIPINE 30 MG PO TR24: 30 mg | ORAL | 30 days supply | Qty: 30 | Fill #2 | Status: CP

## 2018-11-01 MED FILL — CARVEDILOL 25 MG PO TAB: 25 mg | ORAL | 30 days supply | Qty: 60 | Fill #2 | Status: CP

## 2018-11-01 NOTE — Patient Instructions
1. Watch your salt intake  2. Increase tacro to 2 mg twice a day  3. Monitor BP at home    RTC in 2 weeks

## 2018-11-02 ENCOUNTER — Encounter: Admit: 2018-11-02 | Discharge: 2018-11-02

## 2018-11-02 LAB — TACROLIMUS LC-MS/MS: Lab: 9.1

## 2018-11-02 MED ORDER — TACROLIMUS 1 MG PO CAP
2 mg | ORAL_CAPSULE | Freq: Two times a day (BID) | ORAL | 3 refills | 30.00000 days | Status: DC
Start: 2018-11-02 — End: 2018-11-16

## 2018-11-02 NOTE — Progress Notes
Office visit note from 11/01/18 reviewed.  Prograf level increased to 2 mg twice daily.  Order placed for 24-hour urine collection.

## 2018-11-07 ENCOUNTER — Encounter: Admit: 2018-11-07 | Discharge: 2018-11-07 | Payer: MEDICARE

## 2018-11-07 ENCOUNTER — Ambulatory Visit: Admit: 2018-11-07 | Discharge: 2018-11-08 | Payer: MEDICARE

## 2018-11-07 DIAGNOSIS — R9439 Abnormal result of other cardiovascular function study: Secondary | ICD-10-CM

## 2018-11-07 DIAGNOSIS — Z5181 Encounter for therapeutic drug level monitoring: Secondary | ICD-10-CM

## 2018-11-07 DIAGNOSIS — I10 Essential (primary) hypertension: Secondary | ICD-10-CM

## 2018-11-07 DIAGNOSIS — E785 Hyperlipidemia, unspecified: Secondary | ICD-10-CM

## 2018-11-07 DIAGNOSIS — N186 End stage renal disease: Principal | ICD-10-CM

## 2018-11-07 DIAGNOSIS — Z940 Kidney transplant status: Secondary | ICD-10-CM

## 2018-11-07 LAB — COMPREHENSIVE METABOLIC PANEL
Lab: 0.3 mg/dL (ref 0.2–1.2)
Lab: 140 mmol/L (ref 136–145)
Lab: 15 — ABNORMAL HIGH (ref 0–14)
Lab: 17 mmol/L — ABNORMAL LOW (ref 22–29)
Lab: 2.1 — ABNORMAL HIGH (ref 0.57–1.11)
Lab: 25 — AB (ref >59)
Lab: 28 — ABNORMAL HIGH (ref 9.8–20.1)
Lab: 7 U/L
Lab: 7 U/L (ref 0–55)
Lab: 9.2 mg/dL (ref 8.4–10.2)

## 2018-11-07 LAB — PROTEIN/CR RATIO,UR RAN
Lab: 0.2 mg/mg — ABNORMAL HIGH (ref <0.2)
Lab: 42 g/dL — ABNORMAL LOW (ref 47–110)

## 2018-11-07 LAB — CBC AND DIFF
Lab: 0
Lab: 0
Lab: 0.4
Lab: 0.5
Lab: 0.6
Lab: 1.1
Lab: 102 — ABNORMAL HIGH (ref 80.0–99.0)
Lab: 14 % — ABNORMAL HIGH (ref 11.5–14.5)
Lab: 147 uL
Lab: 20
Lab: 3.6
Lab: 31 % (ref 30.0–34.0)
Lab: 32 pg — ABNORMAL HIGH (ref 27.0–31.0)
Lab: 5.1 uL (ref 4.8–10.8)
Lab: 70
Lab: 8

## 2018-11-07 LAB — URIC ACID: Lab: 6.4 mg/dL — ABNORMAL HIGH (ref 2.6–6.0)

## 2018-11-07 LAB — URINALYSIS DIPSTICK REFLEX TO CULTURE

## 2018-11-07 LAB — MAGNESIUM: Lab: 1.6 mg/dL (ref 1.6–2.6)

## 2018-11-07 LAB — URINALYSIS MICROSCOPIC REFLEX TO CULTURE

## 2018-11-07 MED ORDER — DARBEPOETIN ALFA IN POLYSORBAT 40 MCG/0.4 ML IJ SYRG
40 ug | Freq: Once | SUBCUTANEOUS | 0 refills | Status: CP
Start: 2018-11-07 — End: ?
  Administered 2018-11-07: 15:00:00 40 ug via SUBCUTANEOUS

## 2018-11-07 MED ORDER — DARBEPOETIN ALFA IN POLYSORBAT 40 MCG/0.4 ML IJ SYRG
40 ug | Freq: Once | SUBCUTANEOUS | 0 refills | Status: CN
Start: 2018-11-07 — End: ?

## 2018-11-07 MED FILL — MYCOPHENOLATE SODIUM 180 MG PO TBEC: 180 mg | ORAL | 30 days supply | Qty: 240 | Fill #3 | Status: CP

## 2018-11-08 ENCOUNTER — Encounter: Admit: 2018-11-08 | Discharge: 2018-11-08 | Payer: MEDICARE

## 2018-11-08 DIAGNOSIS — D631 Anemia in chronic kidney disease: Secondary | ICD-10-CM

## 2018-11-08 DIAGNOSIS — N189 Chronic kidney disease, unspecified: Secondary | ICD-10-CM

## 2018-11-08 DIAGNOSIS — Z940 Kidney transplant status: Secondary | ICD-10-CM

## 2018-11-08 DIAGNOSIS — Z5181 Encounter for therapeutic drug level monitoring: Secondary | ICD-10-CM

## 2018-11-08 LAB — CBC AND DIFF
Lab: 0
Lab: 0
Lab: 0.4
Lab: 1.2
Lab: 2.8 g/dL — ABNORMAL LOW (ref 4.20–5.40)
Lab: 5.3 mg/dL (ref 8.5–10.6)

## 2018-11-08 LAB — URINALYSIS MICROSCOPIC REFLEX TO CULTURE

## 2018-11-08 LAB — URINALYSIS DIPSTICK REFLEX TO CULTURE
Lab: 1 U/L — ABNORMAL HIGH (ref 27.0–31.0)
Lab: NEGATIVE (ref 3–12)
Lab: NEGATIVE MMOL/L (ref 21–30)
Lab: NEGATIVE U/L — ABNORMAL HIGH (ref 11.5–14.5)
Lab: NEGATIVE mL/min (ref >60)
Lab: NEGATIVE mL/min (ref >60)

## 2018-11-08 LAB — TACROLIMUS IMMUNOASSAY (FK506): Lab: 10 ug/L (ref 5.0–20.0)

## 2018-11-08 NOTE — Telephone Encounter
Patient was a no show for ureteral stent removal 10/14/18.  Patient was rescheduled for 11/09/18 with [place] at 1130.  Patient and husband both notified and confirmed they will be there.

## 2018-11-09 ENCOUNTER — Encounter: Admit: 2018-11-09 | Discharge: 2018-11-09

## 2018-11-09 DIAGNOSIS — E785 Hyperlipidemia, unspecified: Secondary | ICD-10-CM

## 2018-11-09 DIAGNOSIS — Z940 Kidney transplant status: Secondary | ICD-10-CM

## 2018-11-09 DIAGNOSIS — I10 Essential (primary) hypertension: Secondary | ICD-10-CM

## 2018-11-09 DIAGNOSIS — R9439 Abnormal result of other cardiovascular function study: Secondary | ICD-10-CM

## 2018-11-09 DIAGNOSIS — Z5181 Encounter for therapeutic drug level monitoring: Secondary | ICD-10-CM

## 2018-11-09 DIAGNOSIS — N186 End stage renal disease: Secondary | ICD-10-CM

## 2018-11-09 LAB — PROTEIN/CR RATIO,UR RAN
Lab: 0.2 — ABNORMAL HIGH (ref <0.2)
Lab: 13
Lab: 62

## 2018-11-09 NOTE — Procedures
Encounter Date:  11/09/2018    Procedure: Flexible Cystoscopy & Stent Removal    Provider:  Ihor Gully, PA-C  Staff: Leonard Downing, MD.  Staff not present, but immediately available.    Anesthesia:  Intraurethral 2% Lidocaine Gel  Complications: None    Indication for procedure: 58 y.o. female w/ ESRD s/p kidney transplant on 09/01/2018.  Presents to clinic today for transplant kidney stent removal.  Procedure discussed w/ pt, including risks & complications.      Time out performed.  Reviewed chart & Brief op note.    After informed consent was obtained, she was prepped & draped in the usual fashion.  2% Lidocaine administered per urethra.  After adequeate time for anesthetic effect, the flexible cystoscope was gently advanced into the urethra under direct visualization.  Urethra: (-)lesions/ strictures.  Upon entering the bladder, the stent was visualized from the right lateral wall anastomosis site.  The stent was grasped using flexible forceps.  The stent & scope were withdrawn simulatanesously.  She tolerated the procedure well & left the room in a pleasant disposition.      A/P:  1. s/p Renal Transplant  - stent removed today w/o difficulty.  - continue abx, as directed by Transplant team.  - f/u w/ Nephrology & Renal Transplant Team, per their recommendations.  - f/u w/ Urology prn.    Orders Placed This Encounter    CYSTO W/STENT REMOVAL       Ihor Gully, PA-C  Urology

## 2018-11-10 ENCOUNTER — Ambulatory Visit: Admit: 2018-11-09 | Discharge: 2018-11-10

## 2018-11-10 ENCOUNTER — Encounter: Admit: 2018-11-10 | Discharge: 2018-11-10

## 2018-11-10 DIAGNOSIS — Z940 Kidney transplant status: Secondary | ICD-10-CM

## 2018-11-10 DIAGNOSIS — Z5181 Encounter for therapeutic drug level monitoring: Secondary | ICD-10-CM

## 2018-11-10 LAB — COMPREHENSIVE METABOLIC PANEL
Lab: 139 mmol/L (ref 136–145)
Lab: 2.4 mg/dL — ABNORMAL HIGH (ref 0.57–1.11)
Lab: 27 mg/dL — ABNORMAL HIGH (ref 9.8–20.1)
Lab: 4.1 mmol/L (ref 3.5–5.1)

## 2018-11-10 LAB — MAGNESIUM: Lab: 1.5 mg/dL — ABNORMAL LOW (ref 1.6–2.6)

## 2018-11-10 LAB — PHOSPHORUS: Lab: 3.2 — ABNORMAL HIGH (ref 0–14)

## 2018-11-10 LAB — URIC ACID: Lab: 7.1 mg/dL — ABNORMAL HIGH (ref 2.6–6.0)

## 2018-11-10 LAB — TACROLIMUS IMMUNOASSAY (FK506): Lab: 11

## 2018-11-14 ENCOUNTER — Encounter: Admit: 2018-11-14 | Discharge: 2018-11-14

## 2018-11-14 DIAGNOSIS — Z940 Kidney transplant status: Secondary | ICD-10-CM

## 2018-11-14 DIAGNOSIS — Z5181 Encounter for therapeutic drug level monitoring: Secondary | ICD-10-CM

## 2018-11-14 DIAGNOSIS — I10 Essential (primary) hypertension: Secondary | ICD-10-CM

## 2018-11-14 DIAGNOSIS — E785 Hyperlipidemia, unspecified: Secondary | ICD-10-CM

## 2018-11-14 DIAGNOSIS — N186 End stage renal disease: Secondary | ICD-10-CM

## 2018-11-14 DIAGNOSIS — R9439 Abnormal result of other cardiovascular function study: Secondary | ICD-10-CM

## 2018-11-14 LAB — COMPREHENSIVE METABOLIC PANEL
Lab: 18 — ABNORMAL LOW (ref >59)
Lab: 0.3 mg/dL (ref 0.2–1.2)
Lab: 11 U/L (ref 0–55)
Lab: 113 mmol/L — ABNORMAL HIGH (ref 98–107)
Lab: 126 K/UL — ABNORMAL HIGH (ref 70–105)
Lab: 139 mmol/L — ABNORMAL LOW (ref 136–145)
Lab: 17 pg — ABNORMAL HIGH (ref >60)
Lab: 2.8 mg/dL — ABNORMAL HIGH (ref 0.57–1.11)
Lab: 27 mg/dL — ABNORMAL HIGH (ref >60)
Lab: 4.1 g/dL
Lab: 6.6 g/dL (ref 6.4–8.3)
Lab: 76 U/L (ref 40–150)
Lab: 9
Lab: 9.3 mg/dL (ref 8.4–10.2)

## 2018-11-14 LAB — MAGNESIUM: Lab: 1.5 mg/dL — ABNORMAL LOW (ref 1.6–2.6)

## 2018-11-14 LAB — URINALYSIS MICROSCOPIC REFLEX TO CULTURE

## 2018-11-14 LAB — URINALYSIS DIPSTICK REFLEX TO CULTURE
Lab: 0.2
Lab: NEGATIVE
Lab: NEGATIVE
Lab: NEGATIVE
Lab: NEGATIVE
Lab: NEGATIVE
Lab: NEGATIVE

## 2018-11-14 LAB — CBC AND DIFF
Lab: 0
Lab: 0
Lab: 4.6 10*3/uL — ABNORMAL LOW (ref 4.8–10.8)

## 2018-11-14 LAB — URIC ACID: Lab: 7.8 mg/dL — ABNORMAL HIGH (ref 2.6–6.0)

## 2018-11-14 LAB — TACROLIMUS IMMUNOASSAY (FK506): Lab: 10 ug/L (ref 5.0–20.0)

## 2018-11-14 LAB — PROTEIN/CR RATIO,UR RAN: Lab: 0.1 mg/mg (ref <0.2)

## 2018-11-14 MED ORDER — DARBEPOETIN ALFA IN POLYSORBAT 40 MCG/0.4 ML IJ SYRG
40 ug | Freq: Once | SUBCUTANEOUS | 0 refills | Status: CN
Start: 2018-11-14 — End: ?

## 2018-11-14 MED ORDER — DARBEPOETIN ALFA IN POLYSORBAT 40 MCG/0.4 ML IJ SYRG
40 ug | Freq: Once | SUBCUTANEOUS | 0 refills | Status: CP
Start: 2018-11-14 — End: ?
  Administered 2018-11-14: 21:00:00 40 ug via SUBCUTANEOUS

## 2018-11-15 ENCOUNTER — Ambulatory Visit: Admit: 2018-11-15 | Discharge: 2018-11-15

## 2018-11-15 ENCOUNTER — Encounter: Admit: 2018-11-15 | Discharge: 2018-11-15

## 2018-11-15 ENCOUNTER — Ambulatory Visit: Admit: 2018-11-14 | Discharge: 2018-11-15

## 2018-11-15 ENCOUNTER — Ambulatory Visit: Admit: 2018-11-15 | Discharge: 2018-11-16

## 2018-11-15 DIAGNOSIS — B349 Viral infection, unspecified: Secondary | ICD-10-CM

## 2018-11-15 DIAGNOSIS — D899 Disorder involving the immune mechanism, unspecified: Secondary | ICD-10-CM

## 2018-11-15 DIAGNOSIS — I151 Hypertension secondary to other renal disorders: Secondary | ICD-10-CM

## 2018-11-15 DIAGNOSIS — D631 Anemia in chronic kidney disease: Secondary | ICD-10-CM

## 2018-11-15 DIAGNOSIS — N200 Calculus of kidney: Secondary | ICD-10-CM

## 2018-11-15 DIAGNOSIS — N186 End stage renal disease: Secondary | ICD-10-CM

## 2018-11-15 DIAGNOSIS — Z5181 Encounter for therapeutic drug level monitoring: Secondary | ICD-10-CM

## 2018-11-15 DIAGNOSIS — Z940 Kidney transplant status: Secondary | ICD-10-CM

## 2018-11-15 DIAGNOSIS — N2889 Other specified disorders of kidney and ureter: Secondary | ICD-10-CM

## 2018-11-15 DIAGNOSIS — R9439 Abnormal result of other cardiovascular function study: Secondary | ICD-10-CM

## 2018-11-15 DIAGNOSIS — E785 Hyperlipidemia, unspecified: Secondary | ICD-10-CM

## 2018-11-15 DIAGNOSIS — I10 Essential (primary) hypertension: Secondary | ICD-10-CM

## 2018-11-15 LAB — CBC AND DIFF
Lab: 0 % — ABNORMAL LOW (ref >60)
Lab: 0 10*3/uL (ref 0–0.20)
Lab: 0 10*3/uL (ref 0–0.45)
Lab: 0.4 10*3/uL (ref 0–0.80)
Lab: 0.9 10*3/uL — ABNORMAL LOW (ref 1.0–4.8)
Lab: 1 % — ABNORMAL LOW (ref >60)
Lab: 129 10*3/uL — ABNORMAL LOW (ref 150–400)
Lab: 14 % (ref 11–15)
Lab: 16 % — ABNORMAL LOW (ref 24–44)
Lab: 2.8 M/UL — ABNORMAL LOW (ref 4.0–5.0)
Lab: 28 % — ABNORMAL LOW (ref 36–45)
Lab: 32 g/dL (ref 32.0–36.0)
Lab: 32 pg (ref 26–34)
Lab: 4.1 10*3/uL (ref 1.8–7.0)
Lab: 5.5 10*3/uL — ABNORMAL HIGH (ref 4.5–11.0)
Lab: 7.8 FL (ref 7–11)
Lab: 75 % — ABNORMAL LOW (ref 41–77)
Lab: 8 % (ref 4–12)

## 2018-11-15 LAB — URINALYSIS DIPSTICK REFLEX TO CULTURE
Lab: NEGATIVE
Lab: NEGATIVE
Lab: NEGATIVE
Lab: NEGATIVE
Lab: NEGATIVE
Lab: NEGATIVE

## 2018-11-15 LAB — PROTEIN/CR RATIO,UR RAN
Lab: 135 mg/dL (ref 5.0–8.0)
Lab: 39 mg/dL (ref 1.003–1.035)
Lab: 0.3 — AB

## 2018-11-15 LAB — URIC ACID: Lab: 7.9 mg/dL — ABNORMAL HIGH (ref 2.0–7.0)

## 2018-11-15 LAB — PHOSPHORUS: Lab: 3.9 mg/dL (ref 2.0–4.5)

## 2018-11-15 LAB — COMPREHENSIVE METABOLIC PANEL
Lab: 138 MMOL/L (ref 137–147)
Lab: 4.2 MMOL/L (ref 3.5–5.1)

## 2018-11-15 LAB — MAGNESIUM: Lab: 1.4 mg/dL — ABNORMAL LOW (ref 1.6–2.6)

## 2018-11-15 LAB — TACROLIMUS LC-MS/MS: Lab: 19 g/dL — ABNORMAL LOW (ref 12.0–15.0)

## 2018-11-15 LAB — URINALYSIS MICROSCOPIC REFLEX TO CULTURE

## 2018-11-15 MED ORDER — MYCOPHENOLATE SODIUM 180 MG PO TBEC
540 mg | ORAL_TABLET | Freq: Two times a day (BID) | ORAL | 3 refills | Status: DC
Start: 2018-11-15 — End: 2018-11-29

## 2018-11-15 MED ORDER — NIFEDIPINE 30 MG PO TR24
60 mg | ORAL_TABLET | Freq: Every day | ORAL | 3 refills | 30.00000 days | Status: DC
Start: 2018-11-15 — End: 2019-05-01
  Filled 2018-11-15: qty 180, 90d supply, fill #1

## 2018-11-15 NOTE — Progress Notes
Transplant Nephrology Clinic Progress Note    Date of Service: 11/15/2018 5:42 PM       8/25/2020[name]  1960-06-11  [id number]    Michele Benitez is a 58 y.o. 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 She has been on dialysis since August 28, 2014  ???  TRANSPLANT SYNOPSIS:  Date:09/01/2018  Transplant Type: DCD from a female in her 90's  KDPI: 75%  CPRA: 0%/7%  DSA: none  Induction: Thymo  CMV status: D+/R+  Post Op Course: c/b mild injury to renal artery with anastomosis; started on ASA post op c/b RAS s/p angioplasty on 10/24/18  Baseline Scr: 2.3  Clinical trial: none  Referring nephrologist:  Stent removal: 11/09/18    History of Present Illness  Since her last visit on 11/01/18, she had angioplasty on 10/24/18 for transplant RAS.   BP continue to be elevated 130-145/80-90s. She is taking Procardia 30 mg daily in the middle of the day and Coreg 25 mg in morning and night.  She continues to eat out. Reports overall intake is only 1 to 2 meals daily. Reports intermittent snacking throughout the day.   Makes about a liter of urine per day. No dysuria. No fevers. No diarrhea. Has been getting aranesp shots.       Review of Systems   Constitutional: Negative.    HENT: Negative.    Eyes: Negative.    Respiratory: Negative.    Cardiovascular: Negative.    Gastrointestinal: Negative.    Genitourinary: Negative.    Musculoskeletal: Negative.    Skin: Negative.    Neurological: Negative.    Endo/Heme/Allergies: Negative.    Psychiatric/Behavioral: Negative.          MEDS:  ??? acetaminophen (TYLENOL) 500 mg tablet Take 500 mg by mouth every 6 hours as needed for Pain. Max of 4,000 mg of acetaminophen in 24 hours.   ??? aspirin EC 81 mg tablet Do not take until directed by transplant clinic   ??? carvediloL (COREG) 25 mg tablet Take one tablet by mouth twice daily.   ??? mycophenolate DR (MYFORTIC) 180 mg TbEC tablet Take three tablets by mouth twice daily.   ??? NIFEdipine XL (PROCARDIA-XL) 30 mg tablet Take two tablets by mouth daily. ??? pantoprazole DR (PROTONIX) 40 mg tablet Take one tablet by mouth twice daily.   ??? predniSONE (DELTASONE) 5 mg tablet Take one tablet by mouth daily with breakfast.   ??? simvastatin (ZOCOR) 20 mg tablet Take 20 mg by mouth at bedtime daily.   ??? tacrolimus (PROGRAF) 1 mg capsule Take two capsules by mouth twice daily.   ??? trimethoprim/sulfamethoxazole (BACTRIM) 80/400 mg tablet Take one tablet by mouth daily.   ??? valGANciclovir (VALCYTE) 450 mg tablet Take one tablet by mouth twice weekly. On Monday and Thursday       No Known Allergies    PMHx:  HPL  HTN  Cardiac cath in Sept 2017 with minimal plaque of RCA and LAD  ???  PSHx:  RUE wrist fistula???s/p ligation on 04/20/2016  LUE AV fistula???11/29/2015    SHx:  Marital status: married  Children: 2 children  Education:  Occupation:  Smoking: quit 58 yo  Blood transfusions: none  ???  FHx:  Cancer: no breast cancer; no colon cancer  Diabetes: grandfather; brother    Objective:           Vitals:    11/15/18 0906   BP: 134/67   BP Source: Arm, Right Upper  Patient Position: Standing   Pulse: 69   Temp: 36.4 ???C (97.6 ???F)   TempSrc: Oral   SpO2: 100%   Weight: 75.7 kg (166 lb 12.8 oz)   Height: 167.6 cm (66)   PainSc: Zero     Body mass index is 26.92 kg/m???.     Physical Exam:  General: in NAD  Skin: warm, dry; mild erythema around wound  HEENT: glasses; EOMI  CV: RRR S1/S2, soft systolic murmur  Lungs: no rales  Abd: RLQ allograft healing well  Ext: no edema  Neuro: nonfocal  Psych: affect appropriate      Labs and Diagnostic Tests:  Urinalysis:  Lab Results   Component Value Date/Time    UCOLOR YELLOW 11/15/2018 09:01 AM    TURBID CLEAR 11/15/2018 09:01 AM    USPGR 1.014 11/15/2018 09:01 AM    UPH 6.0 11/15/2018 09:01 AM    UPROTEIN 1+ (A) 11/15/2018 09:01 AM    UAGLU NEG 11/15/2018 09:01 AM    UKET NEG 11/15/2018 09:01 AM    UBILE NEG 11/15/2018 09:01 AM    UBLD NEG 11/15/2018 09:01 AM    UROB NORMAL 11/15/2018 09:01 AM       CBC with Diff: CBC with Diff Latest Ref Rng & Units 11/15/2018 11/10/2018   WBC 4.5 - 11.0 K/UL 5.5 4.6(L)   RBC 4.0 - 5.0 M/UL 2.82(L) 2.93(L)   HGB 12.0 - 15.0 GM/DL 1.6(X) 0.9(U)   HCT 36 - 45 % 28.1(L) 30.4(L)   MCV 80 - 100 FL 99.8 104.0(H)   MCH 26 - 34 PG 32.2 31.3(H)   MCHC 32.0 - 36.0 G/DL 04.5 40.9   RDW 11 - 15 % 14.8 14.9(H)   PLT 150 - 400 K/UL 129(L) 146   MPV 7 - 11 FL 7.8 -   NEUT 41 - 77 % 75 70.0   ANC 1.8 - 7.0 K/UL 4.14 3.2   LYMA 24 - 44 % 16(L) -   ALYM 1.0 - 4.8 K/UL 0.91(L) 0.9   MONA 4 - 12 % 8 -   AMONO 0 - 0.80 K/UL 0.43 0.4   EOSA 0 - 5 % 0 -   AEOS 0 - 0.45 K/UL 0.02 0.0   BASA 0 - 2 % 1 -   ABAS 0 - 0.20 K/UL 0.04 0.0       CMP:  CMP Latest Ref Rng & Units 11/15/2018 11/10/2018   NA 137 - 147 MMOL/L 138 139   K 3.5 - 5.1 MMOL/L 4.2 4.4   CL 98 - 110 MMOL/L 112(H) 113(H)   CO2 21 - 30 MMOL/L 14(L) 13.0(L)   GAP 3 - 12 12 17(H)   BUN 7 - 25 MG/DL 81(X) 27.0(H)   CR 0.4 - 1.00 MG/DL 9.14(N) 8.29(F)   GLUX 70 - 100 MG/DL 621(H) -   CA 8.5 - 08.6 MG/DL 9.4 9.3   TP 6.0 - 8.0 G/DL 6.5 6.6   ALB 3.5 - 5.0 G/DL 4.3 4.1   ALKP 25 - 578 U/L 68 76   ALT 7 - 56 U/L 7 11   TBILI 0.3 - 1.2 MG/DL 0.3 4.69   GFR >62 mL/min 17(L) 18.5(L)   GFRAA >60 mL/min 21(L) -       No results found for: CMVDNAPCR  BK Virus Plasma Quant (no units)   Date Value   10/18/2018     NOT DETECTED  Reference range: NOT DETECTED  Unit: copies/mL  .  Assay Range: 39  copies/mL to 1.00E+10 copies/mL  .  The limit of quantitation (LOQ) is 39 copies/mL. BK virus DNA  detected below the  LOQ will be reported as Detected:<39 copies/mL.  .  This test was developed and its performance characteristics  determined by  [employer]. It has not been cleared or approved by the [place]  Food and Drug  Administration. Results should be used in conjunction with clinical  findings,  and should not form the sole basis for a diagnosis or treatment  decision.  ____________________________________________________________  Testing Performed At:  [facility] [address]  [place], [place] [address]  [phone]  CLIA ID: [id number]     10/04/2018     NOT DETECTED  Reference range: NOT DETECTED  Unit: copies/mL  .  Assay Range: 39 copies/mL to 1.00E+10 copies/mL  .  The limit of quantitation (LOQ) is 39 copies/mL. BK virus DNA  detected below the  LOQ will be reported as Detected:<39 copies/mL.  .  This test was developed and its performance characteristics  determined by  [employer] [employer]. It has not been cleared or approved by the U.S.  Food and Drug  Administration. Results should be used in conjunction with clinical  findings,  and should not form the sole basis for a diagnosis or treatment  decision.  ____________________________________________________________  Testing Performed At:  [facility]  [address]  [place], [place] [address]  [phone]  CLIA ID: [id number]       No results found for: [facility]    Other Common Labs:  Other Common Labs Latest Ref Rng & Units 11/15/2018 11/10/2018   IRON 50 - 160 MCG/DL - -   TIBC 854 - 627 MCG/DL - -   PSAT 28 - 42 % - -   FER 10 - 200 NG/ML - -   PO4 2.0 - 4.5 MG/DL 3.9 -   MG 1.6 - 2.6 mg/dL 0.3(J) 0.0(X)   UPRO NEG-NEG 1+(A) Negative   UCRR MG/DL 381 82.99   UPCRC - 0.3 0.11694   URICA 2.0 - 7.0 MG/DL 7.9(H) 7.8(H)   HBA1C 4.0 - 6.0 % - -   Tacrolimus 5.0 - 20.0 mcg/L - 10.7         Tacrolimus LC-MS/MS (no units)   Date Value   11/15/2018 19.0   11/01/2018 9.1   10/25/2018 6.9   10/18/2018 16.9   10/04/2018 11.0       Imaging: Renal USN (10/25/18)    The right lower quad transplant kidney measures 11.1 cm in length. ???There   is mild renal pelviectasis without frank hydronephrosis. Nephroureteral   stent is in place. Multiple renal calculi are seen in the mid and lower   transplant kidney. The intrarenal resistive indices range from 0.66-0.70.   The main renal artery and vein appear patent. Maximum main renal artery   velocity is 177 cm/s, previously reaching 420 cm/s at the anastomosis.. The main renal artery appears tortuous.     The right iliac artery and vein appear patent. The maximum iliac artery   velocity is 112 cm/s.    There is a perinephric fluid collection or possible seroma along the   medial and inferior aspect of the transplant kidney measuring 4.2 x 4.2 x   7.3 cm. This compares to 5.5 x 2.6 x 8.2 cm on the previous study.   Additional subincisional fluid collection is seen measuring 12.1 x 2.7 cm,   previously 11.8 x 3.5 cm.  The bladder is decompressed.    Renal USN (11/15/2018):  1. ???Mild dilatation of the renal collecting system following   nephroureteral stent removal. This may be due to mild ureteral stricture   or possibly recent/occult ureteral calculus. If the patient is managed   conservatively, recommend repeat renal ultrasound in 1-2 days to evaluate   for persistence or resolution.   2. ???Patent renal vasculature. No evidence of large vessel compromise of   the graft. Decrease in size of perinephric and subincisional fluid   collections.   3. ???Nephrolithiasis.     Assessment and Plan:    Michele Benitez is a 58 y.o. 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  ???  1. Immunosuppression: on tacro/MPA/prednisone; aiming for tacro levels between 8-12 ng/ml by LCMS or 10-15 ng/ml per MEIA. Prior MEIA level at goal. level today above goal so will decrease tacro to 1 mg po bid. Decreasing MPA to 540 mg BID due to diarrhea. Continue Prednisone 5 mg daily.   2. Renal function:  Scr elevated 2.81 mg/dl following following stent removal on 8/19; UA fairly bland. Repeat Scr still at 2.8; if elevated will likely need an renal US to rule out hydronephrosis ( see above - mild hydro; not impressive:  24 hr urine for [facility] sent (pending result)  3. HTN: BP not ideal on Coreg but consuming a lot of NaCl; discussed sodium intake; Will increase Nifedipine XL to 60 mg daily.  4. Infection: will need 3 months of Valcyte and 12 months of Bactrim for CMV and PCP/UTI prophylaxis respectively  5. Anemia: unchanged; iron stores good; Fe 61 on 6/13; continue Aranesp  6. Hyperuricemia: watch for now; UA should improve as renal function improves  7. CAD: mild; continue on asa, Zocor and Coreg    Terance Hart MD  Nephrology fellow    I have personally seen and examined this patient with the fellow, performed the key portions of the E/M visit, discussed the case and concur with the fellow's documentation of history, physical exam, assessment, and treatment plan. Use, side effects and level of immunosuppressive meds reviewed and discussed with the patient and team.    Rosaland Lao, MD      Cc: [name]  CC: [place], [place] is appropriate for kidney transplant nursing management of immunosuppression medications and laboratory values. The goal is to maintain a therapeutic effect and to decrease the chance of medication nephrotoxicity, rejection and infection while maintaining good allograft function.

## 2018-11-15 NOTE — Patient Instructions
Decrease Myfortic to 3 tablets twice daily  Increase Procardia to 2 tablets daily

## 2018-11-16 ENCOUNTER — Encounter: Admit: 2018-11-16 | Discharge: 2018-11-16

## 2018-11-16 MED ORDER — TACROLIMUS 1 MG PO CAP
1 mg | ORAL_CAPSULE | Freq: Two times a day (BID) | ORAL | 3 refills | 30.00000 days | Status: DC
Start: 2018-11-16 — End: 2018-12-28

## 2018-11-16 NOTE — Progress Notes
Office visit note from 11/15/18 reviewed.  Prograf decreased from 2 mg bid to 1 mg bid for level of 19.0.  Myfortic decreased during clinic visit to 540 mg bid for diarrhea.  Ultrasound done after clinic visit reviewed by Dr. Abram Sander and shows no hydronephrosis.  Nifedipine increased to 60 mg daily.

## 2018-11-16 NOTE — Telephone Encounter
Prograf level above goal at 19.0  Per Dr. Abram Sander, patient to decrease dose from 2 mg bid to 1 mg bid.  [facility] notified and repeated dose adjustment.

## 2018-11-17 LAB — BK VIRUS DNA, QUANT PLASMA

## 2018-11-18 ENCOUNTER — Encounter: Admit: 2018-11-18 | Discharge: 2018-11-18

## 2018-11-18 DIAGNOSIS — Z5181 Encounter for therapeutic drug level monitoring: Secondary | ICD-10-CM

## 2018-11-18 DIAGNOSIS — Z940 Kidney transplant status: Secondary | ICD-10-CM

## 2018-11-18 LAB — URINALYSIS MICROSCOPIC REFLEX TO CULTURE

## 2018-11-18 LAB — URINALYSIS DIPSTICK REFLEX TO CULTURE
Lab: NEGATIVE
Lab: 0.2
Lab: 1 pg — ABNORMAL HIGH (ref 1.002–1.030)
Lab: 6 — ABNORMAL HIGH (ref 80.0–99.0)
Lab: NEGATIVE
Lab: NEGATIVE % (ref 30.0–34.0)
Lab: NEGATIVE — ABNORMAL LOW (ref 18.0–47.0)

## 2018-11-18 LAB — COMPREHENSIVE METABOLIC PANEL
Lab: 136 mmol/L (ref 136–145)
Lab: 14 mmol/L — ABNORMAL LOW (ref 22–29)
Lab: 15 K/UL — ABNORMAL HIGH (ref 0–14)
Lab: 2.8 mg/dL — ABNORMAL HIGH (ref <20.7)

## 2018-11-18 LAB — PROTEIN/CR RATIO,UR RAN: Lab: 0.1 — ABNORMAL LOW (ref <0.2)

## 2018-11-18 LAB — CBC AND DIFF
Lab: 0
Lab: 6.4 10*3/uL (ref 4.8–10.8)

## 2018-11-18 LAB — URIC ACID: Lab: 7.9 mg/dL — ABNORMAL HIGH (ref 2.6–6.0)

## 2018-11-18 LAB — MAGNESIUM: Lab: 1.5 mg/dL — ABNORMAL LOW (ref 1.6–2.6)

## 2018-11-21 ENCOUNTER — Encounter: Admit: 2018-11-21 | Discharge: 2018-11-21

## 2018-11-21 DIAGNOSIS — E785 Hyperlipidemia, unspecified: Secondary | ICD-10-CM

## 2018-11-21 DIAGNOSIS — Z5181 Encounter for therapeutic drug level monitoring: Secondary | ICD-10-CM

## 2018-11-21 DIAGNOSIS — R9439 Abnormal result of other cardiovascular function study: Secondary | ICD-10-CM

## 2018-11-21 DIAGNOSIS — I10 Essential (primary) hypertension: Secondary | ICD-10-CM

## 2018-11-21 DIAGNOSIS — Z940 Kidney transplant status: Secondary | ICD-10-CM

## 2018-11-21 DIAGNOSIS — N186 End stage renal disease: Secondary | ICD-10-CM

## 2018-11-21 LAB — TACROLIMUS IMMUNOASSAY (FK506): Lab: 15

## 2018-11-21 MED ORDER — DARBEPOETIN ALFA IN POLYSORBAT 40 MCG/0.4 ML IJ SYRG
40 ug | Freq: Once | SUBCUTANEOUS | 0 refills | Status: CN
Start: 2018-11-21 — End: ?

## 2018-11-21 MED ORDER — DARBEPOETIN ALFA IN POLYSORBAT 40 MCG/0.4 ML IJ SYRG
40 ug | Freq: Once | SUBCUTANEOUS | 0 refills | Status: CP
Start: 2018-11-21 — End: ?
  Administered 2018-11-21: 21:00:00 40 ug via SUBCUTANEOUS

## 2018-11-21 MED FILL — PREDNISONE 5 MG PO TAB: 5 mg | ORAL | 30 days supply | Qty: 90 | Fill #3 | Status: CP

## 2018-11-21 NOTE — Patient Instructions
Post infusion emergency medical treatment: Go to the closest Emergency Department or call 911. For non-urgent questions or concerns, call [phone] after 0900 the following day(including weekends & holidays). For urgent medical questions, call [phone] and ask to speak to the On-Call Physician covering for the physician prescribing your infusion medication.

## 2018-11-21 NOTE — Progress Notes
Patient tolerated injection, no reaction noted.

## 2018-11-22 ENCOUNTER — Ambulatory Visit: Admit: 2018-11-21 | Discharge: 2018-11-22

## 2018-11-22 ENCOUNTER — Encounter: Admit: 2018-11-22 | Discharge: 2018-11-22

## 2018-11-22 DIAGNOSIS — D631 Anemia in chronic kidney disease: Secondary | ICD-10-CM

## 2018-11-22 DIAGNOSIS — N186 End stage renal disease: Secondary | ICD-10-CM

## 2018-11-23 ENCOUNTER — Encounter: Admit: 2018-11-23 | Discharge: 2018-11-23

## 2018-11-23 NOTE — Progress Notes
[facility] results emailed to Dr. Abram Sander from 11/15/18.

## 2018-11-24 ENCOUNTER — Encounter: Admit: 2018-11-24 | Discharge: 2018-11-24

## 2018-11-24 MED ORDER — POTASSIUM CHLORIDE 20 MEQ PO TBTQ
20 meq | ORAL_TABLET | Freq: Two times a day (BID) | ORAL | 3 refills | Status: CN
Start: 2018-11-24 — End: ?

## 2018-11-24 NOTE — Telephone Encounter
Patient's [facility] reviewed by Dr. Abram Sander.  Patient asked to  to drink more fluid and asked to start K+ citrate 20 meq bid.  Patient notified.

## 2018-11-25 ENCOUNTER — Encounter: Admit: 2018-11-25 | Discharge: 2018-11-25

## 2018-11-25 DIAGNOSIS — Z006 Encounter for examination for normal comparison and control in clinical research program: Secondary | ICD-10-CM

## 2018-11-25 DIAGNOSIS — Z940 Kidney transplant status: Secondary | ICD-10-CM

## 2018-11-25 DIAGNOSIS — Z5181 Encounter for therapeutic drug level monitoring: Secondary | ICD-10-CM

## 2018-11-25 LAB — TACROLIMUS IMMUNOASSAY (FK506): Lab: 9.6

## 2018-11-25 MED ORDER — POTASSIUM CITRATE 10 MEQ (1,080 MG) PO TBER
20 meq | ORAL_TABLET | Freq: Two times a day (BID) | ORAL | 3 refills | 30.00000 days | Status: DC
Start: 2018-11-25 — End: 2019-05-01

## 2018-11-29 ENCOUNTER — Ambulatory Visit: Admit: 2018-11-29 | Discharge: 2018-11-29

## 2018-11-29 ENCOUNTER — Encounter: Admit: 2018-11-29 | Discharge: 2018-11-29

## 2018-11-29 ENCOUNTER — Ambulatory Visit: Admit: 2018-11-29 | Discharge: 2018-11-30

## 2018-11-29 DIAGNOSIS — I151 Hypertension secondary to other renal disorders: Secondary | ICD-10-CM

## 2018-11-29 DIAGNOSIS — Z006 Encounter for examination for normal comparison and control in clinical research program: Secondary | ICD-10-CM

## 2018-11-29 DIAGNOSIS — I10 Essential (primary) hypertension: Secondary | ICD-10-CM

## 2018-11-29 DIAGNOSIS — Z940 Kidney transplant status: Secondary | ICD-10-CM

## 2018-11-29 DIAGNOSIS — E785 Hyperlipidemia, unspecified: Secondary | ICD-10-CM

## 2018-11-29 DIAGNOSIS — N186 End stage renal disease: Secondary | ICD-10-CM

## 2018-11-29 DIAGNOSIS — D631 Anemia in chronic kidney disease: Secondary | ICD-10-CM

## 2018-11-29 DIAGNOSIS — R9439 Abnormal result of other cardiovascular function study: Secondary | ICD-10-CM

## 2018-11-29 DIAGNOSIS — N200 Calculus of kidney: Secondary | ICD-10-CM

## 2018-11-29 DIAGNOSIS — Z5181 Encounter for therapeutic drug level monitoring: Secondary | ICD-10-CM

## 2018-11-29 DIAGNOSIS — N2889 Other specified disorders of kidney and ureter: Secondary | ICD-10-CM

## 2018-11-29 DIAGNOSIS — D899 Disorder involving the immune mechanism, unspecified: Secondary | ICD-10-CM

## 2018-11-29 LAB — COMPREHENSIVE METABOLIC PANEL
Lab: 10
Lab: 148
Lab: 23 — ABNORMAL LOW (ref >59)
Lab: 0.3 mg/dL (ref 0.3–1.2)
Lab: 10 10*3/uL — ABNORMAL LOW (ref 3–12)
Lab: 107 MMOL/L (ref 98–110)
Lab: 12 U/L (ref 7–40)
Lab: 137 MMOL/L — ABNORMAL LOW (ref 137–147)
Lab: 20 MMOL/L — ABNORMAL LOW (ref 21–30)
Lab: 23 mL/min — ABNORMAL LOW (ref >60)
Lab: 23 mg/dL (ref 7–25)
Lab: 27 mL/min — ABNORMAL LOW (ref >60)
Lab: 4.1 MMOL/L — ABNORMAL LOW (ref 3.5–5.1)
Lab: 6.4 g/dL (ref 6.0–8.0)
Lab: 66 U/L (ref 25–110)
Lab: 9 U/L (ref 7–56)
Lab: 9.3 mg/dL (ref 8.5–10.6)
Lab: 0.1 mg/dL — ABNORMAL LOW (ref 0.2–1.2)
Lab: 135 mmol/L — ABNORMAL LOW (ref 136–145)
Lab: 16 mmol/L — ABNORMAL LOW (ref 22–29)
Lab: 2.3 mg/dL — ABNORMAL HIGH (ref 0.57–1.11)
Lab: 22 mg/dL — ABNORMAL HIGH (ref 9.8–20.1)
Lab: 3.9 g/dL
Lab: 6.1 g/dL — ABNORMAL LOW (ref 6.4–8.3)
Lab: 73 U/L
Lab: 8.8 mg/dL
Lab: 9 U/L

## 2018-11-29 LAB — MAGNESIUM
Lab: 1.4 mg/dL — ABNORMAL LOW (ref 1.6–2.6)
Lab: 1.5 mmol/L — ABNORMAL LOW (ref 1.6–2.6)

## 2018-11-29 LAB — TACROLIMUS IMMUNOASSAY (FK506): Lab: 10

## 2018-11-29 LAB — URIC ACID
Lab: 6.1 mg/dL (ref 2.0–7.0)
Lab: 6.7 — ABNORMAL HIGH (ref 2.6–6.0)

## 2018-11-29 LAB — CBC AND DIFF
Lab: 0
Lab: 0
Lab: 0.3
Lab: 0.8
Lab: 0.8
Lab: 0.9
Lab: 3.6
Lab: 0 10*3/uL (ref 0–0.20)
Lab: 2.8 M/UL — ABNORMAL LOW (ref 4.0–5.0)
Lab: 3.7 10*3/uL — ABNORMAL LOW (ref 4.5–11.0)
Lab: 104 mg/dL — ABNORMAL HIGH (ref 80.0–99.0)
Lab: 127 U/L — ABNORMAL LOW (ref 130–400)
Lab: 14 MMOL/L (ref 21–30)
Lab: 18 mL/min — ABNORMAL LOW (ref >60)
Lab: 29 U/L — ABNORMAL LOW (ref 30.0–34.0)
Lab: 3 mg/dL — ABNORMAL LOW (ref >40)
Lab: 31 U/L — ABNORMAL HIGH (ref 27.0–31.0)
Lab: 31 mg/dL — ABNORMAL LOW (ref 37.0–47.0)
Lab: 4.9 mg/dL (ref <150)
Lab: 7 mL/min (ref >60)
Lab: 73 (ref 3–12)
Lab: 9.5 mg/dL — ABNORMAL LOW (ref <100)

## 2018-11-29 LAB — URINALYSIS DIPSTICK REFLEX TO CULTURE
Lab: 0.2
Lab: NEGATIVE
Lab: NEGATIVE
Lab: NEGATIVE
Lab: NEGATIVE

## 2018-11-29 LAB — URINALYSIS MICROSCOPIC REFLEX TO CULTURE

## 2018-11-29 LAB — RESEARCH BLOOD COLLECTION ONLY

## 2018-11-29 LAB — PROTEIN/CR RATIO,UR RAN
Lab: 88
Lab: 121 mg/dL (ref 5.0–8.0)
Lab: 26 mg/dL (ref 1.003–1.035)

## 2018-11-29 LAB — PHOSPHORUS: Lab: 2.7 mg/dL — ABNORMAL HIGH (ref 2.0–4.5)

## 2018-11-29 MED ORDER — MYCOPHENOLATE SODIUM 180 MG PO TBEC
540 mg | ORAL_TABLET | Freq: Two times a day (BID) | ORAL | 3 refills | Status: DC
Start: 2018-11-29 — End: 2018-12-30
  Filled 2018-12-16: qty 540, 30d supply, fill #1

## 2018-11-29 MED ORDER — DARBEPOETIN ALFA IN POLYSORBAT 40 MCG/0.4 ML IJ SYRG
40 ug | Freq: Once | SUBCUTANEOUS | 0 refills | Status: CN
Start: 2018-11-29 — End: ?

## 2018-11-29 MED ORDER — DARBEPOETIN ALFA IN POLYSORBAT 40 MCG/0.4 ML IJ SYRG
40 ug | Freq: Once | SUBCUTANEOUS | 0 refills | Status: CP
Start: 2018-11-29 — End: ?
  Administered 2018-11-29: 19:00:00 40 ug via SUBCUTANEOUS

## 2018-11-29 MED FILL — CARVEDILOL 25 MG PO TAB: 25 mg | ORAL | 30 days supply | Qty: 60 | Fill #3 | Status: CP

## 2018-11-29 NOTE — Progress Notes
Transplant Nephrology Clinic Progress Note    Date of Service: 11/29/2018 5:36 PM       9/8/2020[name]  08-Nov-1960  [id number]    Michele Benitez is a 58 y.o. 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 She has been on dialysis since August 28, 2014  ???  TRANSPLANT SYNOPSIS:  Date:09/01/2018  Transplant Type: DCD from a female in her 68's  KDPI: 75%  CPRA: 0%/7%  DSA: none  Induction: Thymo  CMV status: D+/R+  Post Op Course: c/b mild injury to renal artery with anastomosis; started on ASA post op c/b RAS s/p angioplasty on 10/24/18  Baseline Scr: 2.3  Clinical trial: none  Referring nephrologist:  Stent removal: 11/09/18    History of Present Illness  Since her last visit on 11/15/18 she reports doing well. No hospitalizations or ED visits.  BP improved to 120-130/70s. She is taking Procardia 60 mg daily and Coreg 25 mg in morning and night.  Diet has improved. Reports eating out less but still doing so frequently.    Makes about a liter of urine per day. No dysuria. No fevers. No diarrhea. Has been getting aranesp shots.   Has been started on KCitrate given abnormal Litholink and stone in allograft.     Review of Systems   Constitutional: Negative.    HENT: Negative.    Eyes: Negative.    Respiratory: Negative.    Cardiovascular: Negative.    Gastrointestinal: Negative.    Genitourinary: Negative.    Musculoskeletal: Negative.    Skin: Negative.    Neurological: Negative.    Endo/Heme/Allergies: Negative.    Psychiatric/Behavioral: Negative.          MEDS:  ??? acetaminophen (TYLENOL) 500 mg tablet Take 500 mg by mouth every 6 hours as needed for Pain. Max of 4,000 mg of acetaminophen in 24 hours.   ??? aspirin EC 81 mg tablet Do not take until directed by transplant clinic   ??? carvediloL (COREG) 25 mg tablet Take one tablet by mouth twice daily.   ??? mycophenolate DR (MYFORTIC) 180 mg TbEC tablet Take three tablets by mouth twice daily.   ??? NIFEdipine XL (PROCARDIA-XL) 30 mg tablet Take two tablets by mouth daily. ??? pantoprazole DR (PROTONIX) 40 mg tablet Take one tablet by mouth twice daily.   ??? potassium citrate (UROCIT-K) 10 mEq (1,080 mg) tablet Take two tablets by mouth twice daily. Take with food.   ??? predniSONE (DELTASONE) 5 mg tablet Take one tablet by mouth daily with breakfast.   ??? simvastatin (ZOCOR) 20 mg tablet Take 20 mg by mouth at bedtime daily.   ??? tacrolimus (PROGRAF) 1 mg capsule Take one capsule by mouth twice daily.   ??? trimethoprim/sulfamethoxazole (BACTRIM) 80/400 mg tablet Take one tablet by mouth daily.   ??? valGANciclovir (VALCYTE) 450 mg tablet Take one tablet by mouth twice weekly. On Monday and Thursday       No Known Allergies    PMHx:  HPL  HTN  Cardiac cath in Sept 2017 with minimal plaque of RCA and LAD  ???  PSHx:  RUE wrist fistula???s/p ligation on 04/20/2016  LUE AV fistula???11/29/2015    SHx:  Marital status: married  Children: 2 children  Education:  Occupation:  Smoking: quit 58 yo  Blood transfusions: none  ???  FHx:  Cancer: no breast cancer; no colon cancer  Diabetes: grandfather; brother    Objective:           Vitals:  11/29/18 0928   BP: (!) 146/75   BP Source: Arm, Right Upper   Patient Position: Sitting   Pulse: 78   Temp: 36.8 ???C (98.2 ???F)   TempSrc: Oral   SpO2: 99%   Weight: 77.4 kg (170 lb 9.6 oz)   Height: 167.6 cm (66)   PainSc: Zero     Body mass index is 27.54 kg/m???.     Physical Exam:  General: in NAD  Skin: warm, dry; mild erythema around wound  HEENT: glasses; EOMI  CV: RRR S1/S2, soft systolic murmur  Lungs: no rales  Abd: RLQ allograft healing well  Ext: no edema  Neuro: nonfocal  Psych: affect appropriate      Labs and Diagnostic Tests:  Urinalysis:  Lab Results   Component Value Date/Time    UCOLOR YELLOW 11/29/2018 09:14 AM    TURBID CLEAR 11/29/2018 09:14 AM    USPGR 1.016 11/29/2018 09:14 AM    UPH 5.0 11/29/2018 09:14 AM    UPROTEIN NEG 11/29/2018 09:14 AM    UAGLU NEG 11/29/2018 09:14 AM    UKET NEG 11/29/2018 09:14 AM    UBILE NEG 11/29/2018 09:14 AM UBLD NEG 11/29/2018 09:14 AM    UROB NORMAL 11/29/2018 09:14 AM       CBC with Diff:  CBC with Diff Latest Ref Rng & Units 11/29/2018 11/24/2018   WBC 4.5 - 11.0 K/UL 3.7(L) 4.9   RBC 4.0 - 5.0 M/UL 2.89(L) 3.04(L)   HGB 12.0 - 15.0 GM/DL 1.6(X) 0.9(U)   HCT 36 - 45 % 28.6(L) 31.6(L)   MCV 80 - 100 FL 99.0 104.0(H)   MCH 26 - 34 PG 31.9 31.1(H)   MCHC 32.0 - 36.0 G/DL 04.5 29.9(L)   RDW 11 - 15 % 14.6 14.4   PLT 150 - 400 K/UL 160 127(L)   MPV 7 - 11 FL 7.9 -   NEUT 41 - 77 % 63 73.0   ANC 1.8 - 7.0 K/UL 2.38 3.6   LYMA 24 - 44 % 24 -   ALYM 1.0 - 4.8 K/UL 0.90(L) 0.9   MONA 4 - 12 % 11 -   AMONO 0 - 0.80 K/UL 0.42 0.3   EOSA 0 - 5 % 1 -   AEOS 0 - 0.45 K/UL 0.03 0.0   BASA 0 - 2 % 1 -   ABAS 0 - 0.20 K/UL 0.03 0.0       CMP:  CMP Latest Ref Rng & Units 11/29/2018 11/24/2018   NA 137 - 147 MMOL/L 137 135(L)   K 3.5 - 5.1 MMOL/L 4.1 3.8   CL 98 - 110 MMOL/L 107 110(H)   CO2 21 - 30 MMOL/L 20(L) 16.0(L)   GAP 3 - 12 10 13    BUN 7 - 25 MG/DL 23 22.0(H)   CR 0.4 - 1.00 MG/DL 4.09(W) 1.19(J)   GLUX 70 - 100 MG/DL 478(G) -   CA 8.5 - 95.6 MG/DL 9.3 8.8   TP 6.0 - 8.0 G/DL 6.4 2.1(H)   ALB 3.5 - 5.0 G/DL 4.2 3.9   ALKP 25 - 086 U/L 66 73   ALT 7 - 56 U/L 9 9   TBILI 0.3 - 1.2 MG/DL 0.3 5.78(I)   GFR >69 mL/min 23(L) 23.0(L)   GFRAA >60 mL/min 27(L) -       No results found for: CMVDNAPCR  BK Virus Plasma Quant (no units)   Date Value   11/15/2018     NOT DETECTED  Reference range: NOT DETECTED  Unit: copies/mL  .  Assay Range: 39 copies/mL to 1.00E+10 copies/mL  .  The limit of quantitation (LOQ) is 39 copies/mL. BK virus DNA  detected below the  LOQ will be reported as Detected:<39 copies/mL.  .  This test was developed and its performance characteristics  determined by  [employer]. It has not been cleared or approved by the U.[place].  Food and Drug  Administration. Results should be used in conjunction with clinical  findings,  and should not form the sole basis for a diagnosis or treatment  decision. ____________________________________________________________  Testing Performed At:  [facility]  [address]  [place], [place] [address]  [phone]  CLIA ID: [id number]     10/18/2018     NOT DETECTED  Reference range: NOT DETECTED  Unit: copies/mL  .  Assay Range: 39 copies/mL to 1.00E+10 copies/mL  .  The limit of quantitation (LOQ) is 39 copies/mL. BK virus DNA  detected below the  LOQ will be reported as Detected:<39 copies/mL.  .  This test was developed and its performance characteristics  determined by  [employer]. It has not been cleared or approved by the U.S.  Food and Drug  Administration. Results should be used in conjunction with clinical  findings,  and should not form the sole basis for a diagnosis or treatment  decision.  ____________________________________________________________  Testing Performed At:  [facility]  [address]  [place], [place] [address]  [phone]  CLIA ID: [id number]     10/04/2018     NOT DETECTED  Reference range: NOT DETECTED  Unit: copies/mL  .  Assay Range: 39 copies/mL to 1.00E+10 copies/mL  .  The limit of quantitation (LOQ) is 39 copies/mL. BK virus DNA  detected below the  LOQ will be reported as Detected:<39 copies/mL.  .  This test was developed and its performance characteristics  determined by  [employer]. It has not been cleared or approved by the U.S.  Food and Drug  Administration. Results should be used in conjunction with clinical  findings,  and should not form the sole basis for a diagnosis or treatment  decision.  ____________________________________________________________  Testing Performed At:  [facility]  [address]  [place], [place] [address]  [phone]  CLIA ID: [id number]       No results found for: [facility]    Other Common Labs:  Other Common Labs Latest Ref Rng & Units 11/29/2018 11/24/2018   IRON 50 - 160 MCG/DL - -   TIBC 710 - 626 MCG/DL - -   PSAT 28 - 42 % - - FER 10 - 200 NG/ML - -   PO4 2.0 - 4.5 MG/DL 2.7 3.2   MG 1.6 - 2.6 mg/dL 9.4(W) 5.4(O)   UPRO NEG-NEG NEG Negative   UCRR MG/DL 270 35.00   UPCRC - 0.2 0.18573   URICA 2.0 - 7.0 MG/DL 6.1 9.3(G)   HWE9H 4.0 - 6.0 % - -   Tacrolimus - - 10.2         Tacrolimus LC-MS/MS (no units)   Date Value   11/15/2018 19.0   11/01/2018 9.1   10/25/2018 6.9   10/18/2018 16.9   10/04/2018 11.0       Imaging: Renal USN (10/25/18)    The right lower quad transplant kidney measures 11.1 cm in length. ???There   is mild renal pelviectasis without frank hydronephrosis. Nephroureteral   stent  is in place. Multiple renal calculi are seen in the mid and lower   transplant kidney. The intrarenal resistive indices range from 0.66-0.70.   The main renal artery and vein appear patent. Maximum main renal artery   velocity is 177 cm/s, previously reaching 420 cm/s at the anastomosis..   The main renal artery appears tortuous.     The right iliac artery and vein appear patent. The maximum iliac artery   velocity is 112 cm/s.    There is a perinephric fluid collection or possible seroma along the   medial and inferior aspect of the transplant kidney measuring 4.2 x 4.2 x   7.3 cm. This compares to 5.5 x 2.6 x 8.2 cm on the previous study.   Additional subincisional fluid collection is seen measuring 12.1 x 2.7 cm,   previously 11.8 x 3.5 cm.    The bladder is decompressed.    Renal USN (11/15/2018):  1. ???Mild dilatation of the renal collecting system following   nephroureteral stent removal. This may be due to mild ureteral stricture   or possibly recent/occult ureteral calculus. If the patient is managed   conservatively, recommend repeat renal ultrasound in 1-2 days to evaluate   for persistence or resolution.   2. ???Patent renal vasculature. No evidence of large vessel compromise of   the graft. Decrease in size of perinephric and subincisional fluid   collections.   3. ???Nephrolithiasis.     Assessment and Plan: Michele Benitez is a 58 y.o. 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  ???  1. Immunosuppression: on tacro/MPA/prednisone; aiming for tacro levels between 8-12 ng/ml by LCMS or 10-15 ng/ml per MEIA. Prior MEIA level at goal. MPA was not decreased following last visit although diarrhea has improved. Leukopenia noted on labs today so will still ask her to decrease dose of MPA to 540 mg BID. Continue Prednisone 5 mg daily.   2. Renal function:  Scr 2.25 mg/dl which is improved; UA fairly bland. Repeat US today showing little change from prior US.  3. HTN: Better improved following increase of Nifedipine to 60 mg daily. Continue Coreg 25 mg BID as well.  4. Infection: will need 3 months of Valcyte and 12 months of Bactrim for CMV and PCP/UTI prophylaxis respectively  5. Anemia: unchanged; iron stores good; Fe 61 on 6/13; continue Aranesp  6. Hyperuricemia: watch for now; UA should improve as renal function improves  7. CAD: mild; continue on asa, Zocor and Coreg  8. Nephrolithiasis: found in transplanted kidney. Started on Potassium Citrate 20 meq BID following Litholink results.     Terance Hart MD  Nephrology fellow    I have personally seen and examined this patient with the fellow, performed the key portions of the E/M visit, discussed the case and concur with the fellow's documentation of history, physical exam, assessment, and treatment plan. Use, side effects and level of immunosuppressive meds reviewed and discussed with the patient and team.    Michele Lao, MD      Cc: [name]  CC: [place], [place] is appropriate for kidney transplant nursing management of immunosuppression medications and laboratory values. The goal is to maintain a therapeutic effect and to decrease the chance of medication nephrotoxicity, rejection and infection while maintaining good allograft function.

## 2018-11-29 NOTE — Progress Notes
Patient tolerated injection, no reaction noted.

## 2018-11-30 ENCOUNTER — Encounter: Admit: 2018-11-30 | Discharge: 2018-11-30

## 2018-11-30 DIAGNOSIS — Z5181 Encounter for therapeutic drug level monitoring: Secondary | ICD-10-CM

## 2018-11-30 DIAGNOSIS — Z940 Kidney transplant status: Secondary | ICD-10-CM

## 2018-11-30 DIAGNOSIS — D631 Anemia in chronic kidney disease: Secondary | ICD-10-CM

## 2018-11-30 LAB — URIC ACID: Lab: 6.7 mg/dL — ABNORMAL HIGH (ref 2.6–6.0)

## 2018-11-30 LAB — COMPREHENSIVE METABOLIC PANEL
Lab: 0.2 mg/dL (ref 0.2–1.2)
Lab: 10
Lab: 115 — ABNORMAL HIGH (ref 70–105)
Lab: 12 pg — ABNORMAL HIGH (ref 27.0–31.0)
Lab: 139 mmol/L — ABNORMAL LOW (ref 136–145)
Lab: 19 mmol/L — ABNORMAL LOW (ref 22–29)
Lab: 2.3 mg/dL — ABNORMAL HIGH (ref 0.57–1.11)
Lab: 20 mg/dL (ref 9.8–20.1)
Lab: 22 10*3/uL — AB (ref >59)
Lab: 9 mg/dL (ref 8.4–10.2)

## 2018-11-30 LAB — MAGNESIUM: Lab: 1.5 mg/dL — ABNORMAL LOW (ref 1.6–2.6)

## 2018-11-30 LAB — PHOSPHORUS: Lab: 3.5

## 2018-11-30 LAB — TACROLIMUS LC-MS/MS: Lab: 5.8

## 2018-11-30 LAB — CBC AND DIFF: Lab: 4.8 10*3/uL (ref 4.8–10.8)

## 2018-11-30 NOTE — Progress Notes
Office visit note from 11/29/18 reviewed.  Myfortic decreased from 720 mg bid to 540 mg bid.  Ultrasound done 11/29/18 reviewed by Dr. Abram Sander with no change to POC.

## 2018-12-01 ENCOUNTER — Encounter: Admit: 2018-12-01 | Discharge: 2018-12-01

## 2018-12-01 DIAGNOSIS — Z5181 Encounter for therapeutic drug level monitoring: Secondary | ICD-10-CM

## 2018-12-01 DIAGNOSIS — Z940 Kidney transplant status: Secondary | ICD-10-CM

## 2018-12-01 LAB — URINALYSIS DIPSTICK REFLEX TO CULTURE
Lab: 0.2
Lab: NEGATIVE
Lab: NEGATIVE
Lab: NEGATIVE
Lab: NEGATIVE

## 2018-12-01 LAB — PROTEIN/CR RATIO,UR RAN
Lab: 0.1
Lab: 11
Lab: 70

## 2018-12-01 LAB — URINALYSIS MICROSCOPIC REFLEX TO CULTURE

## 2018-12-02 ENCOUNTER — Encounter: Admit: 2018-12-02 | Discharge: 2018-12-02

## 2018-12-02 NOTE — Telephone Encounter
Patient is 3 months post transplant as of 12/02/18.  Patient asked to discontinue Valcyte and may decrease lab frequency to weekly.

## 2018-12-04 ENCOUNTER — Encounter: Admit: 2018-12-04 | Discharge: 2018-12-04

## 2018-12-05 ENCOUNTER — Encounter: Admit: 2018-12-05 | Discharge: 2018-12-05 | Payer: MEDICARE

## 2018-12-05 LAB — CBC AND DIFF
Lab: 1 (ref 0–2)
Lab: 106 — ABNORMAL HIGH (ref 80.0–99.0)
Lab: 13 — ABNORMAL HIGH (ref 0–10)
Lab: 2.9 — ABNORMAL LOW (ref 4.8–10.8)
Lab: 3.2 — ABNORMAL LOW (ref 4.20–5.40)
Lab: 31 (ref 18–47)
Lab: 5 (ref 0–8)
Lab: 50 (ref 40–75)
Lab: 10 g/dL — ABNORMAL LOW (ref 12.0–16)
Lab: 14 % (ref 11.5–14.5)
Lab: 171 uL
Lab: 29 % — ABNORMAL LOW (ref 30.0–34.0)
Lab: 30 pg (ref 27.0–31.0)
Lab: 34 % — ABNORMAL LOW (ref 37.0–47.0)

## 2018-12-05 LAB — URINALYSIS DIPSTICK REFLEX TO CULTURE
Lab: 0.2
Lab: NEGATIVE
Lab: NEGATIVE
Lab: NEGATIVE
Lab: NEGATIVE
Lab: NEGATIVE
Lab: NEGATIVE

## 2018-12-05 LAB — PROTEIN/CR RATIO,UR RAN
Lab: 0.2 mg/mg — ABNORMAL HIGH (ref <0.2)
Lab: 30 mmol/L — ABNORMAL LOW (ref 47–110)
Lab: 8.2 — ABNORMAL HIGH (ref 1–14)

## 2018-12-05 LAB — PHOSPHORUS: Lab: 3.2 mg/dL (ref 2.3–4.7)

## 2018-12-05 LAB — COMPREHENSIVE METABOLIC PANEL
Lab: 1.9 — ABNORMAL HIGH (ref 0.57–1.11)
Lab: 4
Lab: 96 (ref 70–105)
Lab: 0.3 mg/dL (ref 0.2–1.2)
Lab: 13 U/L (ref 0–55)
Lab: 138 mmol/L (ref 136–145)
Lab: 16 U/L
Lab: 27 mL/min/{1.73_m2} — ABNORMAL LOW (ref >59)
Lab: 6.4 g/dL (ref 6.4–8.3)
Lab: 70 U/L (ref 40–150)
Lab: 9.5 mg/dL (ref 8.4–10.2)

## 2018-12-05 LAB — URINALYSIS MICROSCOPIC REFLEX TO CULTURE

## 2018-12-05 LAB — MAGNESIUM: Lab: 1.6 mg/dL (ref 1.6–2.6)

## 2018-12-05 LAB — URIC ACID: Lab: 5.8 mg/dL (ref 2.6–6.0)

## 2018-12-06 ENCOUNTER — Encounter: Admit: 2018-12-06 | Discharge: 2018-12-06 | Payer: MEDICARE

## 2018-12-06 ENCOUNTER — Ambulatory Visit: Admit: 2018-12-06 | Discharge: 2018-12-07 | Payer: MEDICARE

## 2018-12-06 LAB — COMPREHENSIVE METABOLIC PANEL
Lab: 0.2 mg/dL — ABNORMAL HIGH (ref 0.2–1.2)
Lab: 10 U/L (ref 0–55)
Lab: 104 (ref 70–105)
Lab: 107 mmol/L — ABNORMAL LOW (ref 98–107)
Lab: 13 U/L
Lab: 139 mmol/L — ABNORMAL LOW (ref 136–145)
Lab: 16 pg — ABNORMAL HIGH (ref 0–14)
Lab: 18 mg/dL (ref 9.8–20.1)
Lab: 2 mg/dL — ABNORMAL HIGH (ref 0.57–1.11)
Lab: 20 mmol/L — ABNORMAL LOW (ref 22–29)
Lab: 26 — ABNORMAL LOW (ref >59)
Lab: 4 g/dL
Lab: 4.3 mmol/L — ABNORMAL LOW (ref 3.5–5.1)
Lab: 6.2 g/dL — ABNORMAL LOW (ref 6.4–8.3)
Lab: 76 U/L (ref 40–150)
Lab: 9 mg/dL (ref 8.4–10.2)

## 2018-12-06 LAB — CBC AND DIFF
Lab: 0
Lab: 0
Lab: 3.3 uL — ABNORMAL LOW (ref 4.8–10.8)

## 2018-12-06 LAB — URINALYSIS DIPSTICK REFLEX TO CULTURE
Lab: NEGATIVE % (ref 24–44)
Lab: NEGATIVE % (ref 4–12)

## 2018-12-06 LAB — TACROLIMUS IMMUNOASSAY (FK506): Lab: 10 ug/L (ref 5.0–20.0)

## 2018-12-06 LAB — URINALYSIS MICROSCOPIC REFLEX TO CULTURE

## 2018-12-06 LAB — MAGNESIUM: Lab: 1.5 mg/dL — ABNORMAL LOW (ref <0.2)

## 2018-12-06 LAB — PHOSPHORUS: Lab: 2 /dL (ref 1–14)

## 2018-12-06 LAB — URIC ACID: Lab: 6.2 mg/dL — ABNORMAL HIGH (ref 2.6–6.0)

## 2018-12-06 LAB — PROTEIN/CR RATIO,UR RAN: Lab: 48 10*3/uL — AB (ref 47–110)

## 2018-12-06 MED ORDER — DARBEPOETIN ALFA IN POLYSORBAT 40 MCG/0.4 ML IJ SYRG
40 ug | Freq: Once | SUBCUTANEOUS | 0 refills | Status: CN
Start: 2018-12-06 — End: ?

## 2018-12-06 MED ORDER — DARBEPOETIN ALFA IN POLYSORBAT 40 MCG/0.4 ML IJ SYRG
40 ug | Freq: Once | SUBCUTANEOUS | 0 refills | Status: CP
Start: 2018-12-06 — End: ?
  Administered 2018-12-06: 12:00:00 40 ug via SUBCUTANEOUS

## 2018-12-06 MED FILL — SULFAMETHOXAZOLE-TRIMETHOPRIM 400-80 MG PO TAB: 80/400 mg | ORAL | 30 days supply | Qty: 30 | Fill #4 | Status: AC

## 2018-12-06 NOTE — Patient Instructions
Post infusion emergency medical treatment: Go to the closest Emergency Department or call 911. For non-urgent questions or concerns, call [phone] after 0900 the following day(including weekends & holidays). For urgent medical questions, call [phone] and ask to speak to the On-Call Physician covering for the physician prescribing your infusion medication.

## 2018-12-08 ENCOUNTER — Encounter: Admit: 2018-12-08 | Discharge: 2018-12-08 | Payer: MEDICARE

## 2018-12-08 LAB — TACROLIMUS IMMUNOASSAY (FK506): Lab: 7.6

## 2018-12-09 ENCOUNTER — Encounter: Admit: 2018-12-09 | Discharge: 2018-12-09 | Payer: MEDICARE

## 2018-12-12 ENCOUNTER — Encounter: Admit: 2018-12-12 | Discharge: 2018-12-12 | Payer: MEDICARE

## 2018-12-16 ENCOUNTER — Encounter: Admit: 2018-12-16 | Discharge: 2018-12-16 | Payer: MEDICARE

## 2018-12-16 LAB — COMPREHENSIVE METABOLIC PANEL
Lab: 114 — ABNORMAL HIGH (ref 70–105)
Lab: 15 — ABNORMAL HIGH (ref 0–14)
Lab: 2.1 — ABNORMAL HIGH (ref 0.57–1.11)
Lab: 21 — ABNORMAL LOW (ref 22–29)
Lab: 24 — AB (ref >59)
Lab: 32 — ABNORMAL HIGH (ref 9.8–20.1)
Lab: 139 mmol/L (ref 136–145)
Lab: 17 U/L

## 2018-12-16 LAB — CBC AND DIFF
Lab: 0
Lab: 0
Lab: 0.4
Lab: 1
Lab: 1.3
Lab: 10 — ABNORMAL LOW (ref 12.0–16.0)
Lab: 104 — ABNORMAL HIGH (ref 80.0–99.0)
Lab: 2.6
Lab: 28
Lab: 3.3 — ABNORMAL LOW (ref 4.20–5.40)
Lab: 58
Lab: 1 uL
Lab: 13 % (ref 11.5–14.5)
Lab: 165 uL
Lab: 30 % (ref 30.0–34.0)
Lab: 31 pg — ABNORMAL HIGH (ref 27.0–31.0)
Lab: 35 % — ABNORMAL LOW (ref 37.0–47.0)
Lab: 4.5 10*3/uL — ABNORMAL LOW (ref 4.8–10.8)
Lab: 9 %

## 2018-12-16 LAB — URINALYSIS MICROSCOPIC REFLEX TO CULTURE

## 2018-12-16 LAB — URINALYSIS DIPSTICK REFLEX TO CULTURE
Lab: 0.2
Lab: NEGATIVE
Lab: NEGATIVE
Lab: NEGATIVE
Lab: NEGATIVE
Lab: NEGATIVE

## 2018-12-16 LAB — TACROLIMUS IMMUNOASSAY (FK506): Lab: 7.3 ug/L (ref >60)

## 2018-12-16 LAB — URIC ACID: Lab: 6.8 mg/dL — ABNORMAL HIGH (ref 2.6–6.0)

## 2018-12-16 LAB — MAGNESIUM: Lab: 1.6 mg/dL (ref 1.6–2.6)

## 2018-12-16 LAB — PROTEIN/CR RATIO,UR RAN: Lab: 0.2 U/L — ABNORMAL HIGH (ref <0.2)

## 2018-12-21 ENCOUNTER — Encounter: Admit: 2018-12-21 | Discharge: 2018-12-21 | Payer: MEDICARE

## 2018-12-21 DIAGNOSIS — Z5181 Encounter for therapeutic drug level monitoring: Secondary | ICD-10-CM

## 2018-12-21 DIAGNOSIS — Z940 Kidney transplant status: Secondary | ICD-10-CM

## 2018-12-21 LAB — COMPREHENSIVE METABOLIC PANEL
Lab: 1.9 — ABNORMAL HIGH (ref 0.57–1.11)
Lab: 16 — ABNORMAL HIGH (ref 0–14)
Lab: 4.3
Lab: 0.3 mg/dL
Lab: 141 mmol/L
Lab: 19 U/L
Lab: 20 U/L
Lab: 20 mg/dL
Lab: 24 mmol/L
Lab: 27 mL/min/{1.73_m2} — AB (ref >59)
Lab: 6.9 g/dL
Lab: 82 U/L
Lab: 9.7 mg/dL

## 2018-12-21 LAB — URINALYSIS DIPSTICK REFLEX TO CULTURE
Lab: 0.2
Lab: NEGATIVE
Lab: NEGATIVE
Lab: NEGATIVE
Lab: NEGATIVE
Lab: NEGATIVE

## 2018-12-21 LAB — CBC AND DIFF: Lab: 4.6 — ABNORMAL LOW (ref 4.8–10.8)

## 2018-12-21 LAB — URINALYSIS MICROSCOPIC REFLEX TO CULTURE

## 2018-12-21 LAB — URIC ACID: Lab: 6.2 mmol/L — ABNORMAL HIGH (ref 2.6–6.0)

## 2018-12-21 LAB — MAGNESIUM: Lab: 1.8 mmol/L

## 2018-12-23 ENCOUNTER — Encounter: Admit: 2018-12-23 | Discharge: 2018-12-23 | Payer: MEDICARE

## 2018-12-23 DIAGNOSIS — Z5181 Encounter for therapeutic drug level monitoring: Secondary | ICD-10-CM

## 2018-12-23 DIAGNOSIS — Z940 Kidney transplant status: Secondary | ICD-10-CM

## 2018-12-23 LAB — PROTEIN/CR RATIO,UR RAN
Lab: 20 — ABNORMAL LOW (ref 47–110)
Lab: 6.9
Lab: 0.3 mg/mg — ABNORMAL HIGH (ref <0.2)

## 2018-12-23 LAB — TACROLIMUS IMMUNOASSAY (FK506): Lab: 7.8 ug/L (ref 5.0–20.0)

## 2018-12-23 NOTE — Progress Notes
Labs drawn 12/21/18 reviewed.  Creatinine stable at 1.97.  Tacrolimus at trough goal at 7.8.

## 2018-12-27 ENCOUNTER — Encounter: Admit: 2018-12-27 | Discharge: 2018-12-27 | Payer: MEDICARE

## 2018-12-27 ENCOUNTER — Ambulatory Visit: Admit: 2018-12-27 | Discharge: 2018-12-27 | Payer: MEDICARE

## 2018-12-27 LAB — COMPREHENSIVE METABOLIC PANEL
Lab: 1.9 mg/dL — ABNORMAL HIGH (ref 0.4–1.00)
Lab: 138 MMOL/L — ABNORMAL LOW (ref 137–147)
Lab: 22 U/L (ref 7–40)
Lab: 24 MMOL/L (ref 21–30)
Lab: 24 U/L (ref 7–56)
Lab: 26 mL/min — ABNORMAL LOW (ref >60)
Lab: 32 mL/min — ABNORMAL LOW (ref >60)
Lab: 4.3 MMOL/L — ABNORMAL LOW (ref 3.5–5.1)
Lab: 4.5 g/dL — ABNORMAL LOW (ref 3.5–5.0)
Lab: 6.9 g/dL (ref 6.0–8.0)
Lab: 99 mg/dL (ref 70–100)

## 2018-12-27 LAB — URINALYSIS DIPSTICK REFLEX TO CULTURE
Lab: NEGATIVE
Lab: NEGATIVE
Lab: NEGATIVE
Lab: NEGATIVE
Lab: NEGATIVE
Lab: NEGATIVE

## 2018-12-27 LAB — CBC AND DIFF
Lab: 0 10*3/uL (ref 0–0.20)
Lab: 3.2 M/UL — ABNORMAL LOW (ref 4.0–5.0)
Lab: 3.9 10*3/uL — ABNORMAL LOW (ref 4.5–11.0)

## 2018-12-27 LAB — URINALYSIS MICROSCOPIC REFLEX TO CULTURE

## 2018-12-27 LAB — MAGNESIUM: Lab: 1.8 mg/dL (ref 1.6–2.6)

## 2018-12-27 LAB — ALLOSURE

## 2018-12-27 LAB — PHOSPHORUS: Lab: 3.4 mg/dL — ABNORMAL HIGH (ref 2.0–4.5)

## 2018-12-27 LAB — URIC ACID: Lab: 5.7 mg/dL — ABNORMAL LOW (ref 2.0–7.0)

## 2018-12-27 LAB — PROTEIN/CR RATIO,UR RAN
Lab: 18 mg/dL
Lab: 61 mg/dL

## 2018-12-27 MED FILL — PANTOPRAZOLE 40 MG PO TBEC: 40 mg | ORAL | 30 days supply | Qty: 180 | Fill #2 | Status: CP

## 2018-12-27 MED FILL — SULFAMETHOXAZOLE-TRIMETHOPRIM 400-80 MG PO TAB: 80/400 mg | ORAL | 30 days supply | Qty: 30 | Fill #5 | Status: CP

## 2018-12-27 MED FILL — PREDNISONE 5 MG PO TAB: 5 mg | ORAL | 30 days supply | Qty: 90 | Fill #4 | Status: CP

## 2018-12-27 MED FILL — CARVEDILOL 25 MG PO TAB: 25 mg | ORAL | 30 days supply | Qty: 60 | Fill #4 | Status: CP

## 2018-12-27 NOTE — Progress Notes
Office visit note from 12/27/18 reviewed.  DSA and Allosure pending.  Tacrolimus level pending.

## 2018-12-28 LAB — TACROLIMUS LC-MS/MS: Lab: 14

## 2018-12-28 MED ORDER — TACROLIMUS 0.5 MG PO CAP
.5 mg | ORAL_CAPSULE | Freq: Two times a day (BID) | ORAL | 3 refills | 30.00000 days | Status: DC
Start: 2018-12-28 — End: 2019-01-12
  Filled 2018-12-28: qty 180, 30d supply, fill #1

## 2018-12-28 NOTE — Telephone Encounter
Patient's Prograf level from 12/27/18 above trough goal at 14.4 pm 1 mg bid.  Per Dr. Abram Sander, patient to decrease dose to 0.5 mg bid.  Patient's husband repeated dose adjustment to NC.

## 2018-12-29 LAB — CMV QUANT PCR-BLOOD

## 2018-12-30 ENCOUNTER — Encounter: Admit: 2018-12-30 | Discharge: 2018-12-30 | Payer: MEDICARE

## 2018-12-30 DIAGNOSIS — B259 Cytomegaloviral disease, unspecified: Secondary | ICD-10-CM

## 2018-12-30 MED ORDER — MYCOPHENOLATE SODIUM 180 MG PO TBEC
360 mg | ORAL_TABLET | Freq: Two times a day (BID) | ORAL | 3 refills | Status: DC
Start: 2018-12-30 — End: 2019-01-31
  Filled 2019-01-30: qty 360, 30d supply, fill #1

## 2018-12-30 NOTE — Telephone Encounter
Patient positive for CMV on labs drawn 12/27/18 at 145 copies.  Reviewed with Dr. Abram Sander.  Patient to decrease Myfortic from 540 mg bid to 360 mg bid and recheck CMV in 2 weeks.  Order faxed to [facility] lab.  Patient's husband notified of dose adjustment.

## 2019-01-02 ENCOUNTER — Encounter: Admit: 2019-01-02 | Discharge: 2019-01-02 | Payer: MEDICARE

## 2019-01-03 ENCOUNTER — Encounter: Admit: 2019-01-03 | Discharge: 2019-01-03 | Payer: MEDICARE

## 2019-01-03 DIAGNOSIS — Z5181 Encounter for therapeutic drug level monitoring: Secondary | ICD-10-CM

## 2019-01-03 DIAGNOSIS — Z940 Kidney transplant status: Secondary | ICD-10-CM

## 2019-01-03 LAB — URINALYSIS MICROSCOPIC REFLEX TO CULTURE

## 2019-01-03 LAB — URIC ACID: Lab: 6.3 mg/dL — ABNORMAL HIGH (ref 2.6–6.0)

## 2019-01-03 LAB — COMPREHENSIVE METABOLIC PANEL
Lab: 0.3 mg/dL (ref 0.2–1.2)
Lab: 107 mmol/L — ABNORMAL LOW (ref 98–107)
Lab: 118 mg/dL — ABNORMAL HIGH (ref 70–105)
Lab: 134 U/L (ref 40–150)
Lab: 141 mmol/L (ref 136–145)
Lab: 18 mg/dL (ref 9.8–20.1)
Lab: 2 mg/dL — ABNORMAL HIGH (ref 0.57–1.11)
Lab: 23 mmol/L (ref 22–29)
Lab: 26 mL/Min/1.73 — ABNORMAL LOW (ref >59)
Lab: 30 U/L — ABNORMAL LOW (ref 5–34)
Lab: 34 U/L (ref 0–55)
Lab: 4 g/dL (ref 3.5–5.0)
Lab: 4.2 mmol/L (ref 3.5–5.1)
Lab: 6.9 g/dL (ref 6.4–8.3)
Lab: 9.7 mg/dL (ref 8.4–10.2)

## 2019-01-03 LAB — PHOSPHORUS: Lab: 3.5 mg/dL — ABNORMAL HIGH (ref 2.3–4.7)

## 2019-01-03 LAB — URINALYSIS DIPSTICK REFLEX TO CULTURE
Lab: 1 (ref 1.002–1.030)
Lab: 7 (ref 5.0–7.0)
Lab: NEGATIVE

## 2019-01-03 LAB — CBC AND DIFF
Lab: 10 g/dL — ABNORMAL LOW (ref 12.0–16.0)
Lab: 102 fL — ABNORMAL HIGH (ref 80.0–99.0)
Lab: 3 10*3/uL — ABNORMAL LOW (ref 4.8–10.8)
Lab: 3.2 10*6/uL — ABNORMAL LOW (ref 4.20–5.40)
Lab: 31 pg — ABNORMAL HIGH (ref 27.0–31.0)
Lab: 33 % — ABNORMAL LOW (ref 37.0–47.0)

## 2019-01-03 LAB — PROTEIN/CR RATIO,UR RAN
Lab: 0.2 mg/mg — ABNORMAL HIGH (ref <0.2)
Lab: 14 mg/dL — ABNORMAL HIGH (ref 1–14)
Lab: 52 mg/dL (ref 47–110)

## 2019-01-03 LAB — MAGNESIUM: Lab: 2 mg/dL (ref 1.6–2.6)

## 2019-01-03 NOTE — Telephone Encounter
On call coordinator received page from [place] with [employer]. I returned the call and spoke to [facility], she stated that patient had prograf level drawn on 01/02/2019 and the result was 4.9. I repeated this back to her and she confirmed the result I stated was correct. I discussed with on call nephrologist, Dr. Lenice Llamas, he stated to notify primary coordinator to follow up in the morning. Coordinator asked lab to fax the results over as well.

## 2019-01-04 ENCOUNTER — Encounter: Admit: 2019-01-04 | Discharge: 2019-01-04 | Payer: MEDICARE

## 2019-01-04 DIAGNOSIS — Z5181 Encounter for therapeutic drug level monitoring: Secondary | ICD-10-CM

## 2019-01-04 DIAGNOSIS — Z940 Kidney transplant status: Secondary | ICD-10-CM

## 2019-01-04 LAB — TACROLIMUS IMMUNOASSAY (FK506): Lab: 4.9 U/L — ABNORMAL LOW (ref 5.0–20.0)

## 2019-01-05 ENCOUNTER — Encounter: Admit: 2019-01-05 | Discharge: 2019-01-05 | Payer: MEDICARE

## 2019-01-05 NOTE — Progress Notes
DSA drawn 01/06/19 negative for DSA.  Allosure <0.12.

## 2019-01-10 ENCOUNTER — Encounter: Admit: 2019-01-10 | Discharge: 2019-01-10 | Payer: MEDICARE

## 2019-01-10 DIAGNOSIS — Z5181 Encounter for therapeutic drug level monitoring: Secondary | ICD-10-CM

## 2019-01-10 DIAGNOSIS — Z940 Kidney transplant status: Secondary | ICD-10-CM

## 2019-01-10 LAB — CBC AND DIFF
Lab: 0.1
Lab: 0.1
Lab: 0.6
Lab: 1.8 — ABNORMAL LOW (ref 1.9–8.1)
Lab: 1.9
Lab: 116 — ABNORMAL LOW (ref 130–400)
Lab: 3.2 — ABNORMAL LOW (ref 4.20–5.40)
Lab: 40
Lab: 43
Lab: 13 % (ref 11.5–14.5)
Lab: 2 10*3/uL (ref 0.9–5.1)
Lab: 30 % (ref 30.0–34.0)
Lab: 31 pg — ABNORMAL HIGH (ref 27.0–31.0)
Lab: 4.6 10*3/uL — ABNORMAL LOW (ref 4.8–10.8)

## 2019-01-10 LAB — COMPREHENSIVE METABOLIC PANEL
Lab: 21 — ABNORMAL HIGH (ref 9.8–20.1)
Lab: 25 — AB (ref >59)
Lab: 3.7
Lab: 0.3 mg/dL (ref 0.2–1.2)
Lab: 131 — ABNORMAL HIGH (ref 70–105)
Lab: 142 mmol/L (ref 136–145)
Lab: 17 — ABNORMAL HIGH (ref 0–14)
Lab: 206 U/L — ABNORMAL HIGH (ref 40–150)
Lab: 21 — ABNORMAL LOW (ref 22–29)
Lab: 33 U/L
Lab: 33 U/L (ref 0–55)
Lab: 6.8 g/dL (ref 6.4–8.3)
Lab: 9.7 mg/dL (ref 8.4–10.2)

## 2019-01-10 LAB — MAGNESIUM: Lab: 2 mg/dL — AB (ref 1.6–2.6)

## 2019-01-10 LAB — URINALYSIS DIPSTICK REFLEX TO CULTURE

## 2019-01-10 LAB — PROTEIN/CR RATIO,UR RAN
Lab: 21 — ABNORMAL HIGH (ref 1–14)
Lab: 0.2 g/dL — ABNORMAL HIGH (ref <0.2)
Lab: 97 % — ABNORMAL LOW (ref 47–110)

## 2019-01-10 LAB — URINALYSIS MICROSCOPIC REFLEX TO CULTURE

## 2019-01-10 LAB — URIC ACID: Lab: 7.3 mg/dL — ABNORMAL HIGH (ref 2.6–6.0)

## 2019-01-12 ENCOUNTER — Encounter: Admit: 2019-01-12 | Discharge: 2019-01-12 | Payer: MEDICARE

## 2019-01-12 DIAGNOSIS — D849 Immunodeficiency, unspecified: Secondary | ICD-10-CM

## 2019-01-12 DIAGNOSIS — Z940 Kidney transplant status: Secondary | ICD-10-CM

## 2019-01-12 DIAGNOSIS — Z5181 Encounter for therapeutic drug level monitoring: Secondary | ICD-10-CM

## 2019-01-12 LAB — TACROLIMUS IMMUNOASSAY (FK506): Lab: 4.5 — ABNORMAL LOW (ref 5.0–20.0)

## 2019-01-12 MED ORDER — TACROLIMUS 0.5 MG PO CAP
ORAL_CAPSULE | Freq: Every day | ORAL | 3 refills | 30.00000 days | Status: DC
Start: 2019-01-12 — End: 2019-01-12
  Filled 2019-01-16: qty 180, 30d supply, fill #1

## 2019-01-12 MED ORDER — TACROLIMUS 0.5 MG PO CAP
ORAL_CAPSULE | Freq: Every day | ORAL | 3 refills | 30.00000 days | Status: DC
Start: 2019-01-12 — End: 2019-01-20

## 2019-01-12 NOTE — Telephone Encounter
Orders per Dr. Abram Sander to increase Prograf to 0.5 mg AM and 1.5 mg PM. Pt's spouse, Jeneen Rinks read back dose change correctly. No other changes at this time.

## 2019-01-16 ENCOUNTER — Encounter: Admit: 2019-01-16 | Discharge: 2019-01-16 | Payer: MEDICARE

## 2019-01-17 ENCOUNTER — Encounter: Admit: 2019-01-17 | Discharge: 2019-01-17 | Payer: MEDICARE

## 2019-01-17 DIAGNOSIS — Z940 Kidney transplant status: Secondary | ICD-10-CM

## 2019-01-17 DIAGNOSIS — Z5181 Encounter for therapeutic drug level monitoring: Secondary | ICD-10-CM

## 2019-01-17 NOTE — Progress Notes
Allosure drawn 01/02/19 <0.12.

## 2019-01-18 ENCOUNTER — Encounter: Admit: 2019-01-18 | Discharge: 2019-01-18 | Payer: MEDICARE

## 2019-01-19 ENCOUNTER — Encounter: Admit: 2019-01-19 | Discharge: 2019-01-19 | Payer: MEDICARE

## 2019-01-19 DIAGNOSIS — Z940 Kidney transplant status: Secondary | ICD-10-CM

## 2019-01-20 ENCOUNTER — Encounter: Admit: 2019-01-20 | Discharge: 2019-01-20 | Payer: MEDICARE

## 2019-01-20 MED ORDER — TACROLIMUS 0.5 MG PO CAP
1 mg | ORAL_CAPSULE | Freq: Two times a day (BID) | ORAL | 3 refills | 30.00000 days | Status: DC
Start: 2019-01-20 — End: 2019-05-01
  Filled 2019-03-20: qty 360, 30d supply, fill #2

## 2019-01-20 NOTE — Telephone Encounter
Prograf level drawn 01/17/19 below trough goal at 4.7 on 0.5 mg am and 1 mg pm. Per Dr. Abram Sander, patient asked to increase dose to 1 mg bid.  Reviewed with patient's husband, Clair Gulling, who repeated dose adjustment to NC.

## 2019-01-23 ENCOUNTER — Encounter: Admit: 2019-01-23 | Discharge: 2019-01-23 | Payer: MEDICARE

## 2019-01-23 MED FILL — PREDNISONE 5 MG PO TAB: 5 mg | ORAL | 90 days supply | Qty: 90 | Fill #5 | Status: AC

## 2019-01-23 MED FILL — NIFEDIPINE 30 MG PO TR24: 30 mg | ORAL | 90 days supply | Qty: 180 | Fill #2 | Status: AC

## 2019-01-25 ENCOUNTER — Encounter: Admit: 2019-01-25 | Discharge: 2019-01-25 | Payer: MEDICARE

## 2019-01-25 DIAGNOSIS — Z5181 Encounter for therapeutic drug level monitoring: Secondary | ICD-10-CM

## 2019-01-25 DIAGNOSIS — Z940 Kidney transplant status: Secondary | ICD-10-CM

## 2019-01-25 LAB — CBC AND DIFF
Lab: 0
Lab: 0.1
Lab: 0.5 (ref <20.7)
Lab: 1 10*3/uL (ref 0–0.80)
Lab: 1.4 10*3/uL — ABNORMAL LOW (ref 1.9–8.1)
Lab: 1.5 K/UL (ref 1.0–4.8)
Lab: 10 g/dL — ABNORMAL LOW (ref 12.0–16.0)
Lab: 103 K/UL — ABNORMAL HIGH (ref 80.0–99.0)
Lab: 12 10*3/uL — ABNORMAL HIGH (ref 0.0–10.0)
Lab: 120 % — ABNORMAL LOW (ref 130–400)
Lab: 14 % — ABNORMAL LOW (ref 11.5–14.5)
Lab: 30 % — ABNORMAL HIGH (ref 30.0–34.0)
Lab: 30 % — ABNORMAL LOW (ref >60)
Lab: 31 pg — ABNORMAL HIGH (ref 27.0–31.0)
Lab: 33 % — ABNORMAL LOW (ref 37.0–47.0)
Lab: 4.5 x10-3/uL — ABNORMAL LOW (ref 4.8–10.8)
Lab: 55 % — ABNORMAL HIGH (ref >60)

## 2019-01-25 LAB — COMPREHENSIVE METABOLIC PANEL
Lab: 15 — ABNORMAL HIGH (ref 0–14)
Lab: 109 mmol/L — ABNORMAL HIGH (ref 98–107)
Lab: 139 mmol/L (ref 136–145)
Lab: 20 — ABNORMAL LOW (ref 22–29)

## 2019-01-25 LAB — URINALYSIS DIPSTICK REFLEX TO CULTURE

## 2019-01-25 LAB — URIC ACID: Lab: 7.3 mg/dL — ABNORMAL HIGH (ref 2.6–6.0)

## 2019-01-25 LAB — MAGNESIUM: Lab: 1.9 mg/dL (ref 1.6–2.6)

## 2019-01-25 LAB — URINALYSIS MICROSCOPIC REFLEX TO CULTURE

## 2019-01-25 NOTE — Progress Notes
Per patient's husband, he works on Thursdays and is unable to get patient to see Dr. Abram Sander those days for long term clinic.  Patient to be transferred to Dr. Verline Lema for long term follow up and is scheduled for 04/04/19.

## 2019-01-30 ENCOUNTER — Encounter: Admit: 2019-01-30 | Discharge: 2019-01-30 | Payer: MEDICARE

## 2019-01-30 DIAGNOSIS — Z940 Kidney transplant status: Secondary | ICD-10-CM

## 2019-01-30 DIAGNOSIS — Z5181 Encounter for therapeutic drug level monitoring: Secondary | ICD-10-CM

## 2019-01-30 DIAGNOSIS — B259 Cytomegaloviral disease, unspecified: Secondary | ICD-10-CM

## 2019-01-30 LAB — CMV QUANT PCR-BLOOD
Lab: 3.7 — ABNORMAL HIGH (ref <2.30)
Lab: 601 — ABNORMAL HIGH (ref <200)

## 2019-01-30 LAB — TACROLIMUS IMMUNOASSAY (FK506): Lab: 6.1 ug/L (ref 5.0–20.0)

## 2019-01-30 MED FILL — SULFAMETHOXAZOLE-TRIMETHOPRIM 400-80 MG PO TAB: 80/400 mg | ORAL | 30 days supply | Qty: 30 | Fill #6 | Status: CP

## 2019-01-31 ENCOUNTER — Encounter: Admit: 2019-01-31 | Discharge: 2019-01-31 | Payer: MEDICARE

## 2019-01-31 MED ORDER — VALGANCICLOVIR 450 MG PO TAB
450 mg | ORAL_TABLET | Freq: Every day | ORAL | 11 refills | Status: DC
Start: 2019-01-31 — End: 2019-05-01
  Filled 2019-01-31: qty 30, 30d supply, fill #1

## 2019-01-31 MED ORDER — MYCOPHENOLATE SODIUM 180 MG PO TBEC
360 mg | ORAL_TABLET | Freq: Two times a day (BID) | ORAL | 3 refills | Status: DC
Start: 2019-01-31 — End: 2019-05-01

## 2019-02-02 ENCOUNTER — Encounter: Admit: 2019-02-02 | Discharge: 2019-02-02 | Payer: MEDICARE

## 2019-02-02 DIAGNOSIS — Z940 Kidney transplant status: Secondary | ICD-10-CM

## 2019-02-02 DIAGNOSIS — Z5181 Encounter for therapeutic drug level monitoring: Secondary | ICD-10-CM

## 2019-02-02 LAB — MAGNESIUM: Lab: 1.8 mg/dL (ref 1.6–2.6)

## 2019-02-02 LAB — URINALYSIS DIPSTICK REFLEX TO CULTURE
Lab: NEGATIVE
Lab: 0.2
Lab: NEGATIVE
Lab: NEGATIVE
Lab: NEGATIVE
Lab: NEGATIVE

## 2019-02-02 LAB — COMPREHENSIVE METABOLIC PANEL
Lab: 122 — ABNORMAL HIGH (ref 70–105)
Lab: 14
Lab: 2.2 — ABNORMAL HIGH (ref 0.57–1.11)
Lab: 24 — AB (ref >59)
Lab: 3.7
Lab: 37 — ABNORMAL HIGH (ref 9.8–20.1)
Lab: 0.2 mg/dL (ref 0.2–1.2)
Lab: 139 mmol/L (ref 136–145)
Lab: 158 U/L — ABNORMAL HIGH (ref 40–150)
Lab: 21 U/L
Lab: 29 U/L (ref 0–55)
Lab: 6.6 g/dL (ref 6.4–8.3)
Lab: 9.2 mg/dL (ref 8.4–10.2)

## 2019-02-02 LAB — CBC AND DIFF
Lab: 0.9
Lab: 1.1
Lab: 1.9
Lab: 102 — ABNORMAL HIGH (ref 80.0–99.0)
Lab: 11 — ABNORMAL HIGH (ref 0.0–10.0)
Lab: 128 — ABNORMAL LOW (ref 130–400)
Lab: 36 — ABNORMAL LOW (ref 40.0–75.0)
Lab: 50 — ABNORMAL HIGH (ref 18.0–47.0)
Lab: 10 g/dL — ABNORMAL LOW (ref 12.0–16.0)
Lab: 13 % (ref 11.5–14.5)
Lab: 2.6 10*3/uL (ref 0.9–5.1)
Lab: 3.3 uL — ABNORMAL LOW (ref 4.20–5.40)
Lab: 30 % (ref 30.0–34.0)
Lab: 31 pg — ABNORMAL HIGH (ref 27.0–31.0)
Lab: 34 % — ABNORMAL LOW (ref 37.0–47.0)
Lab: 5.1 uL (ref 4.8–10.8)

## 2019-02-02 LAB — URINALYSIS MICROSCOPIC REFLEX TO CULTURE

## 2019-02-02 LAB — URIC ACID: Lab: 7.2 mg/dL — ABNORMAL HIGH (ref 2.6–6.0)

## 2019-02-02 LAB — PHOSPHORUS: Lab: 3.8 mmol/L — ABNORMAL HIGH (ref 98–107)

## 2019-02-02 LAB — PROTEIN/CR RATIO,UR RAN: Lab: 0.2 mg/mg — ABNORMAL HIGH (ref <0.2)

## 2019-02-03 ENCOUNTER — Encounter: Admit: 2019-02-03 | Discharge: 2019-02-03 | Payer: MEDICARE

## 2019-02-03 DIAGNOSIS — Z5181 Encounter for therapeutic drug level monitoring: Secondary | ICD-10-CM

## 2019-02-03 DIAGNOSIS — Z940 Kidney transplant status: Secondary | ICD-10-CM

## 2019-02-03 LAB — TACROLIMUS LC-MS/MS: Lab: 11

## 2019-02-03 NOTE — Progress Notes
Pharmacy Managed Valganciclovir   Michele Benitez is status-post deceased kidney transplant on 18-Sep-2018. Patient is on valganciclovir for treatment of CMV.       CMV risk stratification:   Moderate Risk (CMV D+/R+)       Duration of valganciclovir therapy:   TBD by CMV viremia course    Expected end date of therapy: TBD      Laboratory Results  CBC w diff  CBC with Diff Latest Ref Rng & Units 01/31/2019 01/24/2019 01/17/2019 01/10/2019 01/02/2019   WBC 4.8 - 10.8 uL 5.1 4.5(L) 4.0(L) 4.6(L) 3.0(L)   RBC 4.20 - 5.40 uL 3.38(L) 3.30(L) 3.26(L) 3.28(L) 3.22(L)   HGB 12.0 - 16.0 g/dL 10.6(L) 10.3(L) 10.1(L) 10.3(L) 10.2(L)   HCT 37.0 - 47.0 % 34.6(L) 33.9(L) 34.0(L) 34.1(L) 33.0(L)   MCV 80.0 - 99.0 102.0(H) 103.0(H) 104.0(H) 104.0(H) 102.0(H)   MCH 27.0 - 31.0 pG 31.4(H) 31.2(H) 31.0 31.5(H) 31.6(H)   MCHC 30.0 - 34.0 % 30.8 30.4 29.7(L) 30.2 30.8   RDW 11.5 - 14.5 % 13.4 14.4 14.8(H) 13.9 13.6   PLT 130 - 400 128(L) 120(L) 115(L) 116(L) 110(L)   MPV 7 - 11 FL - - - - -   NEUT 40.0 - 75.0 36.4(L) 30.2(L) - 40.3 67   ANC - 1.9 1.4(L) - 1.8(L) 2.0   LYMA 24 - 44 % - - - - -   ALYM 0.9 - 5.1 x10-3/uL 2.6 2.5 - 2.0 0.5(L)   MONA 4 - 12 % - - - - -   AMONO - 0.6 0.5 - 0.6 0.3   EOSA 0 - 5 % - - - - -   AEOS - 0.0 0.1 - 0.1 0.0   BASA 0 - 2 % - - - - -   ABAS - 0.1 0.0 - 0.1 0.0       Comprehensive Metabolic Profile  CMP Latest Ref Rng & Units 01/31/2019 01/24/2019 01/17/2019 01/10/2019 01/02/2019   NA 136 - 145 mmol/L 139 139 136 142 141   K 3.5 - 5.1 mmol/L 4.5 4.7 5.1 4.8 4.2   CL 98 - 107 mmol/L 108(H) 109(H) 105 109(H) 107   CO2 22 - 29 mmol/L 22.0 20.0(L) 20.0(L) 21.0(L) 23.0   GAP - 14 15(H) 16(H) 17(H) 15(H)   BUN 9.8 - 20.1 37.0(H) 36.0(H) 31.0(H) 21.0(H) 18.0   CR 0.57 - 1.11 2.23(H) 2.09(H) 1.96(H) 2.11(H) 2.04(H)   GLUX 70 - 100 MG/DL - - - - -   CA 8.4 - 16.1 mg/dL 9.2 9.5 9.2 9.7 9.7   TP 6.4 - 8.3 g/dL 6.6 6.7 6.5 6.8 6.9   ALB - 3.7 3.7 3.5 3.7 4.0   ALKP 40 - 150 U/L 158(H) 197(H) 231(H) 206(H) 134 ALT 0 - 55 U/L 29 27 34 33 34   TBILI 0.2 - 1.2 mg/dL 0.96 0.45 4.09 8.11 9.14   GFR >59 24.2(A) 26.0(A) 28.0(L) 25.8(A) 26.8(L)   GFRAA >60 mL/min - - - - -       No results found for: CMVLOG10  CMV DNA Quant PCR   Date Value Ref Range Status   01/24/2019 3.78 (H) <2.30 Final       Calculated creatinine clearance based on Cockcroft-Gault Equation: 30.1 ml/min    Assessment / Plan:      Renal function: Worsened    Thrombocytopenia: None  Neutropenia: None    CMV Viremia:   Yes, 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 Transplant coordinator will communicate any dose changes with patient.  Next laboratory check per post-transplant protocol.    Follow up: with next laboratory check or earlier if needed

## 2019-02-06 ENCOUNTER — Encounter: Admit: 2019-02-06 | Discharge: 2019-02-06 | Payer: MEDICARE

## 2019-02-06 MED FILL — CARVEDILOL 25 MG PO TAB: 25 mg | ORAL | 30 days supply | Qty: 60 | Fill #5 | Status: AC

## 2019-02-08 ENCOUNTER — Encounter: Admit: 2019-02-08 | Discharge: 2019-02-08 | Payer: MEDICARE

## 2019-02-08 DIAGNOSIS — Z940 Kidney transplant status: Secondary | ICD-10-CM

## 2019-02-08 DIAGNOSIS — Z5181 Encounter for therapeutic drug level monitoring: Secondary | ICD-10-CM

## 2019-02-08 LAB — COMPREHENSIVE METABOLIC PANEL
Lab: 136 — ABNORMAL HIGH (ref 70–105)
Lab: 2.2 — ABNORMAL HIGH (ref 0.57–1.11)
Lab: 23 — AB (ref >59)
Lab: 33 — ABNORMAL HIGH (ref 9.8–20.1)
Lab: 4
Lab: 0.3 mg/dL (ref 0.2–1.2)
Lab: 139 mmol/L (ref 136–145)
Lab: 145 U/L (ref 40–150)
Lab: 18 U/L
Lab: 21 mmol/L — ABNORMAL LOW (ref 22–29)
Lab: 22 U/L (ref 0–55)
Lab: 4.5 mmol/L (ref 3.5–5.1)
Lab: 6.8 g/dL (ref 6.4–8.3)
Lab: 9.3 mg/dL (ref 8.4–10.2)

## 2019-02-08 LAB — URINALYSIS DIPSTICK REFLEX TO CULTURE
Lab: 0.2
Lab: NEGATIVE
Lab: NEGATIVE
Lab: NEGATIVE — ABNORMAL HIGH (ref 18.0–47.0)
Lab: NEGATIVE — ABNORMAL LOW (ref 40.0–75.0)

## 2019-02-08 LAB — CBC AND DIFF
Lab: 0
Lab: 0.1
Lab: 0.1
Lab: 1
Lab: 3.1
Lab: 6.1

## 2019-02-08 LAB — URINALYSIS MICROSCOPIC REFLEX TO CULTURE

## 2019-02-08 LAB — MAGNESIUM: Lab: 1.9 mg/dL (ref 1.6–2.6)

## 2019-02-08 LAB — URIC ACID: Lab: 8 mg/dL — ABNORMAL HIGH (ref 2.6–6.0)

## 2019-02-08 NOTE — Progress Notes
Pharmacy Managed Valganciclovir   Michele Benitez is status-post deceased kidney transplant on September 28, 2018. Patient is on valganciclovir for treatment of CMV.       CMV risk stratification:   Moderate Risk (CMV D+/R+)     Duration of valganciclovir therapy:   TBD by CMV viremia course    Expected end date of therapy: TBD      Laboratory Results  CBC w diff  CBC with Diff Latest Ref Rng & Units 02/07/2019 01/31/2019 01/24/2019 01/17/2019 01/10/2019   WBC - 6.1 5.1 4.5(L) 4.0(L) 4.6(L)   RBC 4.20 - 5.40 3.53(L) 3.38(L) 3.30(L) 3.26(L) 3.28(L)   HGB 12.0 - 16.0 11.4(L) 10.6(L) 10.3(L) 10.1(L) 10.3(L)   HCT 37.0 - 47.0 35.1(L) 34.6(L) 33.9(L) 34.0(L) 34.1(L)   MCV 80.0 - 99.0 99.7(H) 102.0(H) 103.0(H) 104.0(H) 104.0(H)   MCH 27.0 - 31.0 32.2(H) 31.4(H) 31.2(H) 31.0 31.5(H)   MCHC - 32.3 30.8 30.4 29.7(L) 30.2   RDW - 13.3 13.4 14.4 14.8(H) 13.9   PLT - 132 128(L) 120(L) 115(L) 116(L)   MPV 7 - 11 FL - - - - -   NEUT 40.0 - 75.0 38.0(L) 36.4(L) 30.2(L) - 40.3   ANC - 2.3 1.9 1.4(L) - 1.8(L)   LYMA 24 - 44 % - - - - -   ALYM - 3.1 2.6 2.5 - 2.0   MONA 4 - 12 % - - - - -   AMONO - 0 6 0.6 0.5 - 0.6   EOSA 0 - 5 % - - - - -   AEOS - 0.1 0.0 0.1 - 0.1   BASA 0 - 2 % - - - - -   ABAS - 0.1 0.1 0.0 - 0.1       Comprehensive Metabolic Profile  CMP Latest Ref Rng & Units 02/07/2019 01/31/2019 01/24/2019 01/17/2019 01/10/2019   NA 136 - 145 mmol/L 139 139 139 136 142   K 3.5 - 5.1 mmol/L 4.5 4.5 4.7 5.1 4.8   CL 98 - 107 mmol/L 106 108(H) 109(H) 105 109(H)   CO2 22 - 29 mmol/L 21.0(L) 22.0 20.0(L) 20.0(L) 21.0(L)   GAP 0 - 14 17(H) 14 15(H) 16(H) 17(H)   BUN 9.8 - 20.1 33.0(H) 37.0(H) 36.0(H) 31.0(H) 21.0(H)   CR 0.57 - 1.11 2.25(H) 2.23(H) 2.09(H) 1.96(H) 2.11(H)   GLUX 70 - 100 MG/DL - - - - -   CA 8.4 - 91.4 mg/dL 9.3 9.2 9.5 9.2 9.7   TP 6.4 - 8.3 g/dL 6.8 6.6 6.7 6.5 6.8   ALB - 4.0 3.7 3.7 3.5 3.7   ALKP 40 - 150 U/L 145 158(H) 197(H) 231(H) 206(H)   ALT 0 - 55 U/L 22 29 27  34 33 TBILI 0.2 - 1.2 mg/dL 7.82 9.56 2.13 0.86 5.78   GFR >59 23.9(A) 24.2(A) 26.0(A) 28.0(L) 25.8(A)   GFRAA >60 mL/min - - - - -       No results found for: CMVLOG10  CMV DNA Quant PCR   Date Value Ref Range Status   01/24/2019 3.78 (H) <2.30 Final       Calculated creatinine clearance based on Cockcroft-Gault Equation: 29.8 ml/min    Assessment / Plan:      Renal function: Stable    Thrombocytopenia: None  Neutropenia: None    CMV Viremia:   Yes    Valganciclovir Dose:   Continue Valcyte 450mg  daily for CMV viremia treatment      Notified transplant nephrologist and renal transplant coordinator. Transplant coordinator will  communicate any dose changes with patient.  Next laboratory check per post-transplant protocol.    Follow up: with next laboratory check or earlier if needed

## 2019-02-13 ENCOUNTER — Encounter: Admit: 2019-02-13 | Discharge: 2019-02-13 | Payer: MEDICARE

## 2019-02-13 DIAGNOSIS — B259 Cytomegaloviral disease, unspecified: Secondary | ICD-10-CM

## 2019-02-13 LAB — CMV QUANT PCR-BLOOD: Lab: <20

## 2019-02-14 ENCOUNTER — Encounter: Admit: 2019-02-14 | Discharge: 2019-02-14 | Payer: MEDICARE

## 2019-02-14 DIAGNOSIS — Z940 Kidney transplant status: Secondary | ICD-10-CM

## 2019-02-14 DIAGNOSIS — Z5181 Encounter for therapeutic drug level monitoring: Secondary | ICD-10-CM

## 2019-02-14 LAB — URINALYSIS DIPSTICK REFLEX TO CULTURE
Lab: 0.2
Lab: NEGATIVE
Lab: NEGATIVE
Lab: NEGATIVE
Lab: NEGATIVE
Lab: NEGATIVE
Lab: NEGATIVE

## 2019-02-14 LAB — URINALYSIS MICROSCOPIC REFLEX TO CULTURE

## 2019-02-14 LAB — PROTEIN/CR RATIO,UR RAN: Lab: 25 — ABNORMAL LOW (ref 47–110)

## 2019-02-23 ENCOUNTER — Encounter: Admit: 2019-02-23 | Discharge: 2019-02-23 | Payer: MEDICARE

## 2019-02-23 DIAGNOSIS — Z5181 Encounter for therapeutic drug level monitoring: Secondary | ICD-10-CM

## 2019-02-23 DIAGNOSIS — Z940 Kidney transplant status: Secondary | ICD-10-CM

## 2019-02-23 LAB — URINALYSIS MICROSCOPIC REFLEX TO CULTURE

## 2019-02-23 LAB — URINALYSIS DIPSTICK REFLEX TO CULTURE
Lab: 0.2
Lab: 7
Lab: NEGATIVE
Lab: NEGATIVE
Lab: NEGATIVE

## 2019-02-28 ENCOUNTER — Encounter: Admit: 2019-02-28 | Discharge: 2019-02-28 | Payer: MEDICARE

## 2019-02-28 DIAGNOSIS — Z940 Kidney transplant status: Secondary | ICD-10-CM

## 2019-02-28 DIAGNOSIS — Z5181 Encounter for therapeutic drug level monitoring: Secondary | ICD-10-CM

## 2019-02-28 LAB — PHOSPHORUS: Lab: 4.4 U/L — ABNORMAL HIGH (ref 7–40)

## 2019-02-28 LAB — COMPREHENSIVE METABOLIC PANEL
Lab: 120 U/L (ref 0–0.20)
Lab: 133 mg/dL — ABNORMAL HIGH (ref >60)

## 2019-02-28 LAB — CBC AND DIFF
Lab: 0.1
Lab: 1.2 U/L (ref 0–0.45)
Lab: 3.7 % — ABNORMAL LOW (ref 4.20–5.40)
Lab: 37 mmol/L — ABNORMAL HIGH (ref 98–107)
Lab: 4.6 g/dL — ABNORMAL LOW (ref 4.8–10.8)

## 2019-02-28 LAB — URINALYSIS DIPSTICK REFLEX TO CULTURE
Lab: NEGATIVE
Lab: 0.2
Lab: NEGATIVE U/L (ref 0–0.80)
Lab: NEGATIVE g/dL
Lab: NEGATIVE g/dL

## 2019-02-28 LAB — PROTEIN/CR RATIO,UR RAN: Lab: 32 meq/L — ABNORMAL LOW (ref 47–110)

## 2019-02-28 LAB — URIC ACID: Lab: 7.2 mmol/L — ABNORMAL HIGH (ref 2.6–6.0)

## 2019-03-01 ENCOUNTER — Encounter: Admit: 2019-03-01 | Discharge: 2019-03-01 | Payer: MEDICARE

## 2019-03-01 NOTE — Progress Notes
Pharmacy Managed Valganciclovir   Michele Benitez is status-post deceased kidney transplant on 2018/09/30. Patient is on valganciclovir for prophylaxis of CMV after initial transplant.       CMV risk stratification:   Moderate Risk (CMV D+/R+)     Duration of valganciclovir therapy:   TBD by CMV viremia     Expected end date of therapy: TBD      Laboratory Results  CBC w diff  CBC with Diff Latest Ref Rng & Units 02/28/2019 02/07/2019 01/31/2019 01/24/2019 01/17/2019   WBC 4.8 - 10.8 4.6(L) 6.1 5.1 4.5(L) 4.0(L)   RBC 4.20 - 5.40 3.79(L) 3.53(L) 3.38(L) 3.30(L) 3.26(L)   HGB - 12.0 11.4(L) 10.6(L) 10.3(L) 10.1(L)   HCT - 37.9 35.1(L) 34.6(L) 33.9(L) 34.0(L)   MCV 80.0 - 99.0 100.0(H) 99.7(H) 102.0(H) 103.0(H) 104.0(H)   MCH 27.0 - 31.0 31.5(H) 32.2(H) 31.4(H) 31.2(H) 31.0   MCHC - 31.5 32.3 30.8 30.4 29.7(L)   RDW - 13.6 13.3 13.4 14.4 14.8(H)   PLT - 158 132 128(L) 120(L) 115(L)   MPV 7 - 11 FL - - - - -   NEUT - 45.7 38.0(L) 36.4(L) 30.2(L) -   ANC - 2.1 2.3 1.9 1.4(L) -   LYMA 24 - 44 % - - - - -   ALYM - 2.1 3.1 2.6 2.5 -   MONA 4 - 12 % - - - - -   AMONO - 0.2 0 6 0.6 0.5 -   EOSA 0 - 5 % - - - - -   AEOS - 0.1 0.1 0.0 0.1 -   BASA 0 - 2 % - - - - -   ABAS - 0.1 0.1 0.1 0.0 -       Comprehensive Metabolic Profile  CMP Latest Ref Rng & Units 02/28/2019 02/07/2019 01/31/2019 01/24/2019 01/17/2019   NA mmol/L 140 139 139 139 136   K mmol/L 4.6 4.5 4.5 4.7 5.1   CL 98 - 107 mmol/L 108(H) 106 108(H) 109(H) 105   CO2 mmol/L 22.0 21.0(L) 22.0 20.0(L) 20.0(L)   GAP 0 - 14 MEQ/L 15(H) 17(H) 14 15(H) 16(H)   BUN 9.8 - 20.1 mg/dL 36.0(H) 33.0(H) 37.0(H) 36.0(H) 31.0(H)   CR 0.57 - 1.11 mg/dL 9.81(X) 9.14(N) 8.29(F) 2.09(H) 1.96(H)   GLUX 70 - 100 MG/DL - - - - -   CA mg/dL 9.7 9.3 9.2 9.5 9.2   TP g/dL 7.0 6.8 6.6 6.7 6.5   ALB g/dL 4.1 4.0 3.7 3.7 3.5   ALKP U/L 120 145 158(H) 197(H) 231(H)   ALT U/L 16 22 29 27  34   TBILI 0.2 - 1.2 mg/dL - 6.21 3.08 6.57 8.46   GFR >59 mL/Min/1.73 26.5(L) 23.9(A) 24.2(A) 26.0(A) 28.0(L) GFRAA >60 mL/min - - - - -       No results found for: CMVLOG10  CMV DNA Quant PCR   Date Value Ref Range Status   02/07/2019 <2.30 Log IU/mL Final       Calculated creatinine clearance based on Cockcroft-Gault Equation: 32.3 ml/min      Assessment / Plan:      Renal function: Improved    Thrombocytopenia: None  Neutropenia: None    CMV Viremia:   No, CMV PCR 11/17 <200, 01/24/2019 6,016 IU/ml    Valganciclovir Dose:   Continue Valcyte 450mg  daily for CMV viremia treatment      Notified transplant nephrologist and renal transplant coordinator. Transplant coordinator will communicate any dose changes with patient.  Next laboratory  check per post-transplant protocol.    Follow up: with next laboratory check or earlier if needed

## 2019-03-06 ENCOUNTER — Encounter: Admit: 2019-03-06 | Discharge: 2019-03-06 | Payer: MEDICARE

## 2019-03-06 MED FILL — CARVEDILOL 25 MG PO TAB: 25 mg | ORAL | 30 days supply | Qty: 60 | Fill #6 | Status: AC

## 2019-03-06 MED FILL — PANTOPRAZOLE 40 MG PO TBEC: 40 mg | ORAL | 90 days supply | Qty: 180 | Fill #3 | Status: AC

## 2019-03-06 MED FILL — VALGANCICLOVIR 450 MG PO TAB: 450 mg | ORAL | 30 days supply | Qty: 30 | Fill #2 | Status: AC

## 2019-03-06 MED FILL — SULFAMETHOXAZOLE-TRIMETHOPRIM 400-80 MG PO TAB: 80/400 mg | ORAL | 30 days supply | Qty: 30 | Fill #7 | Status: AC

## 2019-03-09 ENCOUNTER — Encounter: Admit: 2019-03-09 | Discharge: 2019-03-09 | Payer: MEDICARE

## 2019-03-09 DIAGNOSIS — Z940 Kidney transplant status: Secondary | ICD-10-CM

## 2019-03-09 DIAGNOSIS — Z5181 Encounter for therapeutic drug level monitoring: Secondary | ICD-10-CM

## 2019-03-09 LAB — TACROLIMUS IMMUNOASSAY (FK506): Lab: 9.2 mg/dL — ABNORMAL HIGH (ref <100)

## 2019-03-14 ENCOUNTER — Encounter: Admit: 2019-03-14 | Discharge: 2019-03-14 | Payer: MEDICARE

## 2019-03-14 MED FILL — NIFEDIPINE 30 MG PO TR24: 30 mg | ORAL | 90 days supply | Qty: 180 | Fill #3 | Status: AC

## 2019-03-20 ENCOUNTER — Encounter: Admit: 2019-03-20 | Discharge: 2019-03-20 | Payer: MEDICARE

## 2019-03-22 ENCOUNTER — Encounter: Admit: 2019-03-22 | Discharge: 2019-03-22 | Payer: MEDICARE

## 2019-03-30 ENCOUNTER — Encounter: Admit: 2019-03-30 | Discharge: 2019-03-30 | Payer: MEDICARE

## 2019-03-30 DIAGNOSIS — Z940 Kidney transplant status: Secondary | ICD-10-CM

## 2019-03-30 DIAGNOSIS — Z5181 Encounter for therapeutic drug level monitoring: Secondary | ICD-10-CM

## 2019-03-30 LAB — TACROLIMUS IMMUNOASSAY (FK506): Lab: 10 ug/L (ref 5.0–20.0)

## 2019-03-31 ENCOUNTER — Encounter: Admit: 2019-03-31 | Discharge: 2019-03-31 | Payer: MEDICARE

## 2019-03-31 DIAGNOSIS — Z940 Kidney transplant status: Secondary | ICD-10-CM

## 2019-03-31 DIAGNOSIS — N390 Urinary tract infection, site not specified: Secondary | ICD-10-CM

## 2019-03-31 DIAGNOSIS — Z5181 Encounter for therapeutic drug level monitoring: Secondary | ICD-10-CM

## 2019-03-31 DIAGNOSIS — B259 Cytomegaloviral disease, unspecified: Secondary | ICD-10-CM

## 2019-04-04 ENCOUNTER — Encounter: Admit: 2019-04-04 | Discharge: 2019-04-04 | Payer: MEDICARE

## 2019-04-04 ENCOUNTER — Ambulatory Visit: Admit: 2019-04-04 | Discharge: 2019-04-04 | Payer: MEDICARE

## 2019-04-04 DIAGNOSIS — N2581 Secondary hyperparathyroidism of renal origin: Secondary | ICD-10-CM

## 2019-04-04 DIAGNOSIS — Z5181 Encounter for therapeutic drug level monitoring: Secondary | ICD-10-CM

## 2019-04-04 DIAGNOSIS — R9389 Abnormal findings on diagnostic imaging of other specified body structures: Secondary | ICD-10-CM

## 2019-04-04 DIAGNOSIS — B259 Cytomegaloviral disease, unspecified: Secondary | ICD-10-CM

## 2019-04-04 DIAGNOSIS — R9439 Abnormal result of other cardiovascular function study: Secondary | ICD-10-CM

## 2019-04-04 DIAGNOSIS — E162 Hypoglycemia, unspecified: Secondary | ICD-10-CM

## 2019-04-04 DIAGNOSIS — Z940 Kidney transplant status: Secondary | ICD-10-CM

## 2019-04-04 DIAGNOSIS — N189 Chronic kidney disease, unspecified: Secondary | ICD-10-CM

## 2019-04-04 DIAGNOSIS — E785 Hyperlipidemia, unspecified: Secondary | ICD-10-CM

## 2019-04-04 DIAGNOSIS — I10 Essential (primary) hypertension: Secondary | ICD-10-CM

## 2019-04-04 DIAGNOSIS — N186 End stage renal disease: Secondary | ICD-10-CM

## 2019-04-04 LAB — URINALYSIS MICROSCOPIC REFLEX TO CULTURE

## 2019-04-04 LAB — URINALYSIS DIPSTICK REFLEX TO CULTURE
Lab: NEGATIVE mg/dL
Lab: 1 (ref 1.003–1.035)
Lab: 6 (ref 5.0–8.0)
Lab: NEGATIVE
Lab: NEGATIVE
Lab: NEGATIVE
Lab: NEGATIVE

## 2019-04-04 LAB — CBC AND DIFF
Lab: 0 10*3/uL (ref 0–0.20)
Lab: 0 K/UL (ref 0–0.45)
Lab: 0.4 K/UL (ref 0–0.80)
Lab: 1 % (ref 0–2)
Lab: 1 % (ref 0–5)
Lab: 12 g/dL (ref 12.0–15.0)
Lab: 14 % (ref 11–15)
Lab: 150 10*3/uL (ref 150–400)
Lab: 2.3 10*3/uL (ref 1.0–4.8)
Lab: 2.8 10*3/uL (ref 1.8–7.0)
Lab: 31 pg (ref 26–34)
Lab: 33 g/dL (ref 32.0–36.0)
Lab: 41 % (ref 24–44)
Lab: 49 % (ref 41–77)
Lab: 5.6 K/UL (ref 4.5–11.0)
Lab: 7.1 FL (ref 7–11)
Lab: 8 % (ref 4–12)
Lab: 95 FL — ABNORMAL LOW (ref >60)

## 2019-04-04 LAB — COMPREHENSIVE METABOLIC PANEL
Lab: 0.4 mg/dL (ref 0.3–1.2)
Lab: 1.8 mg/dL — ABNORMAL HIGH (ref 0.4–1.00)
Lab: 10 mg/dL (ref 8.5–10.6)
Lab: 104 MMOL/L (ref 98–110)
Lab: 110 U/L (ref 25–110)
Lab: 120 mg/dL — ABNORMAL HIGH (ref 70–100)
Lab: 139 MMOL/L (ref 137–147)
Lab: 14 U/L (ref 7–40)
Lab: 14 U/L (ref 7–56)
Lab: 28 mL/min — ABNORMAL LOW (ref >60)
Lab: 29 mg/dL — ABNORMAL HIGH (ref 7–25)
Lab: 4.4 MMOL/L (ref 3.5–5.1)
Lab: 4.9 g/dL (ref 3.5–5.0)
Lab: 7.6 g/dL (ref 6.0–8.0)

## 2019-04-04 LAB — FOLATE, SERUM: Lab: 11 ng/mL (ref >3.9)

## 2019-04-04 LAB — MAGNESIUM: Lab: 2 mg/dL (ref 1.6–2.6)

## 2019-04-04 LAB — HEMOGLOBIN A1C: Lab: 6.2 % — ABNORMAL HIGH (ref 4.0–6.0)

## 2019-04-04 LAB — PARATHYROID HORMONE: Lab: 76 pg/mL — ABNORMAL HIGH (ref 10–65)

## 2019-04-04 LAB — THYROID STIMULATING HORMONE-TSH: Lab: 1.6 uU/mL (ref 0.35–5.00)

## 2019-04-04 LAB — 25-OH VITAMIN D (D2 + D3): Lab: 24 ng/mL — ABNORMAL LOW (ref 30–80)

## 2019-04-04 LAB — PROTEIN/CR RATIO,UR RAN: Lab: 0.3

## 2019-04-04 LAB — PHOSPHORUS: Lab: 3.7 mg/dL (ref 2.0–4.5)

## 2019-04-04 MED ORDER — METFORMIN 500 MG PO TB24
500 mg | ORAL_TABLET | Freq: Every day | ORAL | 3 refills | Status: DC
Start: 2019-04-04 — End: 2019-05-01

## 2019-04-04 NOTE — Progress Notes
Please check Vitamin B 12, folate , TSH , hemoglobin A1 C , Vitamin D and PTH   Check CMV and d/c valcyte if negative   Pt given external lab order.    Start metformin 500 mg XR .  Script sent to local pharmacy  Please give compression socks.  Pt will get these from [facility]

## 2019-04-04 NOTE — Progress Notes
Vitals:    04/04/19 0758   BP: 129/73   Pulse: 85   Temp:    SpO2:

## 2019-04-04 NOTE — Progress Notes
Transplant Nephrology Clinic Progress Note    Date of Service: 04/04/2019 8:01 AM       04/04/2019   Theresa Mulligan  DOB: 02-16-1961  MRN: [id number]      CHRYSTINA PARGA is a 59 y.o. 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 She has been on dialysis since August 28, 2014  ?  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 Warnecke in our clinic visit, she was last seen in clinic on 12/27/2018 and has been doing well since that time.    She is here today with her husband. \    She complains of leg swelling , worse as the day progresses, denies shortness of breath on exertion or PND  . She also reports being forgetful of recent , concerned about short term memory issues trying to remember things.     She complains that taking valcyte makes her feel nauseous.     Her home BPs range from 130s-140s/80. She has also noticed she has gained some weight in the last few months. She used to weigh 77 kg , now 90 kg.     She drink about 5 glasses of fluid daily, her energy levels are good.     Average urine output of 1.5-1.6L/daily and remains on KCitrate given abnormal Litholink and stone in allograft. She reports similar oral intake.    She denies N/V/D, fever or chills.      She reports adherence with her immunosuppression regimen.    Comprehensive 14-point ROS:  All systems reviewed and negative except as outlined in HPI       Pertinent positive and negative systems are noted above and/or in HPI         MEDS: ? acetaminophen (TYLENOL) 500 mg tablet Take 500 mg by mouth every 6 hours as needed for Pain. Max of 4,000 mg of acetaminophen in 24 hours.   ? aspirin EC 81 mg tablet Do not take until directed by transplant clinic   ? carvediloL (COREG) 25 mg tablet Take one tablet by mouth twice daily.   ? mycophenolate DR (MYFORTIC) 180 mg TbEC tablet Take two tablets by mouth twice daily.   ? NIFEdipine XL (PROCARDIA-XL) 30 mg tablet Take two tablets by mouth daily.   ? pantoprazole DR (PROTONIX) 40 mg tablet Take one tablet by mouth twice daily.   ? potassium citrate (UROCIT-K) 10 mEq (1,080 mg) tablet Take two tablets by mouth twice daily. Take with food.   ? predniSONE (DELTASONE) 5 mg tablet Take one tablet by mouth daily with breakfast.   ? simvastatin (ZOCOR) 20 mg tablet Take 20 mg by mouth at bedtime daily.   ? tacrolimus (PROGRAF) 0.5 mg capsule Take two capsules by mouth twice daily.   ? trimethoprim/sulfamethoxazole (BACTRIM) 80/400 mg tablet Take one tablet by mouth daily.   ? valGANciclovir (VALCYTE) 450 mg tablet Take one tablet by mouth daily with breakfast.  No Known Allergies    PMHx:  Medical History:   Diagnosis Date   ? Abnormal stress test 12/03/2015   ? Dyslipidemia 07/18/2014   ? ESRD (end stage renal disease) (HCC) 12/03/2015   ? Hyperlipemia    ? Hyperphosphatemia 07/18/2014   ? Hypertension 07/18/2014   Cardiac cath in Sept 2017 with minimal plaque of RCA and LAD  ?  PSHx:  Surgical History:   Procedure Laterality Date   ? Left Heart Catheterization With Ventriculogram Left 12/03/2015    Performed by Harley Alto, MD at [facility] CATH LAB   ? Coronary Angiography N/A 12/03/2015    Performed by Harley Alto, MD at [facility] CATH LAB   ? Possible Percutaneous Coronary Intervention N/A 12/03/2015    Performed by Harley Alto, MD at [facility] CATH LAB   ? ALLOTRANSPLANTATION KIDNEY FROM NON LIVING DONOR WITHOUT RECIPIENT NEPHRECTOMY N/A 09/01/2018    Performed by York Cerise, MD at [facility] OR RUE wrist fistula?s/p ligation on 04/20/2016  LUE AV fistula?11/29/2015    SHx:    Social History     Socioeconomic History   ? Marital status: Married     Spouse name: Not on file   ? Number of children: 2   ? Years of education: Not on file   ? Highest education level: Not on file   Occupational History   ? Not on file   Tobacco Use   ? Smoking status: Never Smoker   ? Smokeless tobacco: Never Used   Substance and Sexual Activity   ? Alcohol use: No   ? Drug use: No   ? Sexual activity: Not on file   Other Topics Concern   ? Not on file   Social History Narrative   ? Not on file   Blood transfusions: none  ?  FHx:  Family History   Problem Relation Age of Onset   ? Heart Failure Mother    ? Alcohol abuse Mother    ? Cancer Father         colon   Cancer: no breast cancer; no colon cancer      Objective:           Vitals:    04/04/19 0756 04/04/19 0758   BP: 130/75 129/73   BP Source: Arm, Right Upper Arm, Right Upper   Patient Position: Sitting Standing   Pulse: 84 85   Temp: 36.3 ?C (97.4 ?F)    SpO2: 97%    Weight: 90.7 kg (200 lb)    Height: 167.6 cm (66)    PainSc: Zero      Body mass index is 32.28 kg/m?Marland Kitchen     Physical Exam:    General: middle aged woman, NAD, A+Ox4, calm and pleasant.   HENT: PEERL, Unremarkable, no oral lesions  Neck: Normal ROM, no LAD, no JVD  Lungs: Bilat. CTA  CV: RRR, S1, S2 without carotid bruit or murmur, 2+ edema  Abdomen: Soft, N/D, N/T without hepatosplenomegaly  Incision: Healed  M/S: Normal ROM and strength  Neuro: Nonfocal deficits without tremors  Skin: No skin rash  Psyc: Stable      Labs and Diagnostic Tests:  Urinalysis:  Lab Results   Component Value Date/Time    UCOLOR Yellow 02/28/2019 08:45 AM    TURBID Clear 02/28/2019 08:45 AM    USPGR 1.020 02/28/2019 08:45 AM    UPH 6.5 02/28/2019 08:45 AM    UPROTEIN Negative 02/28/2019 08:45 AM    UAGLU Negative 02/28/2019  08:45 AM    UKET Negative 02/28/2019 08:45 AM    UBILE Negative 02/28/2019 08:45 AM UBLD Negative 02/28/2019 08:45 AM    UROB 0.2 02/28/2019 08:45 AM       CBC with Diff:  CBC with Diff Latest Ref Rng & Units 02/28/2019 02/07/2019   WBC 4.8 - 10.8 4.6(L) 6.1   RBC 4.20 - 5.40 3.79(L) 3.53(L)   HGB - 12.0 11.4(L)   HCT - 37.9 35.1(L)   MCV 80.0 - 99.0 100.0(H) 99.7(H)   MCH 27.0 - 31.0 31.5(H) 32.2(H)   MCHC - 31.5 32.3   RDW - 13.6 13.3   PLT - 158 132   MPV 7 - 11 FL - -   NEUT - 45.7 38.0(L)   ANC - 2.1 2.3   LYMA 24 - 44 % - -   ALYM - 2.1 3.1   MONA 4 - 12 % - -   AMONO - 0.2 0 6   EOSA 0 - 5 % - -   AEOS - 0.1 0.1   BASA 0 - 2 % - -   ABAS - 0.1 0.1       CMP:  CMP Latest Ref Rng & Units 02/28/2019 02/07/2019   NA mmol/L 140 139   K mmol/L 4.6 4.5   CL 98 - 107 mmol/L 108(H) 106   CO2 mmol/L 22.0 21.0(L)   GAP 0 - 14 MEQ/L 15(H) 17(H)   BUN 9.8 - 20.1 mg/dL 36.0(H) 33.0(H)   CR 0.57 - 1.11 mg/dL 1.61(W) 9.60(A)   GLUX 70 - 100 MG/DL - -   CA mg/dL 9.7 9.3   TP g/dL 7.0 6.8   ALB g/dL 4.1 4.0   ALKP U/L 540 145   ALT U/L 16 22   TBILI 0.2 - 1.2 mg/dL - 9.81   GFR >19 JY/NWG/9.56 26.5(L) 23.9(A)   GFRAA >60 mL/min - -       CMV DNA Quant PCR   Date Value   02/07/2019 <2.30 Log IU/mL   01/24/2019 3.78 (H)   12/27/2018 PERFORMED AT [facility] [IU]/mL (A)     BK Virus Plasma Quant (no units)   Date Value   11/15/2018     NOT DETECTED  Reference range: NOT DETECTED  Unit: copies/mL  .  Assay Range: 39 copies/mL to 1.00E+10 copies/mL  .  The limit of quantitation (LOQ) is 39 copies/mL. BK virus DNA  detected below the  LOQ will be reported as Detected:<39 copies/mL.  .  This test was developed and its performance characteristics  determined by  [employer]. It has not been cleared or approved by the [place]  Food and Drug  Administration. Results should be used in conjunction with clinical  findings,  and should not form the sole basis for a diagnosis or treatment  decision.  ____________________________________________________________  Testing Performed At:  [facility]  [address] [place], [place] [address]  [phone]  CLIA ID: [id number]     10/18/2018     NOT DETECTED  Reference range: NOT DETECTED  Unit: copies/mL  .  Assay Range: 39 copies/mL to 1.00E+10 copies/mL  .  The limit of quantitation (LOQ) is 39 copies/mL. BK virus DNA  detected below the  LOQ will be reported as Detected:<39 copies/mL.  .  This test was developed and its performance characteristics  determined by  [employer]. It has not been cleared or approved by the U.S.  Food and Drug  Administration. Results should  be used in conjunction with clinical  findings,  and should not form the sole basis for a diagnosis or treatment  decision.  ____________________________________________________________  Testing Performed At:  [facility]  [address]  [place], [place] [address]  [phone]  CLIA ID: [id number]     10/04/2018     NOT DETECTED  Reference range: NOT DETECTED  Unit: copies/mL  .  Assay Range: 39 copies/mL to 1.00E+10 copies/mL  .  The limit of quantitation (LOQ) is 39 copies/mL. BK virus DNA  detected below the  LOQ will be reported as Detected:<39 copies/mL.  .  This test was developed and its performance characteristics  determined by  [employer]. It has not been cleared or approved by the U.S.  Food and Drug  Administration. Results should be used in conjunction with clinical  findings,  and should not form the sole basis for a diagnosis or treatment  decision.  ____________________________________________________________  Testing Performed At:  [facility]  [address]  [place], [place] [address]  [phone]  CLIA ID: [id number]       No results found for: [facility]    Other Common Labs:  Other Common Labs Latest Ref Rng & Units 03/28/2019 02/28/2019   IRON 50 - 160 MCG/DL - -   TIBC 027 - 253 MCG/DL - -   PSAT 28 - 42 % - -   FER 10 - 200 NG/ML - -   PO4 - - 4.4   MG - - 1.9   UPRO - - Negative   UCRR 47 - 110 - 32.39(L)   UPCRC <0.2 - -   URICA 2.6 - 6.0 - 7.2(H) HBA1C 4.0 - 6.0 % - -   Tacrolimus 5.0 - 20.0 mcg/L 10.8 9.2         Tacrolimus LC-MS/MS   Date Value   01/31/2019 11.2   01/17/2019 4.7 mcg/L (L)   12/27/2018 14.4   11/29/2018 5.8   11/15/2018 19.0       Imaging: Renal USN (10/25/18)    The right lower quad transplant kidney measures 11.1 cm in length. ?There   is mild renal pelviectasis without frank hydronephrosis. Nephroureteral   stent is in place. Multiple renal calculi are seen in the mid and lower   transplant kidney. The intrarenal resistive indices range from 0.66-0.70.   The main renal artery and vein appear patent. Maximum main renal artery   velocity is 177 cm/s, previously reaching 420 cm/s at the anastomosis..   The main renal artery appears tortuous.     The right iliac artery and vein appear patent. The maximum iliac artery   velocity is 112 cm/s.    There is a perinephric fluid collection or possible seroma along the   medial and inferior aspect of the transplant kidney measuring 4.2 x 4.2 x   7.3 cm. This compares to 5.5 x 2.6 x 8.2 cm on the previous study.   Additional subincisional fluid collection is seen measuring 12.1 x 2.7 cm,   previously 11.8 x 3.5 cm.    The bladder is decompressed.    Renal USN (11/15/2018):  1. ?Mild dilatation of the renal collecting system following   nephroureteral stent removal. This may be due to mild ureteral stricture   or possibly recent/occult ureteral calculus. If the patient is managed   conservatively, recommend repeat renal ultrasound in 1-2 days to evaluate   for persistence or resolution.   2. ?Patent renal  vasculature. No evidence of large vessel compromise of   the graft. Decrease in size of perinephric and subincisional fluid   collections.   3. ?Nephrolithiasis.     Assessment and Plan:    LALA BYAM is a 59 y.o. 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  ?  #) Immunosuppression:   on tacro/MPA/prednisone; Aiming for Tacro levels between 5-10 ng/ml by LCMS , MPA 360 mg bid (due to leukopenia)  and prednisone 5 levels pending      #) Kidney function: slow graft function  Scr today at 1.84 mg/dL best creatinine    No DSAs with recent check 10/20; allosure <0.12   UA shows 1+ protein, U Pr/Cr ratio 0.3     #) HTN: Controlled on Nifedipine to 60 mg daily. Continue Coreg 25 mg BID as well.    #) Infection:   CMV D+/R+ completed 3 months of Valcyte   CMV viremia - asymptomatic , on valcyte since November, we will recheck levels again, if negative , ok to d/c valcyte   Continue 12 months of Bactrim for CMV and PCP/UTI prophylaxis     #)  Normocytic Anemia: resolved,   last Hgb 12.0 0n 02/28/19 last dose of Aranesp 11/2018. Vit B 12 , folate normal     #) Hyperuricemia: watch for now; UA should improve as renal function improves    #)CAD: mild; continue on asa, Zocor and Coreg    #) Impaired glucose tolerance  Has consistent fasting glucose >126,  Hemoglobin A1c today is 6.2 likely falsely low due to recent anemia , epo use.   We discussed lifestyle modification, she will need referral for diabetic nutrition. Start metformin 500 mg XR daily. Lifestyle modification reiterated.     #) Nephrolithiasis: found in transplanted kidney on imaging from 11/15/18. At risk of stone formation due to low urine citrate excretion on litholink done on 8/28.   Cont Potassium Citrate 20 meq BID due to abnormal Litholink     #) CKD MBD   PTH down to 76.5 today from 90.2 on 6/11   Vitamin D insufficiency present, Vit D today is 24.2   Will continue Vitamin D 5000 iu daily      #) Leg swelling:   Likely due to due to chronic venous stasis vs Nifedipine side effect  Will give compression socks,   Low salt diet encouraged.     #) Forgetfulness   Pt is concerned for memory loss, metabolic work up (TSH , Vitamin B12, folate levels within normal limits), ? Tac related. We will follow up in subsequent visits and consider cognitive behavioral therapy. General health maint. Managed by PCP   Skin cancer surveillance: Plan to refer to Dermatology for skin eval due to long term immunosuppression   Cancer screening: Mammogram, Pap Smear, colonoscopy per guidelines. Annual flu vaccine    Avoid all live vaccines. We do not recommend Shingrix vaccine due to evidence of rejection shown in renal transplant patients     RTC 1 months via telehealth   Labs weekly       Anselm Lis, MD      Cc: [name]  CC: [place], Christopher Giang is appropriate for kidney transplant nursing management of immunosuppression medications and laboratory values. The goal is to maintain a therapeutic effect and to decrease the chance of medication nephrotoxicity, rejection and infection while maintaining good allograft function.

## 2019-04-10 ENCOUNTER — Encounter: Admit: 2019-04-10 | Discharge: 2019-04-10 | Payer: MEDICARE

## 2019-04-11 ENCOUNTER — Encounter: Admit: 2019-04-11 | Discharge: 2019-04-11 | Payer: MEDICARE

## 2019-04-11 DIAGNOSIS — D849 Immunodeficiency, unspecified: Secondary | ICD-10-CM

## 2019-04-11 DIAGNOSIS — Z940 Kidney transplant status: Secondary | ICD-10-CM

## 2019-04-11 MED FILL — CARVEDILOL 25 MG PO TAB: 25 mg | ORAL | 30 days supply | Qty: 60 | Fill #7 | Status: AC

## 2019-04-11 MED FILL — SULFAMETHOXAZOLE-TRIMETHOPRIM 400-80 MG PO TAB: 80/400 mg | ORAL | 30 days supply | Qty: 30 | Fill #8 | Status: AC

## 2019-04-13 ENCOUNTER — Encounter: Admit: 2019-04-13 | Discharge: 2019-04-13 | Payer: MEDICARE

## 2019-04-13 DIAGNOSIS — R739 Hyperglycemia, unspecified: Secondary | ICD-10-CM

## 2019-04-24 ENCOUNTER — Encounter: Admit: 2019-04-24 | Discharge: 2019-04-24 | Payer: MEDICARE

## 2019-04-24 MED FILL — TACROLIMUS 0.5 MG PO CAP: 0.5 mg | ORAL | 30 days supply | Qty: 360 | Fill #3 | Status: AC

## 2019-04-28 ENCOUNTER — Encounter: Admit: 2019-04-28 | Discharge: 2019-04-28 | Payer: MEDICARE

## 2019-04-28 DIAGNOSIS — Z006 Encounter for examination for normal comparison and control in clinical research program: Secondary | ICD-10-CM

## 2019-05-01 ENCOUNTER — Encounter: Admit: 2019-05-01 | Discharge: 2019-05-01 | Payer: MEDICARE

## 2019-05-01 ENCOUNTER — Ambulatory Visit: Admit: 2019-05-01 | Discharge: 2019-05-01 | Payer: MEDICARE

## 2019-05-01 DIAGNOSIS — Z5181 Encounter for therapeutic drug level monitoring: Secondary | ICD-10-CM

## 2019-05-01 DIAGNOSIS — B348 Other viral infections of unspecified site: Secondary | ICD-10-CM

## 2019-05-01 DIAGNOSIS — B259 Cytomegaloviral disease, unspecified: Secondary | ICD-10-CM

## 2019-05-01 DIAGNOSIS — R9439 Abnormal result of other cardiovascular function study: Secondary | ICD-10-CM

## 2019-05-01 DIAGNOSIS — E785 Hyperlipidemia, unspecified: Secondary | ICD-10-CM

## 2019-05-01 DIAGNOSIS — N390 Urinary tract infection, site not specified: Secondary | ICD-10-CM

## 2019-05-01 DIAGNOSIS — N186 End stage renal disease: Secondary | ICD-10-CM

## 2019-05-01 DIAGNOSIS — Z940 Kidney transplant status: Secondary | ICD-10-CM

## 2019-05-01 DIAGNOSIS — I10 Essential (primary) hypertension: Secondary | ICD-10-CM

## 2019-05-01 DIAGNOSIS — Z006 Encounter for examination for normal comparison and control in clinical research program: Secondary | ICD-10-CM

## 2019-05-01 LAB — URINALYSIS DIPSTICK REFLEX TO CULTURE
Lab: 1 (ref 1.003–1.035)
Lab: 6 (ref 5.0–8.0)
Lab: NEGATIVE
Lab: NEGATIVE
Lab: NEGATIVE
Lab: NEGATIVE
Lab: NEGATIVE
Lab: NEGATIVE

## 2019-05-01 LAB — URINALYSIS MICROSCOPIC REFLEX TO CULTURE

## 2019-05-01 LAB — COMPREHENSIVE METABOLIC PANEL
Lab: 0.4 mg/dL (ref 0.3–1.2)
Lab: 1.7 mg/dL — ABNORMAL HIGH (ref 0.4–1.00)
Lab: 10 mg/dL (ref 8.5–10.6)
Lab: 106 MMOL/L (ref 98–110)
Lab: 106 U/L (ref 25–110)
Lab: 142 MMOL/L (ref 137–147)
Lab: 145 mg/dL — ABNORMAL HIGH (ref 70–100)
Lab: 35 mg/dL — ABNORMAL HIGH (ref 7–25)
Lab: 4.3 MMOL/L (ref 3.5–5.1)
Lab: 5.1 g/dL — ABNORMAL HIGH (ref 3.5–5.0)
Lab: 7.7 g/dL (ref 6.0–8.0)

## 2019-05-01 LAB — PROTEIN/CR RATIO,UR RAN
Lab: 21 mg/dL
Lab: 54 mg/dL
Lab: 0.4

## 2019-05-01 LAB — BK VIRUS DNA, QUANT PLASMA
Lab: 260 K/UL (ref 0–0.20)
Lab: 2.9
Lab: 832

## 2019-05-01 LAB — CBC AND DIFF
Lab: 1 % (ref 0–2)
Lab: 1 % (ref 0–5)
Lab: 13 g/dL (ref 12.0–15.0)
Lab: 14 % (ref 11–15)
Lab: 150 10*3/uL (ref 150–400)
Lab: 2.3 10*3/uL (ref 1.0–4.8)
Lab: 3.1 10*3/uL (ref 1.8–7.0)
Lab: 32 pg — ABNORMAL LOW (ref >60)
Lab: 33 g/dL (ref 32.0–36.0)
Lab: 39 % (ref 24–44)
Lab: 4.1 M/UL (ref 4.0–5.0)
Lab: 40 % — ABNORMAL HIGH (ref 36–45)
Lab: 5.9 10*3/uL (ref 4.5–11.0)
Lab: 53 % (ref 41–77)
Lab: 6 % (ref 4–12)
Lab: 7.9 FL (ref 7–11)
Lab: 96 FL — ABNORMAL LOW (ref >60)

## 2019-05-01 LAB — RESEARCH BLOOD COLLECTION ONLY

## 2019-05-01 LAB — URIC ACID: Lab: 7 mg/dL (ref 2.0–7.0)

## 2019-05-01 LAB — PHOSPHORUS: Lab: 3.7 mg/dL (ref 2.0–4.5)

## 2019-05-01 LAB — MAGNESIUM: Lab: 1.9 mg/dL (ref 1.6–2.6)

## 2019-05-01 MED FILL — NIFEDIPINE 30 MG PO TR24: 30 mg | ORAL | 10 days supply | Qty: 180 | Status: CN

## 2019-05-01 MED FILL — NIFEDIPINE 60 MG PO TR24: 60 mg | ORAL | 30 days supply | Qty: 30 | Fill #1 | Status: CP

## 2019-05-01 MED FILL — TACROLIMUS 1 MG PO TB24: 1 mg | ORAL | 30 days supply | Qty: 60 | Fill #1 | Status: CP

## 2019-05-01 MED FILL — LISINOPRIL 5 MG PO TAB: 5 mg | ORAL | 30 days supply | Qty: 30 | Fill #1 | Status: CP

## 2019-05-01 MED FILL — POTASSIUM CITRATE 10 MEQ (1,080 MG) PO TBER: 10 mEq (1,080 mg) | ORAL | 45 days supply | Qty: 180 | Fill #1 | Status: CP

## 2019-05-01 MED FILL — PREDNISONE 5 MG PO TAB: 5 mg | ORAL | 90 days supply | Qty: 90 | Fill #6 | Status: CP

## 2019-05-02 ENCOUNTER — Encounter: Admit: 2019-05-02 | Discharge: 2019-05-02 | Payer: MEDICARE

## 2019-05-02 DIAGNOSIS — B348 Other viral infections of unspecified site: Secondary | ICD-10-CM

## 2019-05-02 LAB — TACROLIMUS LC-MS/MS: Lab: 13

## 2019-05-02 NOTE — Telephone Encounter
Orders rec'd from Dr. Verline Lema for pt to keep Myfortic on hold at this time r/t BK virus.   TC to pt to discuss medication. She wrote down the medication with proper spelling to let her husband know as well that she is to hold the medication.

## 2019-05-03 ENCOUNTER — Encounter: Admit: 2019-05-03 | Discharge: 2019-05-03 | Payer: MEDICARE

## 2019-05-03 NOTE — Telephone Encounter
TC from pt re: concerned about her BP. It's been running in the 770H systolic in the am and pm. She stated that she took another Nifedipine in the evening yesterday to decrease the BP, and it went down to 133/77. We reviewed her medications. She wasn't sure if she was taking 30mg  or 60mg  of the Nifedipine in the mornings, and stated that she would check when she got back home. I advised for her to check the dosage of the Nifedipine. If she'd only been taking 30mg , then to increase to 60mg  as prescribed. We also reviewed carvedilol rx and lisinopril. She will check dosages when she's at home and let us know if they are correct. I advised to please check BP for two days if she was not taking 60mg  in the morning prior to the call, then call me on Friday with BP readings. Pt and spouse v/u of the conversation.

## 2019-05-08 ENCOUNTER — Encounter: Admit: 2019-05-08 | Discharge: 2019-05-08 | Payer: MEDICARE

## 2019-05-08 MED FILL — SULFAMETHOXAZOLE-TRIMETHOPRIM 400-80 MG PO TAB: 80/400 mg | ORAL | 30 days supply | Qty: 30 | Fill #9 | Status: AC

## 2019-05-09 ENCOUNTER — Encounter: Admit: 2019-05-09 | Discharge: 2019-05-09 | Payer: MEDICARE

## 2019-05-09 MED ORDER — TACROLIMUS 0.5 MG PO CAP
1 mg | ORAL_CAPSULE | Freq: Two times a day (BID) | ORAL | 3 refills
Start: 2019-05-09 — End: ?

## 2019-05-12 ENCOUNTER — Encounter: Admit: 2019-05-12 | Discharge: 2019-05-12 | Payer: MEDICARE

## 2019-05-15 ENCOUNTER — Encounter: Admit: 2019-05-15 | Discharge: 2019-05-15 | Payer: MEDICARE

## 2019-05-15 DIAGNOSIS — D849 Immunodeficiency, unspecified: Secondary | ICD-10-CM

## 2019-05-15 DIAGNOSIS — Z940 Kidney transplant status: Secondary | ICD-10-CM

## 2019-05-15 MED ORDER — CARVEDILOL 25 MG PO TAB
25 mg | ORAL_TABLET | Freq: Two times a day (BID) | ORAL | 6 refills | Status: CN
Start: 2019-05-15 — End: ?

## 2019-05-15 MED ORDER — NIFEDIPINE 60 MG PO TR24
60 mg | ORAL_TABLET | Freq: Every day | ORAL | 11 refills | 30.00000 days | Status: AC
Start: 2019-05-15 — End: ?

## 2019-05-15 MED ORDER — CARVEDILOL 25 MG PO TAB
25 mg | ORAL_TABLET | Freq: Two times a day (BID) | ORAL | 6 refills | 90.00000 days | Status: AC
Start: 2019-05-15 — End: ?

## 2019-05-15 NOTE — Telephone Encounter
TC from pt requesting refills be sent to [facility] in [place]. Refills sent per pt's request.

## 2019-05-22 ENCOUNTER — Encounter: Admit: 2019-05-22 | Discharge: 2019-05-22 | Payer: MEDICARE

## 2019-05-22 MED FILL — TACROLIMUS 1 MG PO TB24: 1 mg | ORAL | 30 days supply | Qty: 60 | Fill #2 | Status: AC

## 2019-05-24 ENCOUNTER — Encounter: Admit: 2019-05-24 | Discharge: 2019-05-24 | Payer: MEDICARE

## 2019-05-24 MED FILL — LISINOPRIL 5 MG PO TAB: 5 mg | ORAL | 30 days supply | Qty: 30 | Fill #2 | Status: AC

## 2019-05-25 ENCOUNTER — Encounter: Admit: 2019-05-25 | Discharge: 2019-05-25 | Payer: MEDICARE

## 2019-05-29 ENCOUNTER — Encounter: Admit: 2019-05-29 | Discharge: 2019-05-29 | Payer: MEDICARE

## 2019-05-29 DIAGNOSIS — Z940 Kidney transplant status: Secondary | ICD-10-CM

## 2019-05-29 DIAGNOSIS — D849 Immunodeficiency, unspecified: Secondary | ICD-10-CM

## 2019-05-29 MED ORDER — MYCOPHENOLATE SODIUM 180 MG PO TBEC
180 mg | ORAL_TABLET | Freq: Two times a day (BID) | ORAL | 3 refills
Start: 2019-05-29 — End: ?

## 2019-05-30 ENCOUNTER — Encounter: Admit: 2019-05-30 | Discharge: 2019-05-30 | Payer: MEDICARE

## 2019-05-31 ENCOUNTER — Encounter: Admit: 2019-05-31 | Discharge: 2019-05-31 | Payer: MEDICARE

## 2019-05-31 DIAGNOSIS — Z940 Kidney transplant status: Secondary | ICD-10-CM

## 2019-05-31 DIAGNOSIS — D849 Immunodeficiency, unspecified: Secondary | ICD-10-CM

## 2019-05-31 LAB — CBC AND DIFF
Lab: 0.1
Lab: 0.6
Lab: 0.8
Lab: 1.1
Lab: 102 — ABNORMAL HIGH (ref 80.0–99.0)
Lab: 119 — ABNORMAL LOW (ref 130–400)
Lab: 12
Lab: 46
Lab: 7.3
Lab: 0.1 10*3/uL (ref 0.0–0.6)
Lab: 13 % (ref 11.5–14.5)
Lab: 3.2 10*3/uL (ref 0.9–5.1)
Lab: 3.4 10*3/uL
Lab: 3.8 uL — ABNORMAL LOW (ref 4.20–5.40)
Lab: 31 % (ref 30.0–34.0)
Lab: 32 pg — ABNORMAL HIGH (ref 27.0–31.0)
Lab: 39 % (ref 37.0–47.0)
Lab: 43 % (ref 8.0–47.0)
Lab: 8.8 % (ref 0.0–10.0)

## 2019-06-01 ENCOUNTER — Encounter: Admit: 2019-06-01 | Discharge: 2019-06-01 | Payer: MEDICARE

## 2019-06-01 LAB — MAGNESIUM: Lab: 1.9

## 2019-06-01 LAB — URIC ACID: Lab: 6.9 — ABNORMAL HIGH (ref 2.6–6.0)

## 2019-06-01 LAB — URINALYSIS MICROSCOPIC REFLEX TO CULTURE

## 2019-06-01 LAB — COMPREHENSIVE METABOLIC PANEL
Lab: 139 mmol/L
Lab: 4.9 mmol/L

## 2019-06-01 LAB — PHOSPHORUS: Lab: 3.8

## 2019-06-01 LAB — URINALYSIS DIPSTICK REFLEX TO CULTURE

## 2019-06-01 LAB — PROTEIN/CR RATIO,UR RAN: Lab: 41 — ABNORMAL LOW (ref 47–110)

## 2019-06-05 ENCOUNTER — Encounter: Admit: 2019-06-05 | Discharge: 2019-06-05 | Payer: MEDICARE

## 2019-06-05 MED FILL — SULFAMETHOXAZOLE-TRIMETHOPRIM 400-80 MG PO TAB: 80/400 mg | ORAL | 30 days supply | Qty: 30 | Fill #10 | Status: AC

## 2019-06-09 ENCOUNTER — Encounter: Admit: 2019-06-09 | Discharge: 2019-06-09 | Payer: MEDICARE

## 2019-06-09 DIAGNOSIS — Z006 Encounter for examination for normal comparison and control in clinical research program: Secondary | ICD-10-CM

## 2019-06-12 ENCOUNTER — Encounter: Admit: 2019-06-12 | Discharge: 2019-06-12 | Payer: MEDICARE

## 2019-06-12 MED FILL — POTASSIUM CITRATE 10 MEQ (1,080 MG) PO TBER: 10 mEq (1,080 mg) | ORAL | 45 days supply | Qty: 360 | Fill #2 | Status: AC

## 2019-06-13 ENCOUNTER — Encounter: Admit: 2019-06-13 | Discharge: 2019-06-13 | Payer: MEDICARE

## 2019-06-13 ENCOUNTER — Ambulatory Visit: Admit: 2019-06-13 | Discharge: 2019-06-13 | Payer: MEDICARE

## 2019-06-13 DIAGNOSIS — E785 Hyperlipidemia, unspecified: Secondary | ICD-10-CM

## 2019-06-13 DIAGNOSIS — B348 Other viral infections of unspecified site: Secondary | ICD-10-CM

## 2019-06-13 DIAGNOSIS — D849 Immunodeficiency, unspecified: Secondary | ICD-10-CM

## 2019-06-13 DIAGNOSIS — Z006 Encounter for examination for normal comparison and control in clinical research program: Secondary | ICD-10-CM

## 2019-06-13 DIAGNOSIS — E139 Other specified diabetes mellitus without complications: Secondary | ICD-10-CM

## 2019-06-13 DIAGNOSIS — E559 Vitamin D deficiency, unspecified: Secondary | ICD-10-CM

## 2019-06-13 DIAGNOSIS — N390 Urinary tract infection, site not specified: Secondary | ICD-10-CM

## 2019-06-13 DIAGNOSIS — Z5181 Encounter for therapeutic drug level monitoring: Secondary | ICD-10-CM

## 2019-06-13 DIAGNOSIS — I10 Essential (primary) hypertension: Secondary | ICD-10-CM

## 2019-06-13 DIAGNOSIS — Z940 Kidney transplant status: Secondary | ICD-10-CM

## 2019-06-13 DIAGNOSIS — B259 Cytomegaloviral disease, unspecified: Secondary | ICD-10-CM

## 2019-06-13 DIAGNOSIS — R9439 Abnormal result of other cardiovascular function study: Secondary | ICD-10-CM

## 2019-06-13 DIAGNOSIS — N186 End stage renal disease: Secondary | ICD-10-CM

## 2019-06-13 LAB — CBC AND DIFF
Lab: 0 10*3/uL (ref 0–0.20)
Lab: 0 10*3/uL (ref 0–0.45)
Lab: 0.5 10*3/uL (ref 0–0.80)
Lab: 1 % (ref 0–2)
Lab: 1 % (ref 0–5)
Lab: 10 % (ref 4–12)
Lab: 11 g/dL — ABNORMAL LOW (ref 12.0–15.0)
Lab: 13 % (ref 11–15)
Lab: 130 10*3/uL — ABNORMAL LOW (ref 150–400)
Lab: 2 10*3/uL (ref 1.8–7.0)
Lab: 3.3 10*3/uL (ref 1.0–4.8)
Lab: 3.5 M/UL — ABNORMAL LOW (ref 4.0–5.0)
Lab: 33 g/dL (ref 32.0–36.0)
Lab: 33 pg (ref 26–34)
Lab: 34 % — ABNORMAL LOW (ref 41–77)
Lab: 35 % — ABNORMAL LOW (ref >60)
Lab: 54 % — ABNORMAL HIGH (ref 24–44)
Lab: 6.1 10*3/uL (ref 4.5–11.0)
Lab: 7.2 FL (ref 7–11)
Lab: 97 FL — ABNORMAL LOW (ref >60)

## 2019-06-13 LAB — COMPREHENSIVE METABOLIC PANEL
Lab: 0.5 mg/dL (ref 0.3–1.2)
Lab: 1.8 mg/dL — ABNORMAL HIGH (ref 0.4–1.00)
Lab: 10 mg/dL (ref 8.5–10.6)
Lab: 105 MMOL/L (ref 98–110)
Lab: 126 mg/dL — ABNORMAL HIGH (ref 70–100)
Lab: 14 U/L (ref 7–40)
Lab: 140 MMOL/L (ref 137–147)
Lab: 38 mg/dL — ABNORMAL HIGH (ref 7–25)
Lab: 4.7 g/dL (ref 3.5–5.0)
Lab: 5.1 MMOL/L (ref 3.5–5.1)
Lab: 6.9 g/dL (ref 6.0–8.0)
Lab: 89 U/L (ref 25–110)

## 2019-06-13 LAB — URIC ACID: Lab: 6.9 mg/dL (ref 2.0–7.0)

## 2019-06-13 LAB — URINALYSIS DIPSTICK REFLEX TO CULTURE
Lab: 1 (ref 1.003–1.035)
Lab: 6 (ref 5.0–8.0)
Lab: NEGATIVE
Lab: NEGATIVE
Lab: NEGATIVE
Lab: NEGATIVE
Lab: NEGATIVE
Lab: NEGATIVE
Lab: NEGATIVE

## 2019-06-13 LAB — MAGNESIUM: Lab: 1.8 mg/dL (ref 1.6–2.6)

## 2019-06-13 LAB — BK VIRUS DNA, QUANT PLASMA
Lab: 180
Lab: 2.7
Lab: 576

## 2019-06-13 LAB — URINALYSIS MICROSCOPIC REFLEX TO CULTURE

## 2019-06-13 LAB — TACROLIMUS LC-MS/MS: Lab: 7.9

## 2019-06-13 LAB — PHOSPHORUS: Lab: 3.5 mg/dL (ref 2.0–4.5)

## 2019-06-13 LAB — PROTEIN/CR RATIO,UR RAN
Lab: 51 mg/dL
Lab: 9 mg/dL
Lab: 0.2

## 2019-06-13 LAB — RESEARCH BLOOD COLLECTION ONLY

## 2019-06-13 MED ORDER — METFORMIN 500 MG PO TB24
1000 mg | ORAL_TABLET | Freq: Every day | ORAL | 3 refills | Status: DC
Start: 2019-06-13 — End: 2019-09-19
  Filled 2019-06-13: qty 180, 90d supply, fill #1

## 2019-06-13 MED ORDER — CHOLECALCIFEROL (VITAMIN D3) 125 MCG (5,000 UNIT) PO TAB
5000 [IU] | ORAL_TABLET | Freq: Every day | ORAL | 11 refills | 30.00000 days | Status: CN
Start: 2019-06-13 — End: ?

## 2019-06-13 NOTE — Progress Notes
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     HPI/Interval history:    Michele Benitez is a 59 y.o. 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 She is seen today for a follow up visit in the [place] [facility].  She is here today with her husband.     Since the last visit on 05/01/19. She continues to have tiredness after about an hr after taking medications in the morning     Blood pressure range 108 - 130s/80s does not check with symptoms when she feels tireds.     Taking laps around her house, yet to take the Covid vaccines     Memory / forgetfulness about the same. Drinking a lot more water about 80 -90 oz ounces of water 3 glases. No issues with medication adherence.          Allergy:  No Known Allergies    Medications:  ? acetaminophen (TYLENOL) 500 mg tablet Take 500 mg by mouth every 6 hours as needed for Pain. Max of 4,000 mg of acetaminophen in 24 hours.   ? aspirin EC 81 mg tablet Do not take until directed by transplant clinic   ? carvediloL (COREG) 25 mg tablet Take one tablet by mouth twice daily.   ? lisinopriL (ZESTRIL) 5 mg tablet Take one tablet by mouth at bedtime daily.   ? metFORMIN-XR (GLUCOPHAGE XR) 500 mg extended release tablet Take two tablets by mouth daily with dinner.   ? NIFEdipine XL (PROCARDIA-XL) 60 mg tablet Take one tablet by mouth daily.   ? pantoprazole DR (PROTONIX) 40 mg tablet Take one tablet by mouth daily.   ? potassium citrate (UROCIT-K) 10 mEq (1,080 mg) tablet Take two tablets by mouth twice daily. Take with food.   ? predniSONE (DELTASONE) 5 mg tablet Take one tablet by mouth daily with breakfast.   ? simvastatin (ZOCOR) 20 mg tablet Take 20 mg by mouth at bedtime daily.   ? tacrolimus XR (ENVARSUS XR) 1 mg tablet Take two tablets by mouth daily. Indications: prevent kidney transplant rejection   ? trimethoprim/sulfamethoxazole (BACTRIM) 80/400 mg tablet Take one tablet by mouth daily.       Past Medical History:  Medical History:   Diagnosis Date   ? Abnormal stress test 12/03/2015   ? Dyslipidemia 07/18/2014   ? ESRD (end stage renal disease) (HCC) 12/03/2015   ? Hyperlipemia    ? Hyperphosphatemia 07/18/2014   ? Hypertension 07/18/2014       Surgical History:  Surgical History:   Procedure Laterality Date   ? Left Heart Catheterization With Ventriculogram Left 12/03/2015    Performed by Harley Alto, MD at [facility] CATH LAB   ? Coronary Angiography N/A 12/03/2015    Performed by Harley Alto, MD at [facility] CATH LAB   ?  Possible Percutaneous Coronary Intervention N/A 12/03/2015    Performed by Harley Alto, MD at [facility] CATH LAB   ? ALLOTRANSPLANTATION KIDNEY FROM NON LIVING DONOR WITHOUT RECIPIENT NEPHRECTOMY N/A 09/01/2018    Performed by York Cerise, MD at [facility] OR   -    Review of Systems: 10 out of 14 systems are negative except for those noted in HPI      Family History:  Family History   Problem Relation Age of Onset   ? Heart Failure Mother    ? Alcohol abuse Mother    ? Cancer Father         colon   -      Social History:  Social History     Socioeconomic History   ? Marital status: Married     Spouse name: Not on file   ? Number of children: 2   ? Years of education: Not on file   ? Highest education level: Not on file   Occupational History   ? Not on file   Tobacco Use   ? Smoking status: Never Smoker   ? Smokeless tobacco: Never Used   Substance and Sexual Activity   ? Alcohol use: No   ? Drug use: No   ? Sexual activity: Not on file   Other Topics Concern   ? Not on file   Social History Narrative   ? Not on file    -      Objective:      Vitals:    06/13/19 0812 06/13/19 0814   BP: (!) 145/75 121/70   BP Source: Arm, Right Upper Arm, Right Upper   Patient Position: Sitting Standing   Pulse: 75 83   SpO2: 94%    Weight: 89.9 kg (198 lb 3.2 oz)    Height: 167.6 cm (66)    PainSc: Zero      Body mass index is 31.99 kg/m?Marland Kitchen     Physical Exam    General: well built, well groomed, appears comfortable in chair, not in acute distress. Conversant.  EYE: no icterus, no conjunctival injection, redness, discharge  Throat: no pharyngeal erythema  Neuro: alert, awake and oriented. No focal deficits noted.  Lungs: CTA b/l, no wheezing, rales, crepitus heard.  Heart: regular rate and rhythm. No murmur, additional HS heard.  Extremities: trace  peripheral edema b/l with chronic skin chanes  Abdomen: healed scar in RLQ. soft, non tender, non-distended. BS heard in all quadrants  GU: no flank tenderness noted  MSK: no deformity  Skin: no bruising, rash noted.  Psych: appropriate mood and affect.  Dialysis access: LAVF with palpable bruit and thrill      Labs and Diagnostic Tests:  Urinalysis:  Lab Results   Component Value Date/Time    UCOLOR Yellow 05/31/2019 07:30 AM    TURBID Clear 05/31/2019 07:30 AM    USPGR 1.010 05/31/2019 07:30 AM    UPH 7.0 05/31/2019 07:30 AM    UPROTEIN Negative 05/31/2019 07:30 AM    UAGLU Negative 05/31/2019 07:30 AM    UKET Negative 05/31/2019 07:30 AM    UBILE Negative 05/31/2019 07:30 AM    UBLD Negative 05/31/2019 07:30 AM    UROB 0.2 05/31/2019 07:30 AM       CBC with Diff:  CBC with Diff Latest Ref Rng & Units 05/31/2019 05/01/2019   WBC - 7.3 5.9   RBC 4.20 - 5.40 uL 3.85(L) 4.18   HGB - 12.4 13.5   HCT  37.0 - 47.0 % 39.4 40.4   MCV 80.0 - 99.0 102.0(H) 96.8   MCH 27.0 - 31.0 pG 32.1(H) 32.3   MCHC 30.0 - 34.0 % 31.5 33.4   RDW 11.5 - 14.5 % 13.0 14.8   PLT 130 - 400 119(L) 150   MPV 7 - 11 FL - 7.9   NEUT - 46.0 53   ANC x10 3/uL 3.4 3.15   LYMA 24 - 44 % - 39   ALYM 0.9 - 5.1 x10-3/uL 3.2 2.31   MONA 4 - 12 % - 6   AMONO - 0.6 0.35   EOSA 0 - 5 % - 1   AEOS 0.0 - 0.6 x10-3/uL 0.1 0.03   BASA 0 - 2 % - 1   ABAS - 0.1 0.04       CMP:  CMP Latest Ref Rng & Units 05/31/2019 05/01/2019   NA mmol/L 139 142   K mmol/L 4.9 4.3   CL mmol/L 107 106   CO2 22 - 29 mmol/L 20.0(L) 23   GAP 0 - 14 MEQ/L 17(H) 13(H)   BUN 9.8 - 20.1 mg/dL 36.0(H) 35(H)   CR 0.57 - 1.11 mg/dL 1.61(W) 9.60(A)   GLUX 70 - 100 MG/DL - 540(J)   CA mg/dL 9.5 81.1   TP g/dL 6.9 7.7   ALB g/dL 4.6 5.1(H)   ALKP U/L 97 106   ALT U/L 13 13   TBILI mg/dL 9.14 0.4   GFR >78 26.2(L) 29(L)   GFRAA >60 mL/min - 36(L)       CMV DNA Quant PCR   Date Value   04/04/2019 CMV DNA NOT DETECTED [IU]/mL   02/07/2019 <2.30 Log IU/mL   01/24/2019 3.78 (H)   12/27/2018 PERFORMED AT [facility] [IU]/mL (A)     BK Virus Plasma Quant (no units)   Date Value   05/01/2019 BK Virus Detected (A)   11/15/2018     NOT DETECTED  Reference range: NOT DETECTED  Unit: copies/mL  .  Assay Range: 39 copies/mL to 1.00E+10 copies/mL  .  The limit of quantitation (LOQ) is 39 copies/mL. BK virus DNA  detected below the  LOQ will be reported as Detected:<39 copies/mL.  .  This test was developed and its performance characteristics  determined by  [employer]. It has not been cleared or approved by the U.S.  Food and Drug  Administration. Results should be used in conjunction with clinical  findings,  and should not form the sole basis for a diagnosis or treatment  decision.  ____________________________________________________________  Testing Performed At:  [facility]  [address]  [place], [place] [address]  [phone]  CLIA ID: [id number]     10/18/2018     NOT DETECTED  Reference range: NOT DETECTED  Unit: copies/mL  .  Assay Range: 39 copies/mL to 1.00E+10 copies/mL  .  The limit of quantitation (LOQ) is 39 copies/mL. BK virus DNA  detected below the  LOQ will be reported as Detected:<39 copies/mL.  .  This test was developed and its performance characteristics  determined by  [employer]. It has not been cleared or approved by the U.S.  Food and Drug  Administration. Results should be used in conjunction with clinical  findings,  and should not form the sole basis for a diagnosis or treatment  decision.  ____________________________________________________________  Testing Performed At:  [facility]  [address]  [place], [place] [address]  [phone]  CLIA ID: [id number]     10/04/2018     NOT DETECTED  Reference range: NOT DETECTED  Unit: copies/mL  .  Assay Range: 39 copies/mL to 1.00E+10 copies/mL  .  The limit of quantitation (LOQ) is 39 copies/mL. BK virus DNA  detected below the  LOQ will be reported as Detected:<39 copies/mL.  .  This test was developed and its performance characteristics  determined by  [employer]. It has not been cleared or approved by the U.S.  Food and Drug  Administration. Results should be used in conjunction with clinical  findings,  and should not form the sole basis for a diagnosis or treatment  decision.  ____________________________________________________________  Testing Performed At:  [facility]  [address]  [place], [place] [address]  [phone]  CLIA ID: [id number]       No results found for: [facility]    Other Common Labs:  Other Common Labs Latest Ref Rng & Units 05/31/2019 05/01/2019   IRON 50 - 160 MCG/DL - -   TIBC 962 - 952 MCG/DL - -   PSAT 28 - 42 % - -   FER 10 - 200 NG/ML - -   PO4 - 3.8 3.7   MG - 1.9 1.9   VITD 30 - 80 NG/ML - -   UPRO - Negative NEG   UCRR 47 - 110 41.66(L) 54   UPCRC - - 0.4   URICA 2.6 - 6.0 6.9(H) 7.0   HBA1C 4.0 - 6.0 % - -   Tacrolimus 5.0 - 20.0 mcg/L - -       Imaging:                Assessment and Plan:      Michele Benitez is a 59 y.o. 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  ?  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    ?  #) Kidney function:?slow graft function  Scr today at 1.87 mg/dL within baseline   No DSAs with recent check 10/20; allosure <0.12   UA bland, U Pr/Cr ratio 0.2  ?  #) HTN: controlled   Continue Coreg 25 mg BID , Take Nifedipine 60 mg XL at lunch time to space out medications.   Continue Lisinopril 5 mg at bedtime. .  ?  #) Infection:?  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    ?  #)  Normocytic Anemia:stable    last Hgb 11.9 today    ?  #) Hyperuricemia: watch for now; advised to increase fruit and vegetable intake   ?  #)CAD: mild; continue on asa, Zocor and Coreg  ?  #)Post transplant diabetes mellitus   Has consistent fasting glucose >126,  Hemoglobin A1c today is 6.2 likely falsely low due to recent anemia , epo use.   Increase metformin to 1000 mg XR daily. Lifestyle modification reiterated.   Continue simvastatin 20 mg daily   ?  #) Nephrolithiasis: found in transplanted kidney on imaging from 11/15/18.  At risk of stone formation due to low urine citrate excretion on litholink done on 8/28.  Continue  Potassium Citrate 20 meq BID due to abnormal Litholink   Drink 3L of water and above daily   ?  #) CKD MBD   PTH down to 76.5 04/04/19  from 90.2 on 6/11   Vitamin D insufficiency present, Vit D on 01/02- 24.2   Continue  Vitamin D 5000 iu daily    ?  #) Leg swelling: Improving   Likely due to due to chronic venous stasis vs Nifedipine side effect  Will give compression socks,   Low salt diet encouraged.  Added lisinopril today  No indication for lasix  ?  #) Forgetfulness- unchanged since last visit  metabolic work up (TSH , Vitamin B12, folate levels within normal limits), ? Tac related.   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   General health maint. Managed by PCP   Skin cancer surveillance: Plan to refer to Dermatology for skin eval due to long term immunosuppression   Cancer screening: Mammogram, Pap Smear, colonoscopy per guidelines. Annual flu vaccine    Avoid all live vaccines. We do not recommend Shingrix vaccine due to evidence of rejection shown in renal transplant patients  Encouraged to get the COVID vaccine, FAQs given with after visit summary.      RTC 4 weeks  Labs every 2 weeks.         Renaye Rakers, MD      CC: [name]  CC: Huntington, Raiden Paluch is appropriate for kidney transplant nursing management of immunosuppression medications and laboratory values. The goal is to maintain a therapeutic effect and to decrease the chance of medication nephrotoxicity, rejection and infection while maintaining good allograft function.

## 2019-06-13 NOTE — Progress Notes
C/o morning meds making her tired. BP's WNL now that Nifedipine increased; has some orthostatic hypotension.   Off MMF since BK detected; repeat drawn today  Checks glu before breakfast; has some elevations  Concerned about side effects of  COVID vaccine; handout given   Memory/forgetfulness is "okay"; about the same as her last clinic visit  Increase Metformin to 1000mg  once daily  Called in [facility] yesterday; has enough for the week  Needs to increase fruits/vegetable intake  Advised salad daily with no dressing; apples  When BK is better; start MMF once daily  5000 iu vitamin D once daily  Take Nifedipine 60mg  at lunchtime instead of in the morning  Not wearing compression socks; advised to put the socks on in the mornings  Aim for 4lb weight loss by next clinic visit

## 2019-06-14 ENCOUNTER — Encounter: Admit: 2019-06-14 | Discharge: 2019-06-14 | Payer: MEDICARE

## 2019-06-14 DIAGNOSIS — D849 Immunodeficiency, unspecified: Secondary | ICD-10-CM

## 2019-06-14 DIAGNOSIS — Z940 Kidney transplant status: Secondary | ICD-10-CM

## 2019-06-14 DIAGNOSIS — B348 Other viral infections of unspecified site: Secondary | ICD-10-CM

## 2019-06-14 MED ORDER — TACROLIMUS 1 MG PO TB24
1 mg | ORAL_TABLET | Freq: Every day | ORAL | 11 refills | 30.00000 days | Status: AC
Start: 2019-06-14 — End: ?
  Filled 2019-07-10: qty 30, 30d supply, fill #1

## 2019-06-14 MED ORDER — DIPHENHYDRAMINE HCL 25 MG PO CAP
25 mg | Freq: Once | ORAL | 0 refills | Status: CN
Start: 2019-06-14 — End: ?

## 2019-06-14 MED ORDER — IMMUNE GLOBULIN (HUMAN) (IGG) 10 % IV SOLN (ROUND)
1 g/kg | Freq: Once | INTRAVENOUS | 0 refills | Status: CN
Start: 2019-06-14 — End: ?

## 2019-06-14 MED ORDER — ACETAMINOPHEN 500 MG PO TAB
500 mg | Freq: Once | ORAL | 0 refills | Status: CN
Start: 2019-06-14 — End: ?

## 2019-06-14 NOTE — Telephone Encounter
Reviewed Dr. Orson Eva clinic visit note from  06/14/19.  Labs reviewed  Hgb 11.9  WBC 6.1  Cr 1.87  Mag 1.8  Tacro LC/MS 7.9  BK virus detected, 1800 copies    Orders received from Dr. Verline Lema for  Reduce Envarsus to 1mg  once daily  IVIG once with next clinic visit    TC to pt to advise of medication changes: reduce Envarsus to 1mg  for goal range of 4-5. I let her know that she will go to the infusion center the same day as her clinic visit in a few weeks as well r/t treatment of BK.    Pt repeated dose change back to this RN and v/u of conversation.     Labs q2 weeks  RTC 4-6 weeks per Dr. Orson Eva request.

## 2019-06-19 ENCOUNTER — Encounter: Admit: 2019-06-19 | Discharge: 2019-06-19 | Payer: MEDICARE

## 2019-06-19 MED FILL — LISINOPRIL 5 MG PO TAB: 5 mg | ORAL | 30 days supply | Qty: 30 | Fill #3 | Status: AC

## 2019-07-04 ENCOUNTER — Encounter: Admit: 2019-07-04 | Discharge: 2019-07-04 | Payer: MEDICARE

## 2019-07-10 MED FILL — SULFAMETHOXAZOLE-TRIMETHOPRIM 400-80 MG PO TAB: 80/400 mg | ORAL | 30 days supply | Qty: 30 | Fill #11 | Status: CP

## 2019-07-23 ENCOUNTER — Encounter: Admit: 2019-07-23 | Discharge: 2019-07-23 | Payer: MEDICARE

## 2019-07-24 ENCOUNTER — Ambulatory Visit: Admit: 2019-07-24 | Discharge: 2019-07-24 | Payer: MEDICARE

## 2019-07-24 ENCOUNTER — Encounter: Admit: 2019-07-24 | Discharge: 2019-07-24 | Payer: MEDICARE

## 2019-07-24 DIAGNOSIS — B348 Other viral infections of unspecified site: Secondary | ICD-10-CM

## 2019-07-24 DIAGNOSIS — R9439 Abnormal result of other cardiovascular function study: Secondary | ICD-10-CM

## 2019-07-24 DIAGNOSIS — D849 Immunodeficiency, unspecified: Secondary | ICD-10-CM

## 2019-07-24 DIAGNOSIS — E785 Hyperlipidemia, unspecified: Secondary | ICD-10-CM

## 2019-07-24 DIAGNOSIS — E139 Other specified diabetes mellitus without complications: Secondary | ICD-10-CM

## 2019-07-24 DIAGNOSIS — Z940 Kidney transplant status: Secondary | ICD-10-CM

## 2019-07-24 DIAGNOSIS — Z006 Encounter for examination for normal comparison and control in clinical research program: Secondary | ICD-10-CM

## 2019-07-24 DIAGNOSIS — N186 End stage renal disease: Secondary | ICD-10-CM

## 2019-07-24 DIAGNOSIS — I10 Essential (primary) hypertension: Secondary | ICD-10-CM

## 2019-07-24 LAB — CBC AND DIFF
Lab: 0 10*3/uL (ref 0–0.20)
Lab: 0 10*3/uL (ref 0–0.45)
Lab: 0.4 10*3/uL (ref 0–0.80)
Lab: 1 % (ref 0–5)
Lab: 1 % — ABNORMAL LOW (ref >60)
Lab: 1.5 10*3/uL — ABNORMAL LOW (ref >60)
Lab: 12 g/dL — ABNORMAL HIGH (ref 12.0–15.0)
Lab: 13 % (ref 11–15)
Lab: 147 K/UL — ABNORMAL LOW (ref 150–400)
Lab: 3.1 10*3/uL (ref 1.0–4.8)
Lab: 30 % — ABNORMAL LOW (ref 41–77)
Lab: 33 g/dL (ref 32.0–36.0)
Lab: 33 pg (ref 26–34)
Lab: 36 % — ABNORMAL HIGH (ref 36–45)
Lab: 5.2 10*3/uL (ref 4.5–11.0)
Lab: 59 % — ABNORMAL HIGH (ref 24–44)
Lab: 7.9 FL (ref 7–11)
Lab: 9 % (ref 4–12)
Lab: 99 FL — ABNORMAL HIGH (ref 80–100)

## 2019-07-24 LAB — COMPREHENSIVE METABOLIC PANEL: Lab: 140 MMOL/L (ref 137–147)

## 2019-07-24 LAB — PROTEIN/CR RATIO,UR RAN
Lab: 103 mg/dL (ref 5.0–8.0)
Lab: 12 mg/dL (ref 1.003–1.035)
Lab: 0.1

## 2019-07-24 LAB — URINALYSIS DIPSTICK REFLEX TO CULTURE
Lab: NEGATIVE
Lab: NEGATIVE
Lab: NEGATIVE
Lab: NEGATIVE
Lab: NEGATIVE
Lab: NEGATIVE
Lab: NEGATIVE

## 2019-07-24 LAB — PHOSPHORUS: Lab: 4.5 mg/dL (ref 2.0–4.5)

## 2019-07-24 LAB — BK VIRUS DNA, QUANT PLASMA
Lab: <22
Lab: <70

## 2019-07-24 LAB — URIC ACID: Lab: 7.8 mg/dL — ABNORMAL HIGH (ref 2.0–7.0)

## 2019-07-24 LAB — URINALYSIS MICROSCOPIC REFLEX TO CULTURE

## 2019-07-24 LAB — RESEARCH BLOOD COLLECTION ONLY

## 2019-07-24 LAB — MAGNESIUM: Lab: 2 mg/dL (ref 1.6–2.6)

## 2019-07-26 ENCOUNTER — Encounter: Admit: 2019-07-26 | Discharge: 2019-07-26 | Payer: MEDICARE

## 2019-07-26 MED FILL — POTASSIUM CITRATE 10 MEQ (1,080 MG) PO TBER: 10 mEq (1,080 mg) | ORAL | 45 days supply | Qty: 360 | Fill #3 | Status: AC

## 2019-07-26 MED FILL — LISINOPRIL 5 MG PO TAB: 5 mg | ORAL | 30 days supply | Qty: 30 | Fill #4 | Status: AC

## 2019-07-27 ENCOUNTER — Encounter: Admit: 2019-07-27 | Discharge: 2019-07-27 | Payer: MEDICARE

## 2019-07-27 MED ORDER — PREDNISONE 5 MG PO TAB
ORAL_TABLET | 3 refills | Status: CN
Start: 2019-07-27 — End: ?

## 2019-07-27 MED ORDER — MYCOPHENOLATE SODIUM 180 MG PO TBEC
360 mg | ORAL_TABLET | Freq: Two times a day (BID) | ORAL | 11 refills | Status: DC
Start: 2019-07-27 — End: 2019-07-28

## 2019-07-28 ENCOUNTER — Encounter: Admit: 2019-07-28 | Discharge: 2019-07-28 | Payer: MEDICARE

## 2019-07-28 DIAGNOSIS — Z940 Kidney transplant status: Secondary | ICD-10-CM

## 2019-07-28 DIAGNOSIS — D849 Immunodeficiency, unspecified: Secondary | ICD-10-CM

## 2019-07-28 DIAGNOSIS — B348 Other viral infections of unspecified site: Secondary | ICD-10-CM

## 2019-07-28 DIAGNOSIS — R739 Hyperglycemia, unspecified: Secondary | ICD-10-CM

## 2019-07-28 DIAGNOSIS — E213 Hyperparathyroidism, unspecified: Secondary | ICD-10-CM

## 2019-07-28 DIAGNOSIS — E559 Vitamin D deficiency, unspecified: Secondary | ICD-10-CM

## 2019-07-28 MED ORDER — MYCOPHENOLATE SODIUM 180 MG PO TBEC
180 mg | ORAL_TABLET | Freq: Two times a day (BID) | ORAL | 11 refills | Status: DC
Start: 2019-07-28 — End: 2019-08-18

## 2019-07-31 ENCOUNTER — Encounter: Admit: 2019-07-31 | Discharge: 2019-07-31 | Payer: MEDICARE

## 2019-08-01 ENCOUNTER — Encounter: Admit: 2019-08-01 | Discharge: 2019-08-01 | Payer: MEDICARE

## 2019-08-08 ENCOUNTER — Encounter: Admit: 2019-08-08 | Discharge: 2019-08-08 | Payer: MEDICARE

## 2019-08-10 ENCOUNTER — Encounter: Admit: 2019-08-10 | Discharge: 2019-08-10 | Payer: MEDICARE

## 2019-08-10 MED FILL — TACROLIMUS 1 MG PO TB24: 1 mg | ORAL | 30 days supply | Qty: 30 | Fill #2 | Status: AC

## 2019-08-13 ENCOUNTER — Encounter: Admit: 2019-08-13 | Discharge: 2019-08-13 | Payer: MEDICARE

## 2019-08-13 MED FILL — SULFAMETHOXAZOLE-TRIMETHOPRIM 400-80 MG PO TAB: 80/400 mg | ORAL | 30 days supply | Qty: 30 | Fill #12 | Status: AC

## 2019-08-16 ENCOUNTER — Encounter: Admit: 2019-08-16 | Discharge: 2019-08-16 | Payer: MEDICARE

## 2019-08-17 ENCOUNTER — Encounter: Admit: 2019-08-17 | Discharge: 2019-08-17 | Payer: MEDICARE

## 2019-08-17 DIAGNOSIS — Z940 Kidney transplant status: Secondary | ICD-10-CM

## 2019-08-17 LAB — TACROLIMUS LC-MS/MS: Lab: 6.6

## 2019-08-18 ENCOUNTER — Encounter: Admit: 2019-08-18 | Discharge: 2019-08-18 | Payer: MEDICARE

## 2019-08-18 MED ORDER — MYCOPHENOLATE SODIUM 180 MG PO TBEC
360 mg | ORAL_TABLET | Freq: Two times a day (BID) | ORAL | 11 refills | Status: AC
Start: 2019-08-18 — End: ?
  Filled 2019-08-29: qty 120, 30d supply, fill #1

## 2019-08-22 ENCOUNTER — Encounter: Admit: 2019-08-22 | Discharge: 2019-08-22 | Payer: MEDICARE

## 2019-08-22 DIAGNOSIS — D849 Immunodeficiency, unspecified: Secondary | ICD-10-CM

## 2019-08-22 DIAGNOSIS — Z940 Kidney transplant status: Secondary | ICD-10-CM

## 2019-08-22 LAB — COMPREHENSIVE METABOLIC PANEL
Lab: 1.8 — ABNORMAL HIGH (ref 0.57–1.11)
Lab: 16
Lab: 29
Lab: 0.4 mg/dL (ref 0.2–1.2)
Lab: 10 mg/dL (ref 8.4–10.2)
Lab: 108 mmol/L — ABNORMAL HIGH (ref 98–107)
Lab: 121 mg/dL — ABNORMAL HIGH (ref 70–105)
Lab: 139 mmol/L (ref 136–145)
Lab: 16 meq/L — ABNORMAL HIGH (ref 0–14)
Lab: 19 U/L (ref 5–34)
Lab: 20 mmol/L — ABNORMAL LOW (ref 22–29)
Lab: 23 mg/dL — ABNORMAL HIGH (ref 9.8–20.1)
Lab: 4.2 g/dL (ref 3.5–5.0)
Lab: 4.9 mmol/L (ref 3.5–5.1)
Lab: 6.9 g/dL (ref 6.4–8.3)
Lab: 98 U/L (ref 40–150)

## 2019-08-22 LAB — URINALYSIS DIPSTICK REFLEX TO CULTURE
Lab: 0.2
Lab: NEGATIVE
Lab: NEGATIVE
Lab: NEGATIVE
Lab: NEGATIVE
Lab: NEGATIVE

## 2019-08-22 LAB — CBC AND DIFF
Lab: 12
Lab: 2
Lab: 66 — ABNORMAL HIGH (ref 18–47)
Lab: 0.1 10*3/uL (ref 0.0–0.6)
Lab: 0.2 10*3/uL (ref 0.1–0.9)
Lab: 1.2 10*3/uL — ABNORMAL LOW (ref 1.9–8.1)
Lab: 103 fL — ABNORMAL HIGH (ref 80.0–99.0)
Lab: 12 mg/dL (ref 7–25)
Lab: 142 10*3/uL (ref 130–400)
Lab: 2 % (ref 0–8)
Lab: 25 % — ABNORMAL LOW (ref 40–75)
Lab: 3.1 10*3/uL
Lab: 3.7 10*6/uL — ABNORMAL LOW (ref 4.20–5.40)
Lab: 32 mL/min — ABNORMAL LOW (ref >60)
Lab: 33 mL/min — ABNORMAL HIGH (ref >60)
Lab: 38 % — ABNORMAL HIGH (ref 0.4–1.00)
Lab: 4.7 — ABNORMAL LOW (ref 4.8–10.8)
Lab: 5 % (ref 0–10)

## 2019-08-22 LAB — URIC ACID: Lab: 7.6 mg/dL — ABNORMAL HIGH (ref 2.6–6.0)

## 2019-08-22 LAB — PROTEIN/CR RATIO,UR RAN: Lab: 23 mg/dL — ABNORMAL LOW (ref 47–110)

## 2019-08-22 LAB — URINALYSIS MICROSCOPIC REFLEX TO CULTURE

## 2019-08-22 LAB — PHOSPHORUS: Lab: 4.1 mg/dL (ref 2.3–4.7)

## 2019-08-22 LAB — MAGNESIUM: Lab: 1.7 mg/dL (ref 1.6–2.6)

## 2019-08-29 ENCOUNTER — Encounter: Admit: 2019-08-29 | Discharge: 2019-08-29 | Payer: MEDICARE

## 2019-08-29 DIAGNOSIS — B348 Other viral infections of unspecified site: Secondary | ICD-10-CM

## 2019-08-29 DIAGNOSIS — Z940 Kidney transplant status: Secondary | ICD-10-CM

## 2019-08-29 DIAGNOSIS — R739 Hyperglycemia, unspecified: Secondary | ICD-10-CM

## 2019-08-29 DIAGNOSIS — D849 Immunodeficiency, unspecified: Secondary | ICD-10-CM

## 2019-08-29 LAB — CBC AND DIFF
Lab: 0.1
Lab: 0.1
Lab: 0.5
Lab: 0.5
Lab: 1.3
Lab: 1.3
Lab: 1.5 — ABNORMAL LOW (ref 1.9–8.1)
Lab: 1.8 — ABNORMAL LOW (ref 1.9–8.1)
Lab: 107 — ABNORMAL HIGH (ref 80.0–99.0)
Lab: 12
Lab: 12
Lab: 133
Lab: 2.8
Lab: 3.7 — ABNORMAL LOW (ref 4.20–5.40)
Lab: 31
Lab: 32 — ABNORMAL LOW (ref 40.0–75.0)
Lab: 4.8
Lab: 40
Lab: 55 — ABNORMAL HIGH (ref 18.0–47.0)
Lab: 9.2
Lab: 0.1
Lab: 1.2
Lab: 1.4
Lab: 104 K/UL — ABNORMAL HIGH (ref 80.0–99.0)
Lab: 12 % (ref >60)
Lab: 12 10*3/uL (ref 0–0.20)
Lab: 126 — ABNORMAL LOW (ref 130–400)
Lab: 3.1
Lab: 3.7 % — ABNORMAL LOW (ref >60)
Lab: 30 — ABNORMAL LOW (ref 40.0–75.0)
Lab: 33 K/UL (ref 0–0.45)
Lab: 33 — ABNORMAL HIGH (ref 27.0–31.0)
Lab: 34 10*3/uL — ABNORMAL HIGH (ref 27.0–31.0)
Lab: 38 K/UL (ref 1.8–7.0)
Lab: 5.5 % — ABNORMAL HIGH (ref 4–12)
Lab: 57 — ABNORMAL HIGH (ref 18.0–47.0)
Lab: 9.4

## 2019-08-29 LAB — COMPREHENSIVE METABOLIC PANEL
Lab: 32 — ABNORMAL LOW (ref >59)
Lab: 0.3 mg/dL
Lab: 1.7 mg/dL — ABNORMAL HIGH (ref 0.57–1.11)
Lab: 10 mg/dL
Lab: 10 mg/dL
Lab: 107 mmol/L
Lab: 107 mmol/L
Lab: 109 mg/dL — ABNORMAL HIGH (ref 70–105)
Lab: 139 mmol/L
Lab: 139 mmol/L
Lab: 14 meq/L
Lab: 14 meq/L
Lab: 16 U/L
Lab: 18 U/L
Lab: 23 mmol/L
Lab: 23 mmol/L
Lab: 30 mg/dL — ABNORMAL HIGH (ref 9.8–20.1)
Lab: 30 mg/dL — ABNORMAL HIGH (ref 9.8–20.1)
Lab: 4.2 g/dL
Lab: 4.4 g/dL
Lab: 4.9 mmol/L
Lab: 6.8 g/dL
Lab: 87 U/L

## 2019-08-29 LAB — URINALYSIS DIPSTICK REFLEX TO CULTURE
Lab: 1 g/dL (ref 6.0–8.0)
Lab: NEGATIVE % — ABNORMAL HIGH (ref 24–44)
Lab: NEGATIVE FL (ref 7–11)
Lab: NEGATIVE K/UL (ref 150–400)
Lab: NEGATIVE U/L (ref 7–40)
Lab: NEGATIVE g/dL (ref 3.5–5.0)
Lab: NEGATIVE mg/dL (ref 0.3–1.2)

## 2019-08-29 LAB — URINALYSIS MICROSCOPIC REFLEX TO CULTURE

## 2019-08-29 LAB — URIC ACID
Lab: 7.2 — ABNORMAL HIGH (ref 2.6–6.0)
Lab: 8.1 — ABNORMAL HIGH (ref 2.6–6.0)

## 2019-08-29 LAB — HEMOGLOBIN A1C: Lab: 5.5

## 2019-08-29 LAB — MAGNESIUM
Lab: 1.8
Lab: 2

## 2019-08-29 LAB — TACROLIMUS LC-MS/MS
Lab: 5.5 (ref 5.0–20.0)
Lab: 3.8 mmol/L — ABNORMAL LOW (ref 5.0–20.0)

## 2019-08-29 LAB — PROTEIN/CR RATIO,UR RAN
Lab: 30 pg — ABNORMAL LOW (ref 47–110)
Lab: 37 mg/dL — ABNORMAL LOW (ref 47–110)

## 2019-08-29 LAB — PHOSPHORUS
Lab: 3.9
Lab: 4 mg/dL (ref 1–14)

## 2019-08-29 MED FILL — LISINOPRIL 5 MG PO TAB: 5 mg | ORAL | 30 days supply | Qty: 30 | Fill #5 | Status: AC

## 2019-08-29 NOTE — Telephone Encounter
TC from pt to let us know that she will be having labs drawn tomorrow at [facility]. Order for BK faxed to [facility]; tc to [facility] with confirmation of lab fax rec'd.

## 2019-08-31 ENCOUNTER — Encounter: Admit: 2019-08-31 | Discharge: 2019-08-31 | Payer: MEDICARE

## 2019-08-31 DIAGNOSIS — Z006 Encounter for examination for normal comparison and control in clinical research program: Secondary | ICD-10-CM

## 2019-09-04 ENCOUNTER — Encounter: Admit: 2019-09-04 | Discharge: 2019-09-04 | Payer: MEDICARE

## 2019-09-04 DIAGNOSIS — N186 End stage renal disease: Secondary | ICD-10-CM

## 2019-09-04 DIAGNOSIS — I10 Essential (primary) hypertension: Secondary | ICD-10-CM

## 2019-09-04 DIAGNOSIS — E139 Other specified diabetes mellitus without complications: Secondary | ICD-10-CM

## 2019-09-04 DIAGNOSIS — R9439 Abnormal result of other cardiovascular function study: Secondary | ICD-10-CM

## 2019-09-04 DIAGNOSIS — E785 Hyperlipidemia, unspecified: Secondary | ICD-10-CM

## 2019-09-04 MED ORDER — ACETAMINOPHEN 500 MG PO TAB
500 mg | Freq: Once | ORAL | 0 refills | Status: CN
Start: 2019-09-04 — End: ?

## 2019-09-04 MED ORDER — ACETAMINOPHEN 500 MG PO TAB
500 mg | Freq: Once | ORAL | 0 refills | Status: CP
Start: 2019-09-04 — End: ?
  Administered 2019-09-04: 17:00:00 500 mg via ORAL

## 2019-09-04 MED ORDER — IMMUNE GLOBULIN (HUMAN) (IGG) 10 % IV SOLN (ROUND)
1 g/kg | Freq: Once | INTRAVENOUS | 0 refills | Status: CN
Start: 2019-09-04 — End: ?

## 2019-09-04 MED ORDER — IMMUNE GLOBULIN (HUMAN) (IGG) 10 % IV SOLN (ROUND)
1 g/kg | Freq: Once | INTRAVENOUS | 0 refills | Status: CP
Start: 2019-09-04 — End: ?
  Administered 2019-09-04: 17:00:00 90 g via INTRAVENOUS

## 2019-09-04 MED ORDER — DIPHENHYDRAMINE HCL 25 MG PO CAP
25 mg | Freq: Once | ORAL | 0 refills | Status: CN
Start: 2019-09-04 — End: ?

## 2019-09-04 MED ORDER — DIPHENHYDRAMINE HCL 25 MG PO CAP
25 mg | Freq: Once | ORAL | 0 refills | Status: CP
Start: 2019-09-04 — End: ?
  Administered 2019-09-04: 17:00:00 25 mg via ORAL

## 2019-09-04 NOTE — Progress Notes
Michele Benitez started to throw up after an hour of her infusion, provider notified and an order for sublingual zofran obtained.  Infusion was stopped for 30 minutes and then restarted at the 160ml/hr rate, she is at this time up to the 482ml/hour rate and is tolerating it very well.  She did not want the zofran that was ordered.

## 2019-09-05 ENCOUNTER — Ambulatory Visit: Admit: 2019-09-04 | Discharge: 2019-09-05 | Payer: MEDICARE

## 2019-09-05 ENCOUNTER — Encounter: Admit: 2019-09-05 | Discharge: 2019-09-05 | Payer: MEDICARE

## 2019-09-05 DIAGNOSIS — D849 Immunodeficiency, unspecified: Secondary | ICD-10-CM

## 2019-09-05 DIAGNOSIS — B348 Other viral infections of unspecified site: Secondary | ICD-10-CM

## 2019-09-05 DIAGNOSIS — Z940 Kidney transplant status: Secondary | ICD-10-CM

## 2019-09-05 MED ORDER — ONDANSETRON 4 MG PO TBDI
4 mg | ORAL_TABLET | ORAL | 0 refills | 8.00000 days | Status: AC | PRN
Start: 2019-09-05 — End: ?

## 2019-09-05 NOTE — Telephone Encounter
TC to pt to ask how she is feeling and if she will be attending her appt today. Pt and spouse stated that she is still having n/v this morning. Rec'd Rx for Zofran per Dr. Hollace Hayward. Rx sent to local [place] pharmacy. Clinic apt rescheduled to 6/21. TC back to pt to let her know of Rx at [facility] and new clinic apt. Pt v/u of conversation.

## 2019-09-06 ENCOUNTER — Encounter: Admit: 2019-09-06 | Discharge: 2019-09-06 | Payer: MEDICARE

## 2019-09-06 MED FILL — PANTOPRAZOLE 40 MG PO TBEC: 40 mg | ORAL | 90 days supply | Qty: 180 | Fill #1 | Status: AC

## 2019-09-08 ENCOUNTER — Encounter: Admit: 2019-09-08 | Discharge: 2019-09-08 | Payer: MEDICARE

## 2019-09-08 DIAGNOSIS — Z940 Kidney transplant status: Secondary | ICD-10-CM

## 2019-09-08 DIAGNOSIS — Z79899 Other long term (current) drug therapy: Secondary | ICD-10-CM

## 2019-09-11 ENCOUNTER — Encounter: Admit: 2019-09-11 | Discharge: 2019-09-11 | Payer: MEDICARE

## 2019-09-11 ENCOUNTER — Ambulatory Visit: Admit: 2019-09-11 | Discharge: 2019-09-11 | Payer: MEDICARE

## 2019-09-11 DIAGNOSIS — E139 Other specified diabetes mellitus without complications: Secondary | ICD-10-CM

## 2019-09-11 DIAGNOSIS — I10 Essential (primary) hypertension: Secondary | ICD-10-CM

## 2019-09-11 DIAGNOSIS — N186 End stage renal disease: Secondary | ICD-10-CM

## 2019-09-11 DIAGNOSIS — Z940 Kidney transplant status: Secondary | ICD-10-CM

## 2019-09-11 DIAGNOSIS — R9439 Abnormal result of other cardiovascular function study: Secondary | ICD-10-CM

## 2019-09-11 DIAGNOSIS — Z79899 Other long term (current) drug therapy: Secondary | ICD-10-CM

## 2019-09-11 DIAGNOSIS — E785 Hyperlipidemia, unspecified: Secondary | ICD-10-CM

## 2019-09-11 LAB — CBC AND DIFF
Lab: 0 % (ref 0–2)
Lab: 0 % (ref 0–5)
Lab: 0 10*3/uL (ref 0–0.20)
Lab: 0 10*3/uL (ref 0–0.45)
Lab: 0.3 10*3/uL (ref 0–0.80)
Lab: 12 g/dL (ref 12.0–15.0)
Lab: 13 % (ref 11–15)
Lab: 140 10*3/uL — ABNORMAL LOW (ref 150–400)
Lab: 2.1 10*3/uL (ref 1.0–4.8)
Lab: 2.4 10*3/uL (ref 1.8–7.0)
Lab: 3.6 M/UL — ABNORMAL LOW (ref 4.0–5.0)
Lab: 33 pg (ref 26–34)
Lab: 34 g/dL (ref 32.0–36.0)
Lab: 35 % — ABNORMAL LOW (ref 36–45)
Lab: 4.9 10*3/uL (ref 4.5–11.0)
Lab: 43 % (ref 24–44)
Lab: 50 % (ref 41–77)
Lab: 7 % (ref 4–12)
Lab: 7.8 FL (ref 7–11)
Lab: 95 FL (ref 80–100)

## 2019-09-11 LAB — COMPREHENSIVE METABOLIC PANEL
Lab: 0.4 mg/dL (ref 0.3–1.2)
Lab: 1.8 mg/dL — ABNORMAL HIGH (ref 0.4–1.00)
Lab: 10 mg/dL (ref 8.5–10.6)
Lab: 104 U/L (ref 25–110)
Lab: 107 MMOL/L (ref 98–110)
Lab: 13 U/L (ref 7–56)
Lab: 134 mg/dL — ABNORMAL HIGH (ref 70–100)
Lab: 136 MMOL/L — ABNORMAL LOW (ref 137–147)
Lab: 16 MMOL/L — ABNORMAL LOW (ref 21–30)
Lab: 27 U/L (ref 7–40)
Lab: 27 mg/dL — ABNORMAL HIGH (ref 7–25)
Lab: 28 mL/min — ABNORMAL LOW (ref >60)
Lab: 34 mL/min — ABNORMAL LOW (ref >60)
Lab: 4.3 g/dL (ref 3.5–5.0)
Lab: 4.9 MMOL/L (ref 3.5–5.1)
Lab: 8.1 g/dL — ABNORMAL HIGH (ref 6.0–8.0)
Lab: 13 — ABNORMAL HIGH (ref 3–12)

## 2019-09-11 LAB — LIPID PROFILE
Lab: 131 mg/dL (ref <200)
Lab: 279 mg/dL — ABNORMAL HIGH (ref <150)
Lab: 42 mg/dL (ref >40)
Lab: 46 mg/dL (ref <100)
Lab: 56 mg/dL
Lab: 89 mg/dL

## 2019-09-11 LAB — HEMOGLOBIN A1C: Lab: 5.8 % (ref 4.0–6.0)

## 2019-09-11 MED FILL — TACROLIMUS 1 MG PO TB24: 1 mg | ORAL | 30 days supply | Qty: 30 | Fill #3 | Status: AC

## 2019-09-12 LAB — BK VIRUS DNA, QUANT PLASMA: Lab: <70

## 2019-09-13 ENCOUNTER — Encounter: Admit: 2019-09-13 | Discharge: 2019-09-13 | Payer: MEDICARE

## 2019-09-13 NOTE — Progress Notes
Received fax back from [facility] that patient is not currently seeing their providers. Removed Melissa Huntington from Care Teams.

## 2019-09-14 ENCOUNTER — Encounter: Admit: 2019-09-14 | Discharge: 2019-09-14 | Payer: MEDICARE

## 2019-09-14 DIAGNOSIS — D849 Immunodeficiency, unspecified: Secondary | ICD-10-CM

## 2019-09-14 DIAGNOSIS — Z79899 Other long term (current) drug therapy: Secondary | ICD-10-CM

## 2019-09-14 DIAGNOSIS — Z940 Kidney transplant status: Secondary | ICD-10-CM

## 2019-09-14 MED ORDER — ATORVASTATIN 40 MG PO TAB
40 mg | ORAL_TABLET | Freq: Every evening | ORAL | 3 refills | Status: DC
Start: 2019-09-14 — End: 2019-09-29

## 2019-09-14 NOTE — Telephone Encounter
-----   Message from Renaye Rakers, MD sent at 09/13/2019  7:49 AM CDT -----  Post clinic labs reviewed Please remove hemoglobin A1c from standing order, it was redrawn. Please change zocor to Atorvastatin 40 mg at bedtime. 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 Thank you kindly. Itunu Owoyemi M.B.B.S.

## 2019-09-14 NOTE — Telephone Encounter
Reviewed labs from 09/11/2019 at this time. Hgb 12.2 WBC 4.9 Creat 1.83 Hgb A1C 5.8 BK virus detected <700 copies  Tacrolimus not drawn UA not done UPC not resulted  CMV not drawn. Called [facility] main lab and confirmed the above . Patient will need to be redrawn.     Reviewed final clinic note from 09/11/2019 at this time. No change in POC. Pt was referred to dermatology on 07/28/2019 and Dexa Scan was ordered 07/28/2019    Hemoglobin A1C discontinued from standing lab orders. Faxed new standing orders to [facility] at [phone].     Called Dr. Clementeen Graham office at [phone] and Dr. Manson Passey is now retired. Received the following orders from Dr. Hollace Hayward :     Discontinue Zocor  Start Atorvastatin 40 mg at bedtime    Called patient at this time and reviewed lab results at this time. Notified patient that she will need to pick a new nephrologist because when I attempted to transition her back to Dr.Brown I was informed Dr Manson Passey had retired. Patient did not remember Dr. Manson Passey. Asked if she had been able to schedule Dexa Scan or Dermatology appointment. Patient seemed overwhelmed with this. Patient will get labs re-drawn next week and made plan to call her Monday when husband is present to go over these tasks. She seemed relieved by this. Thanked her for her time.     Routed this message to [facility] for review

## 2019-09-18 ENCOUNTER — Encounter: Admit: 2019-09-18 | Discharge: 2019-09-18 | Payer: MEDICARE

## 2019-09-18 MED ORDER — SULFAMETHOXAZOLE-TRIMETHOPRIM 400-80 MG PO TAB
1 | ORAL_TABLET | Freq: Every day | ORAL | 11 refills | Status: CN
Start: 2019-09-18 — End: ?

## 2019-09-19 ENCOUNTER — Encounter: Admit: 2019-09-19 | Discharge: 2019-09-19 | Payer: MEDICARE

## 2019-09-19 MED ORDER — METFORMIN 500 MG PO TB24
1000 mg | ORAL_TABLET | Freq: Every day | ORAL | 3 refills | Status: AC
Start: 2019-09-19 — End: ?
  Filled 2019-09-19: qty 180, 90d supply, fill #1

## 2019-09-20 ENCOUNTER — Encounter: Admit: 2019-09-20 | Discharge: 2019-09-20 | Payer: MEDICARE

## 2019-09-20 MED FILL — POTASSIUM CITRATE 10 MEQ (1,080 MG) PO TBER: 10 mEq (1,080 mg) | ORAL | 45 days supply | Qty: 180 | Fill #4 | Status: AC

## 2019-09-21 ENCOUNTER — Encounter: Admit: 2019-09-21 | Discharge: 2019-09-21 | Payer: MEDICARE

## 2019-09-21 MED ORDER — SULFAMETHOXAZOLE-TRIMETHOPRIM 400-80 MG PO TAB
1 | ORAL_TABLET | Freq: Every day | ORAL | 11 refills
Start: 2019-09-21 — End: ?

## 2019-09-26 ENCOUNTER — Encounter: Admit: 2019-09-26 | Discharge: 2019-09-26 | Payer: MEDICARE

## 2019-09-26 DIAGNOSIS — Z940 Kidney transplant status: Secondary | ICD-10-CM

## 2019-09-26 NOTE — Telephone Encounter
Called patient at this time per her request: Patient present on speaker phone during this call    Went over orders for Dexa scan:  Tuesday August 10,2021 @ 1400 [facility] location [address] [facility] [place] over Dermatology referral:  Appointment made for Monday November 1st, 2021 @ 1415 at [facility] with Skeet Simmer over need for general nephrologist: patient's husband requested nephrology referral  Made appointment with [facility] Nephrology   Tuesday August 31st at 1030    Fax [phone]    Patient will have labs re-drawn tomorrow 09/27/2019

## 2019-09-28 ENCOUNTER — Encounter: Admit: 2019-09-28 | Discharge: 2019-09-28 | Payer: MEDICARE

## 2019-09-29 MED ORDER — ATORVASTATIN 40 MG PO TAB
20 mg | ORAL_TABLET | Freq: Every evening | ORAL | 3 refills | Status: DC
Start: 2019-09-29 — End: 2019-10-04

## 2019-10-03 ENCOUNTER — Encounter: Admit: 2019-10-03 | Discharge: 2019-10-03 | Payer: MEDICARE

## 2019-10-03 MED FILL — MYCOPHENOLATE SODIUM 180 MG PO TBEC: 180 mg | ORAL | 30 days supply | Qty: 120 | Fill #2 | Status: AC

## 2019-10-03 MED FILL — LISINOPRIL 5 MG PO TAB: 5 mg | ORAL | 30 days supply | Qty: 30 | Fill #6 | Status: AC

## 2019-10-03 NOTE — Telephone Encounter
Rec'd msg from pt re: unable to tolerate Lipitor. TC to pt to ask her symptoms. She stated that she had severe stomach upset, diarrhea, and nausea. I let her know that I would discuss the issues with Dr. Hollace Hayward and call her back with changes. Pt v/u of conversation.

## 2019-10-04 ENCOUNTER — Encounter: Admit: 2019-10-04 | Discharge: 2019-10-04 | Payer: MEDICARE

## 2019-10-06 ENCOUNTER — Encounter: Admit: 2019-10-06 | Discharge: 2019-10-06 | Payer: MEDICARE

## 2019-10-06 MED FILL — TACROLIMUS 1 MG PO TB24: 1 mg | ORAL | 30 days supply | Qty: 60 | Fill #4 | Status: AC

## 2019-10-09 ENCOUNTER — Encounter: Admit: 2019-10-09 | Discharge: 2019-10-09 | Payer: MEDICARE

## 2019-10-09 NOTE — Telephone Encounter
TC from pt asking if Nature's Beauty Hair, Skin, and Nails is okay for her to take. I let her know that the supplement was safe to take.

## 2019-10-26 ENCOUNTER — Encounter: Admit: 2019-10-26 | Discharge: 2019-10-26 | Payer: MEDICARE

## 2019-10-26 MED FILL — MYCOPHENOLATE SODIUM 180 MG PO TBEC: 180 mg | ORAL | 30 days supply | Qty: 120 | Fill #3 | Status: AC

## 2019-10-30 ENCOUNTER — Encounter: Admit: 2019-10-30 | Discharge: 2019-10-30 | Payer: MEDICARE

## 2019-10-30 MED FILL — TACROLIMUS 1 MG PO TB24: 1 mg | ORAL | 30 days supply | Qty: 60 | Fill #5 | Status: AC

## 2019-10-30 MED FILL — LISINOPRIL 5 MG PO TAB: 5 mg | ORAL | 30 days supply | Qty: 30 | Fill #7 | Status: AC

## 2019-10-31 ENCOUNTER — Encounter: Admit: 2019-10-31 | Discharge: 2019-10-31 | Payer: MEDICARE

## 2019-10-31 ENCOUNTER — Ambulatory Visit: Admit: 2019-10-31 | Discharge: 2019-10-31 | Payer: MEDICARE

## 2019-10-31 DIAGNOSIS — E559 Vitamin D deficiency, unspecified: Secondary | ICD-10-CM

## 2019-10-31 DIAGNOSIS — E213 Hyperparathyroidism, unspecified: Secondary | ICD-10-CM

## 2019-10-31 DIAGNOSIS — Z940 Kidney transplant status: Secondary | ICD-10-CM

## 2019-10-31 DIAGNOSIS — D849 Immunodeficiency, unspecified: Secondary | ICD-10-CM

## 2019-11-06 ENCOUNTER — Encounter: Admit: 2019-11-06 | Discharge: 2019-11-06 | Payer: MEDICARE

## 2019-11-06 ENCOUNTER — Ambulatory Visit: Admit: 2019-11-06 | Discharge: 2019-11-06 | Payer: MEDICARE

## 2019-11-06 DIAGNOSIS — Z940 Kidney transplant status: Secondary | ICD-10-CM

## 2019-11-06 DIAGNOSIS — I10 Essential (primary) hypertension: Secondary | ICD-10-CM

## 2019-11-06 DIAGNOSIS — D849 Immunodeficiency, unspecified: Secondary | ICD-10-CM

## 2019-11-06 DIAGNOSIS — N186 End stage renal disease: Secondary | ICD-10-CM

## 2019-11-06 DIAGNOSIS — R9439 Abnormal result of other cardiovascular function study: Secondary | ICD-10-CM

## 2019-11-06 DIAGNOSIS — E139 Other specified diabetes mellitus without complications: Secondary | ICD-10-CM

## 2019-11-06 DIAGNOSIS — E785 Hyperlipidemia, unspecified: Secondary | ICD-10-CM

## 2019-11-06 DIAGNOSIS — Z79899 Other long term (current) drug therapy: Secondary | ICD-10-CM

## 2019-11-06 LAB — PROTEIN/CR RATIO,UR RAN
Lab: 0.2 mg/dL — ABNORMAL LOW (ref 0.3–1.2)
Lab: 33 mg/dL (ref 6.0–8.0)
Lab: 7 mg/dL — ABNORMAL HIGH (ref 8.5–10.6)

## 2019-11-06 LAB — MAGNESIUM: Lab: 1.8 mg/dL (ref 1.6–2.6)

## 2019-11-06 LAB — COMPREHENSIVE METABOLIC PANEL
Lab: 1.4 mg/dL — ABNORMAL HIGH (ref 0.4–1.00)
Lab: 109 mg/dL — ABNORMAL HIGH (ref 70–100)
Lab: 136 MMOL/L — ABNORMAL LOW (ref 137–147)
Lab: 16 U/L (ref 7–40)
Lab: 4.6 MMOL/L (ref 3.5–5.1)
Lab: 5 g/dL — ABNORMAL HIGH (ref 3.5–5.0)

## 2019-11-06 LAB — PHOSPHORUS: Lab: 4.1 mg/dL — ABNORMAL HIGH (ref 2.0–4.5)

## 2019-11-06 LAB — URINALYSIS DIPSTICK REFLEX TO CULTURE: Lab: NEGATIVE

## 2019-11-06 LAB — URINALYSIS MICROSCOPIC REFLEX TO CULTURE

## 2019-11-06 LAB — CBC AND DIFF
Lab: 3.6 K/UL — ABNORMAL LOW (ref 4.5–11.0)
Lab: 3.8 M/UL — ABNORMAL LOW (ref 4.0–5.0)

## 2019-11-06 LAB — URIC ACID: Lab: 7.9 mg/dL — ABNORMAL HIGH (ref 2.0–7.0)

## 2019-11-06 LAB — TACROLIMUS LC-MS/MS: Lab: 6.6 mg/dL (ref >40)

## 2019-11-06 LAB — BK VIRUS DNA, QUANT PLASMA

## 2019-11-06 LAB — LIPID PROFILE
Lab: 123 mg/dL — ABNORMAL HIGH (ref <100)
Lab: 204 mg/dL
Lab: 245 mg/dL — ABNORMAL HIGH (ref <200)
Lab: 403 mg/dL — ABNORMAL HIGH (ref <150)
Lab: 81 mg/dL

## 2019-11-06 MED ORDER — ROSUVASTATIN 10 MG PO TAB
10 mg | ORAL_TABLET | Freq: Every evening | ORAL | 3 refills | 90.00000 days | Status: DC
Start: 2019-11-06 — End: 2019-11-06
  Filled 2019-11-06: qty 90, 90d supply, fill #1

## 2019-11-06 MED ORDER — ROSUVASTATIN 10 MG PO TAB
10 mg | ORAL_TABLET | Freq: Every evening | ORAL | 3 refills | 90.00000 days | Status: AC
Start: 2019-11-06 — End: ?

## 2019-11-06 MED ORDER — LISINOPRIL 40 MG PO TAB
40 mg | ORAL_TABLET | Freq: Every evening | ORAL | 3 refills | Status: DC
Start: 2019-11-06 — End: 2019-11-06
  Filled 2019-11-06: qty 90, 90d supply, fill #1

## 2019-11-06 MED ORDER — LISINOPRIL 40 MG PO TAB
40 mg | ORAL_TABLET | Freq: Every evening | ORAL | 3 refills | Status: AC
Start: 2019-11-06 — End: ?

## 2019-11-06 NOTE — Progress Notes
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     HPI/Interval history:    Michele Benitez is a 59 y.o. 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 She is seen today for a follow up visit in the [place] [facility].     She was last seen in clinic on 09/11/19. She is here today with her husband.     She had a rash she noticed while taking fish oil which she stopped taking. She also stopped taking hair skin and nails.     She plans to  start walking 4 blocks around the parking lot area at her home but recently inactive.     Her blood pressures range from 120s130s/80s . She only takes her Nifedipine in the morning if her blood pressure is high in the 140s - 150s , this happens once or twice daily.    Her fasting blood sugars now range between 105 - 120 mg/dL.     She takes Envarsus at 9 a.m      Allergy:  No Known Allergies    Medications:  ? acetaminophen (TYLENOL) 500 mg tablet Take 500 mg by mouth every 6 hours as needed for Pain. Max of 4,000 mg of acetaminophen in 24 hours.   ? aspirin EC 81 mg tablet Do not take until directed by transplant clinic   ? carvediloL (COREG) 25 mg tablet Take one tablet by mouth twice daily.   ? cholecalciferol (VITAMIN D-3) 5000 unit tablet Take one tablet by mouth daily.   ? lisinopriL (ZESTRIL) 5 mg tablet Take one tablet by mouth at bedtime daily.   ? metFORMIN-XR (GLUCOPHAGE XR) 500 mg extended release tablet Take two tablets by mouth daily with dinner.   ? mycophenolate DR (MYFORTIC) 180 mg TbEC tablet Take two tablets by mouth twice daily.   ? NIFEdipine XL (PROCARDIA-XL) 60 mg tablet Take one tablet by mouth daily.   ? ondansetron (ZOFRAN ODT) 4 mg rapid dissolve tablet Dissolve one tablet by mouth every 8 hours as needed for Nausea or Vomiting. Place on tongue to disolve.   ? pantoprazole DR (PROTONIX) 40 mg tablet Take one tablet by mouth daily.   ? potassium citrate (UROCIT-K) 10 mEq (1,080 mg) tablet Take two tablets by mouth twice daily. Take with food.   ? predniSONE (DELTASONE) 5 mg tablet Please take 5 mg (1 tablet) every other day for two weeks then stop   ? tacrolimus XR (ENVARSUS XR) 1 mg tablet Take one tablet by mouth daily. Indications: prevent kidney transplant rejection   ? trimethoprim/sulfamethoxazole (BACTRIM) 80/400 mg tablet Take one tablet by mouth daily.       Past Medical History:  Medical History:   Diagnosis Date   ? Abnormal stress test 12/03/2015   ? Dyslipidemia 07/18/2014   ?  ESRD (end stage renal disease) (HCC) 12/03/2015   ? Hyperlipemia    ? Hyperphosphatemia 07/18/2014   ? Hypertension 07/18/2014   ? PTDM (post-transplant diabetes mellitus) [facility])        Surgical History:  Surgical History:   Procedure Laterality Date   ? Left Heart Catheterization With Ventriculogram Left 12/03/2015    Performed by Harley Alto, MD at [facility] CATH LAB   ? Coronary Angiography N/A 12/03/2015    Performed by Harley Alto, MD at [facility] CATH LAB   ? Possible Percutaneous Coronary Intervention N/A 12/03/2015    Performed by Harley Alto, MD at [facility] CATH LAB   ? ALLOTRANSPLANTATION KIDNEY FROM NON LIVING DONOR WITHOUT RECIPIENT NEPHRECTOMY N/A 09/01/2018    Performed by York Cerise, MD at [facility] OR   -    Review of Systems: review of systems negative except as outlined in the HPI     Family History:  Family History   Problem Relation Age of Onset   ? Heart Failure Mother    ? Alcohol abuse Mother    ? Cancer Father         colon   -      Social History:  Social History     Socioeconomic History   ? Marital status: Married     Spouse name: Not on file   ? Number of children: 2   ? Years of education: Not on file   ? Highest education level: Not on file   Occupational History   ? Not on file   Tobacco Use   ? Smoking status: Never Smoker   ? Smokeless tobacco: Never Used   Vaping Use   ? Vaping Use: Never used   Substance and Sexual Activity   ? Alcohol use: No   ? Drug use: No   ? Sexual activity: Not on file   Other Topics Concern   ? Not on file   Social History Narrative   ? Not on file    -      Objective:      Vitals:    11/06/19 0805   BP: (!) 153/82   BP Source: Arm, Right Upper   Patient Position: Sitting   Pulse: 79   Temp: 36.4 ?C (97.5 ?F)   SpO2: 95%   Weight: 83.6 kg (184 lb 6.4 oz)   Height: 167.6 cm (66)   PainSc: Zero     Body mass index is 29.76 kg/m?Marland Kitchen     Physical Exam    General:middle aged woman in no obvious distress   EYE: no icterus, no conjunctival injection, redness, discharge  Throat: no pharyngeal erythema  Neuro: alert, awake and oriented. No focal deficits noted.  Lungs: CTA b/l,   Heart: regular rate and rhythm.   Extremities: no pedal edema  Abdomen: healed scar in RLQ. soft, non tender, non-distended, no renal bruit   MSK: no deformity  Skin: no bruising, rash noted.  Psych: appropriate mood and affect.  Dialysis access: LAVF with palpable bruit and thrill      Labs and Diagnostic Tests:  Urinalysis:  Lab Results   Component Value Date/Time    UCOLOR Yellow 08/15/2019 08:42 AM    TURBID Clear 08/15/2019 08:42 AM    USPGR 1.015 08/15/2019 08:42 AM    UPH 7.0 08/15/2019 08:42 AM    UPROTEIN Negative 08/15/2019 08:42 AM    UAGLU Negative 08/15/2019 08:42 AM    UKET Negative 08/15/2019 08:42 AM  UBILE Negative 08/15/2019 08:42 AM    UBLD Trace (A) 08/15/2019 08:42 AM    UROB 0.2 08/15/2019 08:42 AM       CBC with Diff:  CBC with Diff Latest Ref Rng & Units 09/11/2019 08/15/2019   WBC 4.5 - 11.0 K/UL 4.9 4.7(L)   RBC 4.0 - 5.0 M/UL 3.68(L) 3.76(L)   HGB 12.0 - 15.0 GM/DL 16.1 09.6   HCT 36 - 45 % 35.0(L) 38.9   MCV 80 - 100 FL 95.1 103.0(H)   MCH 26 - 34 PG 33.1 33.1(H)   MCHC 32.0 - 36.0 G/DL 04.5 40.9   RDW 11 - 15 % 13.1 12.4   PLT 150 - 400 K/UL 140(L) 142   MPV 7 - 11 FL 7.8 -   NEUT 41 - 77 % 50 25(L)   ANC 1.8 - 7.0 K/UL 2.41 1.2(L)   LYMA 24 - 44 % 43 -   ALYM 1.0 - 4.8 K/UL 2.11 3.1   MONA 4 - 12 % 7 -   AMONO 0 - 0.80 K/UL 0.35 0.2   EOSA 0 - 5 % 0 -   AEOS 0 - 0.45 K/UL 0.01 0.1   BASA 0 - 2 % 0 -   ABAS 0 - 0.20 K/UL 0.01 -       CMP:  CMP Latest Ref Rng & Units 09/11/2019 08/15/2019   NA 137 - 147 MMOL/L 136(L) 139   K 3.5 - 5.1 MMOL/L 4.9 4.9   CL 98 - 110 MMOL/L 107 108(H)   CO2 21 - 30 MMOL/L 16(L) 20.0(L)   GAP 3 - 12 13(H) 16(H)   BUN 7 - 25 MG/DL 81(X) 23.0(H)   CR 0.4 - 1.00 MG/DL 9.14(N) 8.29(F)   GLUX 70 - 100 MG/DL 621(H) -   CA 8.5 - 08.6 MG/DL 57.8 46.9   TP 6.0 - 8.0 G/DL 6.2(X) 6.9   ALB 3.5 - 5.0 G/DL 4.3 4.2   ALKP 25 - 528 U/L 104 98   ALT 7 - 56 U/L 13 16   TBILI 0.3 - 1.2 MG/DL 0.4 4.13   GFR >24 mL/min 28(L) 29.3   GFRAA >60 mL/min 34(L) -       CMV DNA Quant PCR   Date Value   04/04/2019 CMV DNA NOT DETECTED [IU]/mL   02/07/2019 <2.30 Log IU/mL   01/24/2019 3.78 (H)   12/27/2018 PERFORMED AT [facility] [IU]/mL (A)     BK Virus Plasma Quant (no units)   Date Value   09/11/2019 BK Virus Detected (A)   07/24/2019 BK Virus Detected (A)   06/13/2019 BK Virus Detected (A)   05/01/2019 BK Virus Detected (A)   11/15/2018     NOT DETECTED  Reference range: NOT DETECTED  Unit: copies/mL  .  Assay Range: 39 copies/mL to 1.00E+10 copies/mL  .  The limit of quantitation (LOQ) is 39 copies/mL. BK virus DNA  detected below the  LOQ will be reported as Detected:<39 copies/mL.  .  This test was developed and its performance characteristics  determined by  [employer]. It has not been cleared or approved by the U.[place].  Food and Drug  Administration. Results should be used in conjunction with clinical  findings,  and should not form the sole basis for a diagnosis or treatment  decision.  ____________________________________________________________  Testing Performed At:  [facility]  [address]  [place], [place] [address]  [phone]  CLIA ID: [id number]  No results found for: EBVDNAQT    Other Common Labs:  Other Common Labs Latest Ref Rng & Units 09/11/2019 08/15/2019   IRON 50 - 160 MCG/DL - -   TIBC 161 - 096 MCG/DL - -   PSAT 28 - 42 % - -   FER 10 - 200 NG/ML - -   PO4 2.3 - 4.7 mg/dL - 4.1   MG 1.6 - 2.6 mg/dL - 1.7   VITD 30 - 80 NG/ML - -   UPRO Negative - Negative   UCRR 47 - 110 mg/dL - 04.54(U)   UPCRC - - -   URICA 2.6 - 6.0 mg/dL - 7.6(H)   HBA1C 4.0 - 6.0 % 5.8 -   Tacrolimus 5.0 - 20.0 mcg/L - -       Imaging:                Assessment and Plan:      KYNZLI CHOW is a 59 y.o. 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  ?  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      #) Kidney function:? Baseline Scr 1.6 - 1.8 mh/fdL   Scr today below baseline at 1.4 mg/dL   UA bland, no proteinuria   ?  #) HTN: controlled   Continue Coreg 25 mg BID ,   D/c nifedipine since she is only taking as needed   Increase lisinopril from 5 mg to 40 mg at bedtime.    ?  #) ID?  CMV D+/R+ completed 3 months of Valcyte   Completed Bactrim for CMV and PCP/UTI prophylaxis  BK viremia detected but un quantifiable 09/11/19   BK not detected on labs today 11/06/19. No further testing required.     ?  #)  Normocytic Anemia: resolved    #) Mild thrombocytopenia with platelets  Chronic , platelets b/w 120 -140 range   Currently in normal range at 160k    ?  #) Hyperuricemia:  Resolved ?  #)CAD: mild; continue on asa, crestor  and Coreg  Did not tolerate lipitor   Will try crestor 10 mg at bedtime.       #)Post transplant diabetes mellitus   Has consistent fasting glucose >126, Hemoglobin A1C controlled at 5.8 (09/11/19)   Continue metformin to 1000 mg XR daily. Lifestyle modification reiterated.   Start crestor 10 mg at bedtime as above   ?  #) Nephrolithiasis: found in transplanted kidney on imaging from 11/15/18.  At risk of stone formation due to low urine citrate excretion on litholink done on 8/28.  Continue  Potassium Citrate 20 meq BID due to abnormal Litholink   Drink 3L of water and above daily   ?  #) CKD MBD   PTH down to 76.5 04/04/19  from 90.2 on 6/11   Vitamin D insufficiency present, Vit D on 01/02- 24.2   Dexa scan shows osteopenia ( 10/31/19) - Osteopenia, continue Vitamin D supplementation   Continue  Vitamin D 5000 iu daily        #) Forgetfulness-  Improved   Will continue Envarsus 1mg  daily       ?#) Hx of CAD   Cardiac cath in 11/2015 -  mild plaquing in RCA and LAD   Aspirin, statin therapy as above     #) Hair loss   Ok to use Nioxin Shampoo.     ?   General health maint. Managed by PCP   Skin cancer surveillance: Plan to refer to Dermatology for skin eval due to long term immunosuppression, planned Nov 1st    Cancer screening: Mammogram, Pap Smear, colonoscopy per guidelines. Annual flu vaccine    Avoid all live vaccines. We do not recommend Shingrix vaccine due to evidence of rejection shown in renal transplant patients  Received  COVID vaccine      RTC 1 yr   Labs every month        Alphonsus Doyel Anselm Lis, MD      CC: [name]  CC: [place], Everli Rother is appropriate for kidney transplant nursing management of immunosuppression medications and laboratory values. The goal is to maintain a therapeutic effect and to decrease the chance of medication nephrotoxicity, rejection and infection while maintaining good allograft function.

## 2019-11-08 ENCOUNTER — Encounter: Admit: 2019-11-08 | Discharge: 2019-11-08 | Payer: MEDICARE

## 2019-11-13 ENCOUNTER — Encounter: Admit: 2019-11-13 | Discharge: 2019-11-13 | Payer: MEDICARE

## 2019-11-13 NOTE — Telephone Encounter
Reviewed Dr. Edwin Dada clinic note from 11/06/19.  Labs reviewed during visit from 11/06/19  Hgb 12.3  WBC 3.6  K 4.6  Cr 1.40  Mg 1.8  Phos 4.1  Tacro 6.6    Orders placed during visit: Increase Lisinopril from 5mg  to 40mg  qHS, d/c nifedipine, start Crestor 10mg  qHS.       Orders placed after completion of provider's note: none    Labs monthly  RTC 1 year

## 2019-11-14 ENCOUNTER — Encounter: Admit: 2019-11-14 | Discharge: 2019-11-14 | Payer: MEDICARE

## 2019-11-14 MED ORDER — NIFEDIPINE 30 MG PO TR24
30 mg | ORAL_TABLET | Freq: Every day | ORAL | 11 refills | 30.00000 days | Status: AC
Start: 2019-11-14 — End: ?

## 2019-11-14 MED FILL — POTASSIUM CITRATE 10 MEQ (1,080 MG) PO TBER: 10 mEq (1,080 mg) | ORAL | 45 days supply | Qty: 360 | Fill #5 | Status: AC

## 2019-11-14 NOTE — Telephone Encounter
140-160s systolic in the morning and during the day after stopping nifedipine and increasing Lisinopril to 40mg  qhs.   She took 60 mg nifedipine and went down to 126/82. She's taken nifedipine 60mg  twice since her last visit.     She states that diarrhea increases with the hot weather. I advised pt to increase her water intake.     Taking Crestor with no issues.     Msg sent to covering provider since Dr. Hollace Hayward is out of office.   Order rec'd from Dr. Wayne Sever for nifedipine XL 30mg  once daily.     TC back to pt to let her know of Rx. Pt repeated instructions and v/u of conversation.

## 2019-11-21 ENCOUNTER — Encounter: Admit: 2019-11-21 | Discharge: 2019-11-21 | Payer: MEDICARE

## 2019-11-21 DIAGNOSIS — Z23 Encounter for immunization: Secondary | ICD-10-CM

## 2019-11-22 ENCOUNTER — Encounter: Admit: 2019-11-22 | Discharge: 2019-11-22 | Payer: MEDICARE

## 2019-11-22 MED FILL — MYCOPHENOLATE SODIUM 180 MG PO TBEC: 180 mg | ORAL | 30 days supply | Qty: 120 | Fill #4 | Status: AC

## 2019-11-22 MED FILL — TACROLIMUS 1 MG PO TB24: 1 mg | ORAL | 30 days supply | Qty: 60 | Fill #6 | Status: AC

## 2019-11-24 ENCOUNTER — Encounter: Admit: 2019-11-24 | Discharge: 2019-11-24 | Payer: MEDICARE

## 2019-12-01 ENCOUNTER — Encounter: Admit: 2019-12-01 | Discharge: 2019-12-01 | Payer: MEDICARE

## 2019-12-01 MED ORDER — NIFEDIPINE 30 MG PO TR24
30 mg | ORAL_TABLET | Freq: Every day | ORAL | 11 refills | 30.00000 days | Status: AC
Start: 2019-12-01 — End: ?

## 2019-12-01 NOTE — Telephone Encounter
TC to patient per her request. Patient was frantic and reporting multiple blood pressures and was talking about multiple medications. I had to ask patient to take a deep breath and start the entire conversation over. Patient was incorrectly calling [phone] to attempt to get ahold of me x 2 days. I told her to please call [phone] from now on to get ahold of me at transplant center she acknowedged. Patient is almost out of nifedipine. Sent refill to local pharmacy. Patient is concerned that her BP goes up in the evening, though she is checking her BP every 2 hours throughout the day. Asked to the patient to please check her BP morning and evening only until Wednesday and to please reach out with BP log for evaluation. I asked her to please call after hours line if SBP greater than 160 or DBP greater than 100

## 2019-12-07 ENCOUNTER — Encounter: Admit: 2019-12-07 | Discharge: 2019-12-07 | Payer: MEDICARE

## 2019-12-07 MED ORDER — ASPIRIN 81 MG PO TBEC
81 mg | ORAL_TABLET | Freq: Every day | ORAL | 3 refills | Status: AC
Start: 2019-12-07 — End: ?

## 2019-12-07 NOTE — Telephone Encounter
Calling to update NC w her BP readings. Call back number is correct in chart

## 2019-12-07 NOTE — Telephone Encounter
TC to patient for BP log check:    Saturday 12/02/2019:  AM: 128/78 did not take nifedipine  PM: 120/78    Sunday 12/03/2019:   AM: 142/86 took 1 pill nifedipine XL 30 mg   PM: 138/84    Monday 9/13//2021:  AM: 111/72 did not take nifedipine   PM: 142/87    Tuesday 12/05/2019:  AM: 144/87 took 1 pill nifedipine XL 30 mg   PM: 160/97 took 2 pills nifedipine XL 30 mg     Wednesday 12/06/2019:   Did not write down BP took 1 pill nifedipine XL 30 mg     Thursday 12/07/2019:  AM 166/92 took 2 pills nifedipine XL 30 mg     Reviewed clinic notes from 11/06/2019 at this time and nifedipine was discontinued during clinic visit. PATIENT HAS BEEN INCORRECTLY TAKING HER MEDICATIONS AS SHE HAS NOT STARTED LISINOPRIL 40 MG AT BEDTIME and she is SELF-DOSING nifedipine XL 30 mg tablets.     Education to patient at this time. Asked her to please throw away all bottles of nifedipine while on the phone with me. Patient verbalized understanding and stated she threw them away. Advised her to please start lisinopril 40 mg at bedtime tonight and monitor Bps once daily in am. Made plan to check with patient next Thursday for BP log. Asked Nonnie Done PharmD to make new MAP for patient. Patient does not use mychart or email, so MAP mailed to patient's home. Spent prolonged amount of time with patient on phone explaining importance of medication compliance.     Routed this message to Dr. Hollace Hayward for review

## 2019-12-19 ENCOUNTER — Encounter: Admit: 2019-12-19 | Discharge: 2019-12-19 | Payer: MEDICARE

## 2019-12-20 MED FILL — TACROLIMUS 1 MG PO TB24: 1 mg | ORAL | 30 days supply | Qty: 60 | Fill #7 | Status: AC

## 2020-01-02 ENCOUNTER — Encounter: Admit: 2020-01-02 | Discharge: 2020-01-02 | Payer: MEDICARE

## 2020-01-03 MED FILL — MYCOPHENOLATE SODIUM 180 MG PO TBEC: 180 mg | ORAL | 30 days supply | Qty: 120 | Fill #5 | Status: AC

## 2020-01-08 ENCOUNTER — Encounter: Admit: 2020-01-08 | Discharge: 2020-01-08 | Payer: MEDICARE

## 2020-01-08 MED FILL — POTASSIUM CITRATE 10 MEQ (1,080 MG) PO TBER: 10 mEq (1,080 mg) | ORAL | 30 days supply | Qty: 120 | Fill #6 | Status: CP

## 2020-01-16 ENCOUNTER — Encounter: Admit: 2020-01-16 | Discharge: 2020-01-16 | Payer: MEDICARE

## 2020-01-16 MED FILL — TACROLIMUS 1 MG PO TB24: 1 mg | ORAL | 30 days supply | Qty: 60 | Fill #8 | Status: AC

## 2020-01-22 ENCOUNTER — Encounter: Admit: 2020-01-22 | Discharge: 2020-01-22 | Payer: MEDICARE

## 2020-01-22 DIAGNOSIS — B353 Tinea pedis: Secondary | ICD-10-CM

## 2020-01-22 DIAGNOSIS — R9439 Abnormal result of other cardiovascular function study: Secondary | ICD-10-CM

## 2020-01-22 DIAGNOSIS — Z940 Kidney transplant status: Secondary | ICD-10-CM

## 2020-01-22 DIAGNOSIS — E139 Other specified diabetes mellitus without complications: Secondary | ICD-10-CM

## 2020-01-22 DIAGNOSIS — D229 Melanocytic nevi, unspecified: Secondary | ICD-10-CM

## 2020-01-22 DIAGNOSIS — I10 Essential (primary) hypertension: Secondary | ICD-10-CM

## 2020-01-22 DIAGNOSIS — E785 Hyperlipidemia, unspecified: Secondary | ICD-10-CM

## 2020-01-22 DIAGNOSIS — D489 Neoplasm of uncertain behavior, unspecified: Principal | ICD-10-CM

## 2020-01-22 DIAGNOSIS — N186 End stage renal disease: Secondary | ICD-10-CM

## 2020-01-22 NOTE — Progress Notes
Date of Service: 01/22/2020    Subjective:             Michele Benitez is a 59 y.o. female.    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    - These have been present for many years.   - There is no history of blistering sunburns.  - None are itching, bleeding, or painful    Personal Hx: No history of skin cancer.   Family History: No family history of skin cancer.   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        Review of Systems   Constitutional: Negative for appetite change, diaphoresis, fatigue, fever and unexpected weight change.   HENT: Negative for congestion, mouth sores and sore throat.    Eyes: Negative for pain, redness, itching and visual disturbance.   Respiratory: Negative for cough and shortness of breath.    Cardiovascular: Negative for palpitations and leg swelling.   Gastrointestinal: Negative for abdominal pain, blood in stool, diarrhea, nausea and vomiting.   Genitourinary: Negative for difficulty urinating and hematuria.   Musculoskeletal: Positive for arthralgias and myalgias.   Neurological: Negative for dizziness, seizures and facial asymmetry.   Hematological: Does not bruise/bleed easily.   Psychiatric/Behavioral: Negative for confusion and dysphoric mood. The patient is not nervous/anxious.          Objective:         ? acetaminophen (TYLENOL) 500 mg tablet Take 500 mg by mouth every 6 hours as needed for Pain. Max of 4,000 mg of acetaminophen in 24 hours.   ? aspirin EC 81 mg tablet Take one tablet by mouth daily.   ? carvediloL (COREG) 25 mg tablet Take one tablet by mouth twice daily.   ? cholecalciferol (VITAMIN D-3) 5000 unit tablet Take one tablet by mouth daily.   ? lisinopriL (ZESTRIL) 40 mg tablet Take one tablet by mouth at bedtime daily.   ? metFORMIN-XR (GLUCOPHAGE XR) 500 mg extended release tablet Take two tablets by mouth daily with dinner.   ? mycophenolate DR (MYFORTIC) 180 mg TbEC tablet Take two tablets by mouth twice daily.   ? ondansetron (ZOFRAN ODT) 4 mg rapid dissolve tablet Dissolve one tablet by mouth every 8 hours as needed for Nausea or Vomiting. Place on tongue to disolve.   ? pantoprazole DR (PROTONIX) 40 mg tablet Take one tablet by mouth daily.   ? potassium citrate (UROCIT-K) 10 mEq (1,080 mg) tablet Take two tablets by mouth twice daily. Take with food.   ? rosuvastatin (CRESTOR) 10 mg tablet Take one tablet by mouth at bedtime daily for 360 days.   ? tacrolimus XR (ENVARSUS XR) 1 mg tablet Take one tablet by mouth daily. Indications: prevent kidney transplant rejection     Vitals:    01/22/20 1426   Weight: 83.9 kg (185 lb)   Height: 167.6 cm (66)   PainSc: Zero     Body mass index is 29.86 kg/m?Marland Kitchen     Physical Exam  Areas Examined (all normal unless noted below):  Head/Face  Neck  Chest  Back  Abdomen  R upper ext  L upper ext  R lower ext  L lower ext    Pertinent findings include:    Extensive scaling on the  plantar surfaces of the feet and maceration in the toe web spaces.     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     RTC  In 1 year     Shave biopsy procedure note    Risk and benefits of the above procedure including bleeding, pain, dyspigmentation, scar, infection, recurrence or nerve damage with loss of muscle function and/or skin sensation were discussed with the patient (or legal guardian) in detail, who afterwards decided to proceed with the procedure.    Diagnosis: see progress note  Body Site: see progress note  Preparation:  Alcohol   Anesthesia:  1% lidocaine with epinephrine  Instrument:  Dermablade  Hemostasis:  AlCl3  Closure:  None  Wound dressing:  Vaseline  Wound care instructions given:  Verbal  Complications:  None  Tolerated well: Yes  Ambulated from room:  Yes  Pathology sent to:  [facility]  Duration of proedure:  >5 minutes    Procedure Time Out Check List:  Prior to the start of the procedure, I personally confirmed the following:    Site Marking Verified: Yes, as appropriate  Patient Identity (name & date of birth): Yes  Procedure: Yes  Site: Yes  Body Part: see above    The risks of the procedure, including infection, bleeding, pain and skin changes, were discussed with the patient.

## 2020-01-22 NOTE — Patient Instructions
Vitamin D  - We recommend Vitamin D supplementation after a fatty meal, especially if there is no contraindications such as kidney stones    Moles  --Common melanocytic nevi (moles) tend to be =6 mm in diameter and symmetric with even pigmentation, round or oval shape, regular outline, and sharp, non-fuzzy border.  --Dermal nevi stick out from the skin, but if they are soft they are usually not worrisome.  --Flaky seemingly stuck-on brown bumps are usually benign keratoses, not moles.  --Bright red smooth bumps that do not bleed are usually benign blood vessel lesions (cherry angiomas), not moles.  --Atypical nevi/clinical features of possible melanoma include asymmetry, border irregularities, color variability, and diameter >6 mm.  The earliest sign of melanoma is usually a rapidly growing mole.  --About half of melanoma arises in an existing mole and up to half on normal skin.  --It is normal to get new moles until the age of 7-45 years old.  --Multiple atypical nevi are a marker of increased risk of melanoma. The risk of melanoma depends also upon the total number of nevi, family and/or personal history of melanoma, and sun exposure history.   --It is important to look at your moles once a month.  It may help to follow them with photos such as on your smart phone.  Looking once a month you can notice rapid changes and call if these occur.  --Using sunscreens and sun avoidance will decrease your risk of developing melanoma.  The best sun protection is sun avoidance, including with clothing such as long sleeves and a broad brimmed hat.  The best sunscreens are SPF 30 or above cream based with zinc oxide.  One ounce (shot glass sized) amount is needed for an adult.  It should be reapplied every 2 hours if possible.  --Any tanning bed use will increase your risk of melanoma significantly.    Sun Protection   UPF/SPF rated clothing (gloves, long sleeves, scarves); broad-brimmed hats (NOT ball caps!)   [employer] additive can increase the SPF value of your everyday clothing   Cowboy hats protect from sun; baseball hats don't   Here are several recommended zinc-based sunscreen brands in aplphabetical order (always read the ingredient list, as many brands have multiple varieties of sunscreens and not all are zinc-based)   Cyndia Bent, Coca [employer]a, [employer], Culp, [place]side, Goddess Garden, Green Screen,  [employer], Forensic scientist, SkinCeuticals, Vanicream   2 shot-glases = whole body    Melanoma Patient Information    Also called malignant melanoma     Skin cancer screening: If you notice a mole that differs from others or one that changes, bleeds, or itches, see a dermatologist.   Melanoma is a type of skin cancer. Anyone can get melanoma. When found early and treated, the cure rate is nearly 100%. Allowed to grow, melanoma can spread to other parts of the body. Melanoma can spread quickly. When melanoma spreads, it can be deadly.Dermatologists believe that the number of deaths from melanoma would be much lower if people:  Knew the warning signs of melanoma.   Learned how to examine their skin for signs of skin cancer.   Took the time to examine their skin.   It's important to take time to look at the moles on your skin because this is a good way to find melanoma early. When checking your skin, you should look for the ABCDEs of melanoma.     ABCDE's of melanoma:  When performing monthly  skin exams for your moles or new moles, remember the ABCDE's of melanoma:    A - Asymmetry. (Concerning if spot is not symmetric)  B - Border. (Irregular border or notched border are concerning)  C - Color. (Multiple colors or changes in color are concerning.)  D - Diameter. (Larger than 6mm, ie, a pencil eraser, is concerning.)  E - Evolution. (An evolving or changing spot is concerning. If new itch, tenderness, or bleeding develop, these are concerning changes too.  See further explanation below:    Melanoma: Signs and symptoms Anyone can get melanoma. It's important to take time to look at the moles on your skin because this is a good way to find melanoma early. When checking your skin, you should look for the ABCDEs of melanoma.     ABCDEs of melanoma     A = Asymmetry  One half is unlike the other half.       B = Border  An irregular, scalloped, or poorly defined border.       C = Color  Is varied from one area to another; has shades of tan, brown or black, or is sometimes white, red, or blue.       D = Diameter  Melanomas usually greater than 6mm (the size of a pencil eraser) when diagnosed, but they can be smaller.       E = Evolving  A mole or skin lesion that looks different from the rest or is changing in size, shape, or color.    !! If you see a mole or new spot on your skin that has any of the ABCDEs, immediately make an appointment to see a dermatologist.    Signs of melanoma  The most common early signs (what you see) of melanoma are:     Growing mole on your skin.   Unusual looking mole on your skin or a mole that does not look like any other mole on your skin (the ugly duckling).   Non-uniform mole (has an odd shape, uneven or uncertain border, different colors).     Symptoms of melanoma  In the early stages, melanoma may not cause any symptoms (what you feel). But sometimes melanoma will:    Itch.    Bleed.    Feel painful.   Many melanomas have these signs and symptoms, but not all. There are different types of melanoma. One type can first appear as a brown or black streak underneath a fingernail or toenail. Melanoma also can look like a bruise that just won't heal.     Who gets melanoma?  Anyone can get melanoma. Most people who get it have light skin, but people who have brown and black skin also get melanoma.   Some people have a higher risk of getting melanoma. These people have the following traits:   Skin    Fair skin (The risk is higher if the person also has red or blond hair and blue or green eyes). Sun-sensitive skin (rarely tans or burns easily).    50-plus moles, large moles, or unusual-looking moles.    If you have had bad sunburns or spent time tanning (sun, tanning beds, or sun lamps), you also have a higher risk of getting melanoma.   Men older than 50 are at a higher risk for developing skin cancers, including melanoma. Learning how to check your skin and getting skin exams can help detect skin cancer.    Family/medical history   Melanoma runs  in the family (parent, child, sibling, cousin, aunt, uncle had melanoma).   You had another skin cancer, but most especially another melanoma.   A weakened immune system.      Research shows that indoor tanning increases a person's melanoma risk by 75%. The risk also may increase if you had breast or thyroid cancer.    More people getting melanoma  Fewer people are getting most types of cancer. Melanoma is different. More people are getting melanoma. Many are white men who are 50 years or older. More young people also are getting melanoma. Melanoma is now the most common cancer among people 19-66 years old. Even teenagers are getting melanoma.    What causes melanoma?  Ultraviolet (UV) radiation is a major contributor in most cases. We get UV radiation from the sun, tanning beds, and sun lamps. Heredity also plays a role. Research shows that if a close blood relative (parent, child, sibling, aunt, uncle) had melanoma, a person has a much greater risk of getting melanoma.     How do dermatologists diagnose melanoma?  To diagnose melanoma, a dermatologist begins by looking at the patient's skin. A dermatologist will carefully examine moles and other suspicious spots. To get a better look, a dermatologist may use a device called a dermoscope.      Vitamin D    Our bodies need vitamin D to build strong and healthy bones. Vitamin D helps the body absorb the calcium that our bones require.     For a healthy person, the recommended daily dietary allowance is 600 international units for people of 56-6 years old, and 800 international units for people older than 71 years. More vitamin D is not better. Higher amounts of vitamin D could be harmful, leading to many health problems such as high blood pressure and kidney damage.     American academy of Dermatology recommending everyone get vitamin D from foods naturally rich in vitamin D, foods and beverages fortified with vitamin D or vitamin D supplements. The foods that contain the greatest amount are fatty fishes such as salmon, tuna and mackerel. Fish liver oil is another good source.     One of the sources to look up vitamin amount is through [employer]. [employer]ank[employer]. This can help you find out whether you get enough vitamin D from your diet. If you are like many people, you may not be getting your recommended dietary allowance of vitamin D. You may want to change the foods that you eat or take a vitamin D supplements. Before you start taking a vitamin D supplement, talk with your doctor.     Vitamin D is produced in the skin by UV light, but the amount is highly variable and depends on many factors. However, getting vitamin D from the sun or tanning beds can 1) increase your risk of developing skin cancer including melanoma which can be deadly, 2) resulting premature skin aging (wrinkles, age spots, blotchy complexion) and 3) leading to a weakened immune system. Therefore, Teacher, music of Dermatology recommend getting vitamin D safely from foods, beverages and supplements.

## 2020-01-23 ENCOUNTER — Encounter: Admit: 2020-01-23 | Discharge: 2020-01-23 | Payer: MEDICARE

## 2020-01-23 ENCOUNTER — Ambulatory Visit: Admit: 2020-01-22 | Discharge: 2020-01-23 | Payer: MEDICARE

## 2020-01-23 DIAGNOSIS — E785 Hyperlipidemia, unspecified: Secondary | ICD-10-CM

## 2020-01-23 DIAGNOSIS — N186 End stage renal disease: Secondary | ICD-10-CM

## 2020-01-23 DIAGNOSIS — I10 Essential (primary) hypertension: Secondary | ICD-10-CM

## 2020-01-23 DIAGNOSIS — Z940 Kidney transplant status: Secondary | ICD-10-CM

## 2020-01-23 DIAGNOSIS — R9439 Abnormal result of other cardiovascular function study: Secondary | ICD-10-CM

## 2020-01-23 DIAGNOSIS — E139 Other specified diabetes mellitus without complications: Secondary | ICD-10-CM

## 2020-01-29 ENCOUNTER — Encounter: Admit: 2020-01-29 | Discharge: 2020-01-29 | Payer: MEDICARE

## 2020-01-29 MED FILL — ROSUVASTATIN 10 MG PO TAB: 10 mg | ORAL | 90 days supply | Qty: 90 | Fill #2 | Status: AC

## 2020-01-31 ENCOUNTER — Encounter: Admit: 2020-01-31 | Discharge: 2020-01-31 | Payer: MEDICARE

## 2020-02-01 ENCOUNTER — Encounter: Admit: 2020-02-01 | Discharge: 2020-02-01 | Payer: MEDICARE

## 2020-02-01 MED FILL — MYCOPHENOLATE SODIUM 180 MG PO TBEC: 180 mg | ORAL | 30 days supply | Qty: 120 | Fill #6 | Status: AC

## 2020-02-01 MED FILL — POTASSIUM CITRATE 10 MEQ (1,080 MG) PO TBER: 10 mEq (1,080 mg) | ORAL | 90 days supply | Qty: 360 | Fill #7 | Status: AC

## 2020-02-09 ENCOUNTER — Encounter: Admit: 2020-02-09 | Discharge: 2020-02-09 | Payer: MEDICARE

## 2020-02-12 ENCOUNTER — Encounter: Admit: 2020-02-12 | Discharge: 2020-02-12 | Payer: MEDICARE

## 2020-02-12 MED FILL — TACROLIMUS 1 MG PO TB24: 1 mg | ORAL | 30 days supply | Qty: 60 | Fill #9 | Status: AC

## 2020-02-12 NOTE — Telephone Encounter
Patient called and would like a call back about her BP's the lower number is like 90 to 100

## 2020-02-13 ENCOUNTER — Encounter: Admit: 2020-02-13 | Discharge: 2020-02-13 | Payer: MEDICARE

## 2020-02-13 NOTE — Telephone Encounter
TC to patient and patient spouse with no answer. Unable to leave vm as vm not set up for either patient or husband. Will try back.

## 2020-02-21 ENCOUNTER — Encounter: Admit: 2020-02-21 | Discharge: 2020-02-21 | Payer: MEDICARE

## 2020-02-21 DIAGNOSIS — Z940 Kidney transplant status: Secondary | ICD-10-CM

## 2020-02-21 DIAGNOSIS — D849 Immunodeficiency, unspecified: Secondary | ICD-10-CM

## 2020-02-21 DIAGNOSIS — Z79899 Other long term (current) drug therapy: Secondary | ICD-10-CM

## 2020-02-21 LAB — TACROLIMUS LC-MS/MS: Lab: 4.4 ug/L — ABNORMAL LOW (ref 5.0–20.0)

## 2020-02-26 ENCOUNTER — Encounter: Admit: 2020-02-26 | Discharge: 2020-02-26 | Payer: MEDICARE

## 2020-02-26 DIAGNOSIS — Z940 Kidney transplant status: Secondary | ICD-10-CM

## 2020-02-26 DIAGNOSIS — Z79899 Other long term (current) drug therapy: Secondary | ICD-10-CM

## 2020-02-26 DIAGNOSIS — D849 Immunodeficiency, unspecified: Secondary | ICD-10-CM

## 2020-02-26 LAB — COMPREHENSIVE METABOLIC PANEL
Lab: 0.4 mg/dL (ref 0.2–1.2)
Lab: 10 mg/dL — ABNORMAL HIGH (ref 8.4–10.2)
Lab: 102 mg/dL (ref 70–105)
Lab: 106 mmol/L (ref 98–107)
Lab: 115 U/L (ref 40–150)
Lab: 138 mmol/L (ref 136–145)
Lab: 15 U/L (ref 0–55)
Lab: 17 U/L (ref 5–34)
Lab: 17 meq/L — ABNORMAL HIGH (ref 0–14)
Lab: 20 mmol/L — ABNORMAL LOW (ref 22–29)
Lab: 37 mL/Min/1.73 — ABNORMAL LOW (ref >59)
Lab: 4.6 mmol/L (ref 3.5–5.1)

## 2020-02-26 LAB — CBC AND DIFF
Lab: 0.1 x10-3/uL (ref 0.0–0.2)
Lab: 0.1 x10-3/uL — ABNORMAL LOW (ref 0.0–0.6)
Lab: 0.5 x10-3/uL (ref 0.1–0.9)
Lab: 1.3 % (ref 0.0–2.0)
Lab: 1.4 % (ref 0.0–6.0)
Lab: 1.7 x10-3/uL (ref 0.9–5.1)
Lab: 10 % — ABNORMAL HIGH (ref 0.0–10.0)
Lab: 11 g/dL — ABNORMAL LOW (ref 12.0–16.0)
Lab: 13 % (ref 11.5–14.5)
Lab: 132 x10-3/uL (ref 130–400)
Lab: 2 x10-3/uL (ref 1.9–8.1)
Lab: 3.7 x10-6/uL — ABNORMAL LOW (ref 4.20–5.40)
Lab: 30 g/dL (ref 30.0–34.0)
Lab: 30 pg (ref 27.0–31.0)
Lab: 37 % (ref 37.0–47.0)
Lab: 39 % (ref 18.0–47.0)
Lab: 4.2 x10-3/uL — ABNORMAL LOW (ref 4.8–10.8)
Lab: 47 % (ref 40.0–75.0)
Lab: 98 fL (ref 80.0–99.0)

## 2020-02-26 LAB — MAGNESIUM: Lab: 1.9 mg/dL — ABNORMAL LOW (ref 1.6–2.6)

## 2020-02-26 LAB — PHOSPHORUS: Lab: 4.5 mg/dL — ABNORMAL LOW (ref 2.3–4.7)

## 2020-02-26 MED ORDER — MULTIVITAMIN WITH MINERALS PO TAB
1 | ORAL_TABLET | Freq: Every day | ORAL | 3 refills | 90.00000 days | Status: AC
Start: 2020-02-26 — End: ?

## 2020-02-26 MED ORDER — CARVEDILOL 25 MG PO TAB
25 mg | ORAL_TABLET | Freq: Two times a day (BID) | ORAL | 6 refills | 90.00000 days | Status: AC
Start: 2020-02-26 — End: ?

## 2020-02-26 MED FILL — LISINOPRIL 40 MG PO TAB: 40 mg | ORAL | 90 days supply | Qty: 90 | Fill #2 | Status: AC

## 2020-02-26 MED FILL — MYCOPHENOLATE SODIUM 180 MG PO TBEC: 180 mg | ORAL | 30 days supply | Qty: 120 | Fill #7 | Status: AC

## 2020-03-05 ENCOUNTER — Encounter: Admit: 2020-03-05 | Discharge: 2020-03-05 | Payer: MEDICARE

## 2020-03-05 DIAGNOSIS — Z940 Kidney transplant status: Secondary | ICD-10-CM

## 2020-03-05 DIAGNOSIS — D849 Immunodeficiency, unspecified: Secondary | ICD-10-CM

## 2020-03-05 DIAGNOSIS — Z79899 Other long term (current) drug therapy: Secondary | ICD-10-CM

## 2020-03-06 ENCOUNTER — Encounter: Admit: 2020-03-06 | Discharge: 2020-03-06 | Payer: MEDICARE

## 2020-03-06 DIAGNOSIS — Z79899 Other long term (current) drug therapy: Secondary | ICD-10-CM

## 2020-03-06 DIAGNOSIS — D849 Immunodeficiency, unspecified: Secondary | ICD-10-CM

## 2020-03-06 DIAGNOSIS — Z940 Kidney transplant status: Secondary | ICD-10-CM

## 2020-03-06 LAB — URINALYSIS DIPSTICK REFLEX TO CULTURE
Lab: 0.2
Lab: 1 — ABNORMAL HIGH (ref 9.8–20.1)
Lab: 6 — ABNORMAL HIGH (ref 0–14)
Lab: NEGATIVE
Lab: NEGATIVE
Lab: NEGATIVE
Lab: NEGATIVE mg/dL — ABNORMAL HIGH (ref 0.57–1.11)
Lab: NEGATIVE mg/dL — ABNORMAL LOW (ref 1.9–8.1)
Lab: NEGATIVE — AB (ref >59)

## 2020-03-06 LAB — URIC ACID: Lab: 7.4 — ABNORMAL HIGH (ref 2.6–6.0)

## 2020-03-06 LAB — MAGNESIUM: Lab: 1.8

## 2020-03-06 LAB — COMPREHENSIVE METABOLIC PANEL
Lab: 140 mmol/L
Lab: 4.3
Lab: 7

## 2020-03-06 LAB — CBC AND DIFF
Lab: 11 — ABNORMAL LOW (ref 12.0–16.0)
Lab: 3.1 — ABNORMAL LOW (ref 4.8–10.8)

## 2020-03-06 LAB — PROTEIN/CR RATIO,UR RAN: Lab: 32 — ABNORMAL LOW (ref 47–110)

## 2020-03-06 LAB — PHOSPHORUS: Lab: 5.2 — ABNORMAL HIGH (ref 2.3–4.7)

## 2020-03-07 ENCOUNTER — Encounter: Admit: 2020-03-07 | Discharge: 2020-03-07 | Payer: MEDICARE

## 2020-03-07 DIAGNOSIS — D849 Immunodeficiency, unspecified: Secondary | ICD-10-CM

## 2020-03-07 DIAGNOSIS — Z940 Kidney transplant status: Secondary | ICD-10-CM

## 2020-03-07 DIAGNOSIS — Z79899 Other long term (current) drug therapy: Secondary | ICD-10-CM

## 2020-03-07 LAB — TACROLIMUS LC-MS/MS: Lab: 6.4 ug/L (ref 5.0–20.0)

## 2020-03-07 MED FILL — TACROLIMUS 1 MG PO TB24: 1 mg | ORAL | 30 days supply | Qty: 60 | Fill #10 | Status: AC

## 2020-03-13 ENCOUNTER — Encounter: Admit: 2020-03-13 | Discharge: 2020-03-13 | Payer: MEDICARE

## 2020-03-13 DIAGNOSIS — Z940 Kidney transplant status: Secondary | ICD-10-CM

## 2020-03-13 LAB — BK VIRUS DNA, QUANT PLASMA

## 2020-03-13 LAB — CMV QUANT PCR-BLOOD

## 2020-03-14 ENCOUNTER — Encounter: Admit: 2020-03-14 | Discharge: 2020-03-14 | Payer: MEDICARE

## 2020-03-14 NOTE — Telephone Encounter
Dr. Verline Lema reviewed labs from 03/05/2020 at this time    Hgb 11.3  WBC 3.1  Creatinine 1.66  Mg 1.8  Phos 5.2  Tacrolimus 6.4 (Goal 4-8)  BK not detected   UPC  0.23    Received the following orders from Dr. Verline Lema:  Labs every two months  No change in immunosuppresion    TC to patient x 2 with no answer.

## 2020-03-15 ENCOUNTER — Encounter: Admit: 2020-03-15 | Discharge: 2020-03-15 | Payer: MEDICARE

## 2020-03-15 MED FILL — METFORMIN 500 MG PO TB24: 500 mg | ORAL | 90 days supply | Qty: 180 | Fill #2 | Status: AC

## 2020-03-18 ENCOUNTER — Encounter: Admit: 2020-03-18 | Discharge: 2020-03-18 | Payer: MEDICARE

## 2020-03-18 MED FILL — PANTOPRAZOLE 40 MG PO TBEC: 40 mg | ORAL | 90 days supply | Qty: 180 | Fill #2 | Status: AC

## 2020-04-09 ENCOUNTER — Encounter: Admit: 2020-04-09 | Discharge: 2020-04-09 | Payer: MEDICARE

## 2020-04-09 MED FILL — MYCOPHENOLATE SODIUM 180 MG PO TBEC: 180 mg | ORAL | 30 days supply | Qty: 120 | Fill #8 | Status: CP

## 2020-04-09 MED FILL — MYCOPHENOLATE SODIUM 180 MG PO TBEC: 180 mg | ORAL | 30 days supply | Qty: 120 | Status: CN

## 2020-04-12 ENCOUNTER — Encounter: Admit: 2020-04-12 | Discharge: 2020-04-12 | Payer: MEDICARE

## 2020-04-12 MED FILL — TACROLIMUS 1 MG PO TB24: 1 mg | ORAL | 30 days supply | Qty: 30 | Fill #11 | Status: AC

## 2020-04-29 ENCOUNTER — Encounter: Admit: 2020-04-29 | Discharge: 2020-04-29 | Payer: MEDICARE

## 2020-04-30 MED FILL — ROSUVASTATIN 10 MG PO TAB: 10 mg | ORAL | 90 days supply | Qty: 90 | Fill #3 | Status: AC

## 2020-05-06 MED FILL — MYCOPHENOLATE SODIUM 180 MG PO TBEC: 180 mg | ORAL | 30 days supply | Qty: 120 | Fill #9 | Status: AC

## 2020-05-13 MED FILL — TACROLIMUS 1 MG PO TB24: 1 mg | ORAL | 30 days supply | Qty: 30 | Fill #12 | Status: AC

## 2020-05-28 ENCOUNTER — Encounter: Admit: 2020-05-28 | Discharge: 2020-05-28 | Payer: MEDICARE

## 2020-05-28 MED FILL — LISINOPRIL 40 MG PO TAB: 40 mg | ORAL | 90 days supply | Qty: 90 | Fill #3 | Status: AC

## 2020-06-03 ENCOUNTER — Encounter: Admit: 2020-06-03 | Discharge: 2020-06-03 | Payer: MEDICARE

## 2020-06-03 MED FILL — MYCOPHENOLATE SODIUM 180 MG PO TBEC: 180 mg | ORAL | 30 days supply | Qty: 120 | Fill #10 | Status: AC

## 2020-06-07 ENCOUNTER — Encounter: Admit: 2020-06-07 | Discharge: 2020-06-07 | Payer: MEDICARE

## 2020-06-07 DIAGNOSIS — Z940 Kidney transplant status: Secondary | ICD-10-CM

## 2020-06-07 DIAGNOSIS — D849 Immunodeficiency, unspecified: Secondary | ICD-10-CM

## 2020-06-07 DIAGNOSIS — Z79899 Other long term (current) drug therapy: Secondary | ICD-10-CM

## 2020-06-10 IMAGING — MG MAMMOGRAM, DIGITAL SCREEN BILA
1 series · 5 of 5 positions shown · non-contrast
Comparison: none

[Series 2: R CC · right · 5 of 5 slices shown]
[im 1/5]
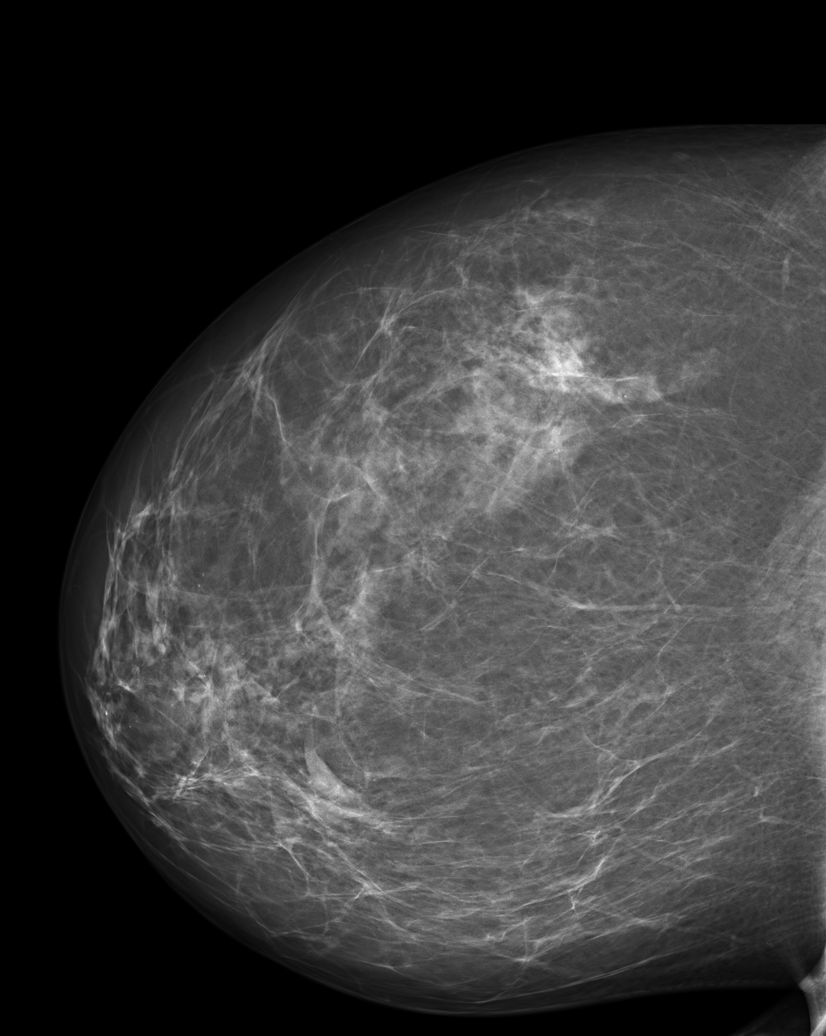
[im 2/5]
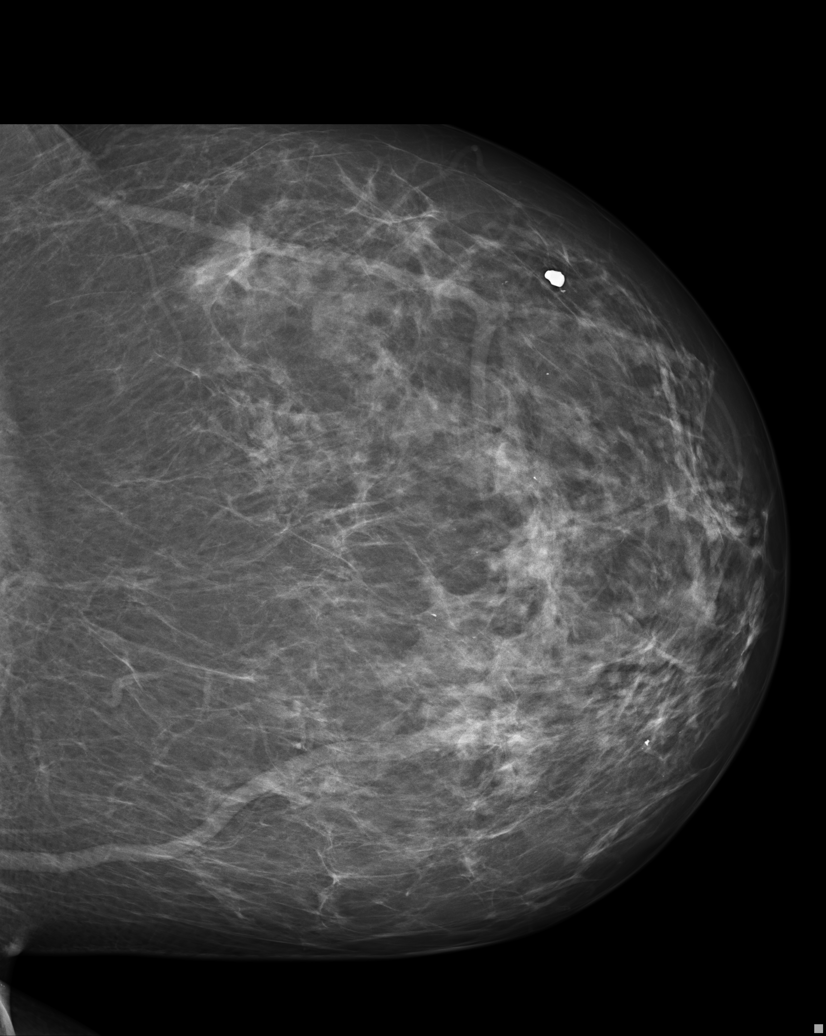
[im 3/5]
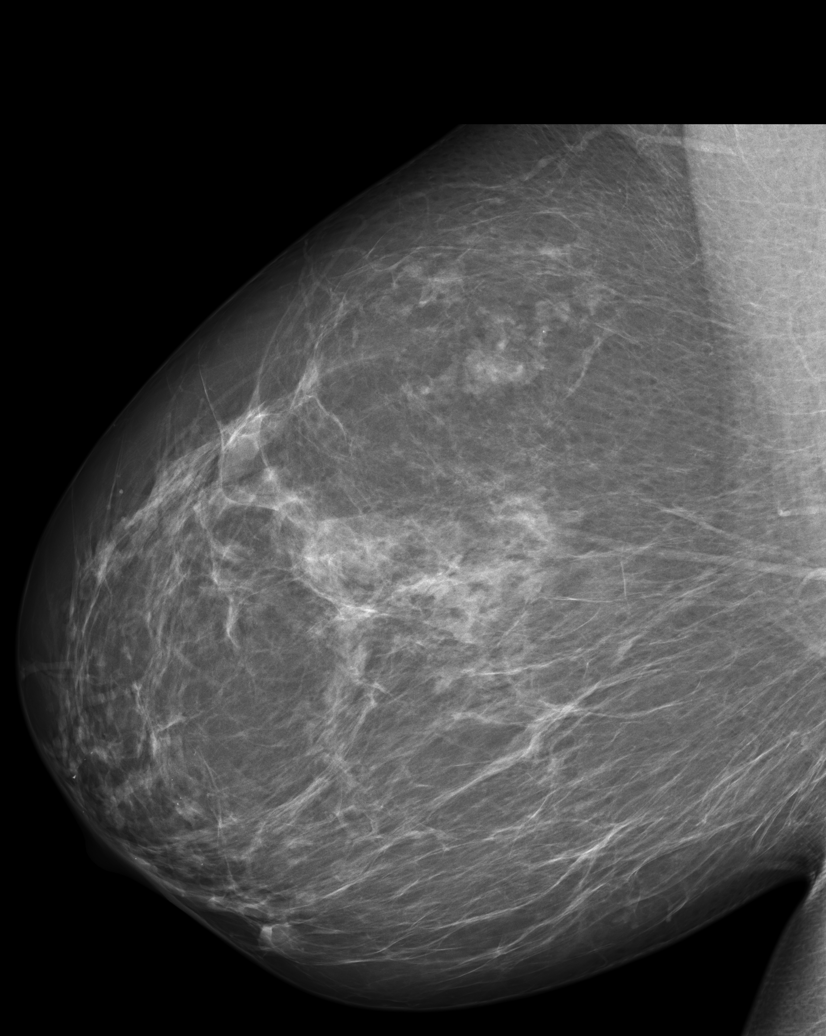
[im 4/5]
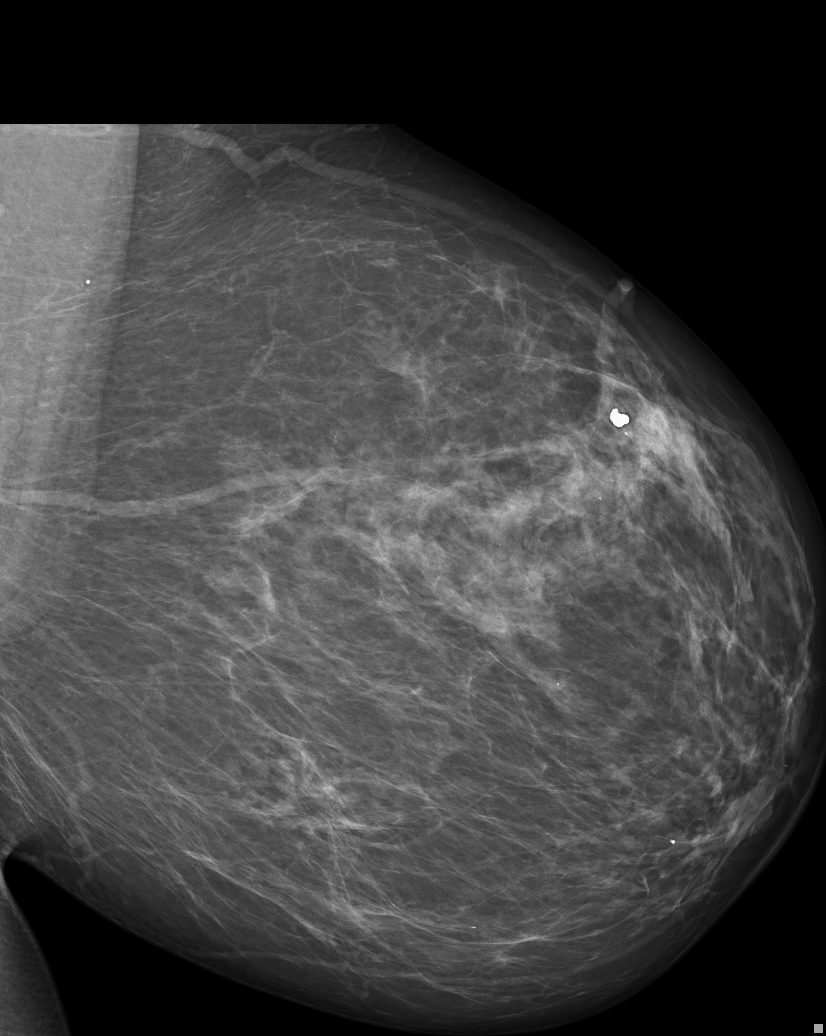
[im 5/5]
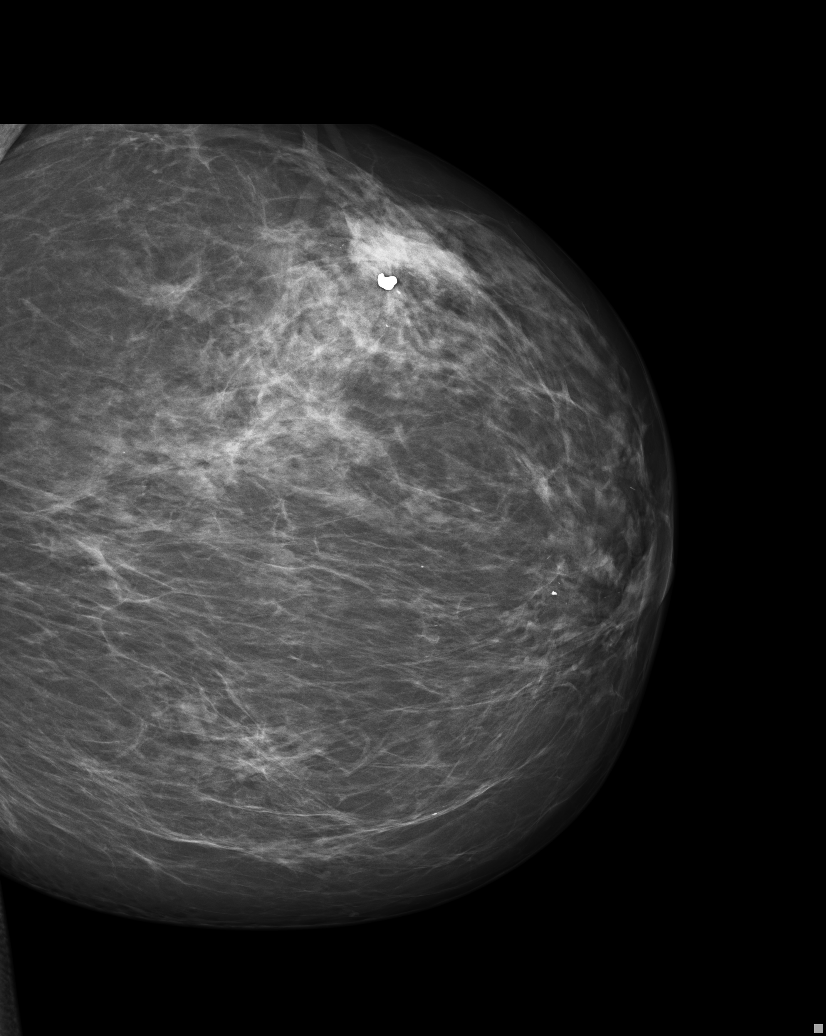

[5 of 5 positions shown; findings below may reference images not displayed]

EXAM
SCREENING MAMMOGRAM, BILATERAL

INDICATION
screening
SCREENING. AB (BW-3S CHOSE) PRIORS: 6982, 3995.

TECHNIQUE
Bilateral craniocaudal and medial lateral oblique views were generated and reviewed with computer-
aided detection.

COMPARISONS
March 19, 2017

FINDINGS
ACR Type 2: 25-50% There are scattered fibroglandular densities.
No concerning masses, calcifications, or interval changes are seen.

IMPRESSION
Stable bilateral mammography. One year follow-up is recommended.
BI-RADS 2, BENIGN.

Tech Notes:

## 2020-06-11 ENCOUNTER — Encounter: Admit: 2020-06-11 | Discharge: 2020-06-11 | Payer: MEDICARE

## 2020-06-11 DIAGNOSIS — Z79899 Other long term (current) drug therapy: Secondary | ICD-10-CM

## 2020-06-11 DIAGNOSIS — D849 Immunodeficiency, unspecified: Secondary | ICD-10-CM

## 2020-06-11 DIAGNOSIS — Z940 Kidney transplant status: Secondary | ICD-10-CM

## 2020-06-11 LAB — URINALYSIS MICROSCOPIC REFLEX TO CULTURE

## 2020-06-11 LAB — MAGNESIUM: Lab: 1.7

## 2020-06-11 LAB — URINALYSIS DIPSTICK REFLEX TO CULTURE
Lab: 0.2
Lab: NEGATIVE
Lab: NEGATIVE — ABNORMAL HIGH (ref 70–105)
Lab: NEGATIVE — ABNORMAL LOW (ref >59)

## 2020-06-11 LAB — COMPREHENSIVE METABOLIC PANEL
Lab: 0.3 mg/dL
Lab: 1.5 mg/dL — ABNORMAL HIGH (ref <0.2)
Lab: 139 mmol/L
Lab: 4.2
Lab: 4.4
Lab: 7.1

## 2020-06-11 LAB — PROTEIN/CR RATIO,UR RAN
Lab: 22 — ABNORMAL LOW (ref 47–110)
Lab: 7.5 — AB (ref 9.8–20.1)

## 2020-06-11 LAB — URIC ACID: Lab: 7.1 — ABNORMAL HIGH (ref 2.6–6.0)

## 2020-06-11 LAB — PHOSPHORUS: Lab: 4.2

## 2020-06-13 ENCOUNTER — Encounter: Admit: 2020-06-13 | Discharge: 2020-06-13 | Payer: MEDICARE

## 2020-06-13 NOTE — Telephone Encounter
Assumed care from N. Engineer, production.  Chart reviewed.  Pt last seen 11/06/19  She will need appointment for yearly follow up scheduled with Dr Verline Lema in person or tele health in long term clinic 10/2020.  Routed to [facility]. [place] admin assist.  Last labs 06/07/19  Cr stable 1.51.  Tacrolimus level pending.  Hgb/Hct not seen.  Attempted to call [place] lab.  No answer    Per Dr Jacquelin Hawking, CO2 17.  Increase diet in fruits and vegetables.  If persistent consider sodium bicarbonate.    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  Hx of PTDM  Metformin  HTN:  Coreg and lisinopril  Current immunosuppression:  Envarsus and myfortic    01/22/20 Dermatology:  Shave bx right FA.  Benign    Called pt and introduced self.  Discussed low CO2 and diet changes.  Pt states blood pressure is well controlled and she has no needs at this time.

## 2020-06-17 ENCOUNTER — Encounter: Admit: 2020-06-17 | Discharge: 2020-06-17 | Payer: MEDICARE

## 2020-06-17 NOTE — Telephone Encounter
In-person appointment scheduled with Dr. Verline Lema on 11/05/20 at 8:20am. Labs at 8:00am.

## 2020-06-26 ENCOUNTER — Encounter: Admit: 2020-06-26 | Discharge: 2020-06-26 | Payer: MEDICARE

## 2020-06-26 DIAGNOSIS — Z940 Kidney transplant status: Secondary | ICD-10-CM

## 2020-06-26 DIAGNOSIS — D849 Immunodeficiency, unspecified: Secondary | ICD-10-CM

## 2020-06-26 MED ORDER — TACROLIMUS 1 MG PO TB24
1 mg | ORAL_TABLET | Freq: Every day | ORAL | 11 refills
Start: 2020-06-26 — End: ?

## 2020-06-26 MED FILL — MYCOPHENOLATE SODIUM 180 MG PO TBEC: 180 mg | ORAL | 30 days supply | Qty: 120 | Fill #11 | Status: AC

## 2020-06-28 ENCOUNTER — Encounter: Admit: 2020-06-28 | Discharge: 2020-06-28 | Payer: MEDICARE

## 2020-07-02 ENCOUNTER — Encounter: Admit: 2020-07-02 | Discharge: 2020-07-02 | Payer: MEDICARE

## 2020-07-05 ENCOUNTER — Encounter: Admit: 2020-07-05 | Discharge: 2020-07-05 | Payer: MEDICARE

## 2020-07-05 DIAGNOSIS — Z79899 Other long term (current) drug therapy: Secondary | ICD-10-CM

## 2020-07-05 DIAGNOSIS — D849 Immunodeficiency, unspecified: Secondary | ICD-10-CM

## 2020-07-05 DIAGNOSIS — Z940 Kidney transplant status: Secondary | ICD-10-CM

## 2020-07-05 LAB — COMPREHENSIVE METABOLIC PANEL
Lab: 130 U/L (ref 40–150)
Lab: 139 mmol/L (ref 136–146)
Lab: 4.2 g/dL (ref 3.5–5.0)
Lab: 6.9 g/dL (ref 6.4–8.3)

## 2020-07-05 LAB — URINALYSIS DIPSTICK REFLEX TO CULTURE
Lab: 0.2 U/L (ref 0.2–1.0)
Lab: NEGATIVE mL/Min/1.73 — ABNORMAL LOW
Lab: NEGATIVE mg/dL (ref 0.2–1.2)
Lab: NEGATIVE mg/dL (ref 8.4–10.2)
Lab: NEGATIVE mg/dL — ABNORMAL HIGH (ref 0.57–1.11)

## 2020-07-05 LAB — PROTEIN/CR RATIO,UR RAN
Lab: 0.2 /HPF — ABNORMAL HIGH (ref <0.2)
Lab: 34 mg/dL — ABNORMAL LOW (ref 47–110)
Lab: 7.5 mg/dL — AB (ref 1–14)

## 2020-07-05 LAB — URINALYSIS MICROSCOPIC REFLEX TO CULTURE

## 2020-07-05 LAB — URIC ACID: Lab: 6.6 — ABNORMAL HIGH (ref 2.6–6.0)

## 2020-07-05 LAB — MAGNESIUM: Lab: 1.6 mg/dL (ref 1.6–2.6)

## 2020-07-08 ENCOUNTER — Encounter: Admit: 2020-07-08 | Discharge: 2020-07-08 | Payer: MEDICARE

## 2020-07-08 DIAGNOSIS — Z940 Kidney transplant status: Secondary | ICD-10-CM

## 2020-07-08 DIAGNOSIS — D849 Immunodeficiency, unspecified: Secondary | ICD-10-CM

## 2020-07-08 MED ORDER — TACROLIMUS 1 MG PO TB24
1 mg | ORAL_TABLET | Freq: Every day | ORAL | 11 refills | 30.00000 days | Status: AC
Start: 2020-07-08 — End: ?
  Filled 2020-09-09: qty 30, 30d supply, fill #1

## 2020-07-08 NOTE — Telephone Encounter
Received a medication request:    Medication Requested: Envarsus    Pharmacy Used: [facility]    Days of medication left: 0    Callback person and number for questions:     Also patient need new Standing Lab Order sent to [facility] her orders are about to expire.

## 2020-07-08 NOTE — Telephone Encounter
Reviewed labs from 07/05/20.  Cr 1.54.  Tacrolimus level pending.  New standing lab orders faxed to [facility] and Envarsus refilled.  Attempted to call pt.  Voice mail not set up.  Emailed pt copy of lab orders.

## 2020-07-09 ENCOUNTER — Encounter: Admit: 2020-07-09 | Discharge: 2020-07-09 | Payer: MEDICARE

## 2020-07-09 DIAGNOSIS — Z940 Kidney transplant status: Secondary | ICD-10-CM

## 2020-07-09 DIAGNOSIS — D849 Immunodeficiency, unspecified: Secondary | ICD-10-CM

## 2020-07-09 DIAGNOSIS — Z79899 Other long term (current) drug therapy: Secondary | ICD-10-CM

## 2020-07-09 LAB — TACROLIMUS LC-MS/MS: Lab: 6.6 MMOL/L — ABNORMAL LOW (ref >60)

## 2020-07-17 ENCOUNTER — Encounter: Admit: 2020-07-17 | Discharge: 2020-07-17 | Payer: MEDICARE

## 2020-07-18 ENCOUNTER — Encounter: Admit: 2020-07-18 | Discharge: 2020-07-18 | Payer: MEDICARE

## 2020-07-18 NOTE — Progress Notes
PA submitted for Envarsus. Awaiting response.

## 2020-07-19 ENCOUNTER — Encounter: Admit: 2020-07-19 | Discharge: 2020-07-19 | Payer: MEDICARE

## 2020-07-19 NOTE — Progress Notes
PA submitted for Envarsus. Awaiting response. May take up to 72 hrs.

## 2020-07-21 ENCOUNTER — Encounter: Admit: 2020-07-21 | Discharge: 2020-07-21 | Payer: MEDICARE

## 2020-07-22 NOTE — Telephone Encounter
Labs reviewed by Dr Verline Lema.  Tacrolimus level at goal.  Cr stable.  Decrease labs to q 3 months per Dr Verline Lema.

## 2020-07-23 ENCOUNTER — Encounter: Admit: 2020-07-23 | Discharge: 2020-07-23 | Payer: MEDICARE

## 2020-07-24 ENCOUNTER — Encounter: Admit: 2020-07-24 | Discharge: 2020-07-24 | Payer: MEDICARE

## 2020-07-24 MED FILL — MYCOPHENOLATE SODIUM 180 MG PO TBEC: 180 mg | ORAL | 30 days supply | Qty: 120 | Fill #12 | Status: AC

## 2020-07-29 ENCOUNTER — Encounter: Admit: 2020-07-29 | Discharge: 2020-07-29 | Payer: MEDICARE

## 2020-07-30 MED FILL — ROSUVASTATIN 10 MG PO TAB: 10 mg | ORAL | 90 days supply | Qty: 90 | Fill #4 | Status: AC

## 2020-08-15 ENCOUNTER — Encounter: Admit: 2020-08-15 | Discharge: 2020-08-15 | Payer: MEDICARE

## 2020-08-15 NOTE — Telephone Encounter
Received an incoming call from:    Caller Name: Pam     Relationship to Patient: self     Callback Number: on file     Purpose of Call: Pt calling to inform that she has discontinued some of her medications, Please call to discuss.   Thanks

## 2020-08-15 NOTE — Telephone Encounter
Pt called to report she had to stop potassium citrate due to diarrhea and upset stomach.  She stopped it on 08/11/20.  Symptoms have resolved.  Notified pt labs were every 3 months now.  Routed update to Dr Verline Lema.

## 2020-08-28 ENCOUNTER — Encounter: Admit: 2020-08-28 | Discharge: 2020-08-28 | Payer: MEDICARE

## 2020-08-28 MED FILL — METFORMIN 500 MG PO TB24: 500 mg | ORAL | 90 days supply | Qty: 180 | Fill #3 | Status: AC

## 2020-08-30 ENCOUNTER — Encounter: Admit: 2020-08-30 | Discharge: 2020-08-30 | Payer: MEDICARE

## 2020-08-30 NOTE — Telephone Encounter
Received an incoming call from:    Jensen Name: Manny     Relationship to Patient: none    Callback Number: see above     Purpose of Call: [place] needs the billing code for the testing .  Please callback with information

## 2020-09-03 ENCOUNTER — Encounter: Admit: 2020-09-03 | Discharge: 2020-09-03 | Payer: MEDICARE

## 2020-09-03 DIAGNOSIS — D849 Immunodeficiency, unspecified: Secondary | ICD-10-CM

## 2020-09-03 DIAGNOSIS — Z940 Kidney transplant status: Secondary | ICD-10-CM

## 2020-09-03 MED ORDER — MYCOPHENOLATE SODIUM 180 MG PO TBEC
360 mg | ORAL_TABLET | Freq: Two times a day (BID) | ORAL | 11 refills | Status: CN
Start: 2020-09-03 — End: ?

## 2020-09-03 MED ORDER — TACROLIMUS 1 MG PO TB24
1 mg | ORAL_TABLET | Freq: Every day | ORAL | 11 refills | Status: CN
Start: 2020-09-03 — End: ?

## 2020-09-03 MED FILL — LISINOPRIL 40 MG PO TAB: 40 mg | ORAL | 90 days supply | Qty: 90 | Fill #4 | Status: CP

## 2020-09-05 ENCOUNTER — Encounter: Admit: 2020-09-05 | Discharge: 2020-09-05 | Payer: MEDICARE

## 2020-09-05 MED ORDER — MYCOPHENOLATE SODIUM 180 MG PO TBEC
360 mg | ORAL_TABLET | Freq: Two times a day (BID) | ORAL | 11 refills | Status: AC
Start: 2020-09-05 — End: ?
  Filled 2020-09-05: qty 120, 30d supply, fill #1

## 2020-09-05 NOTE — Telephone Encounter
Received a medication request:    Medication Requested: myfortic   Pharmacy Used: [facility]     Days of medication left: a few    Callback person and number for questions: please order a refill for meds . She don't want to run out of meds before the week

## 2020-09-09 ENCOUNTER — Encounter: Admit: 2020-09-09 | Discharge: 2020-09-09 | Payer: MEDICARE

## 2020-09-16 ENCOUNTER — Encounter: Admit: 2020-09-16 | Discharge: 2020-09-16 | Payer: MEDICARE

## 2020-09-25 ENCOUNTER — Encounter: Admit: 2020-09-25 | Discharge: 2020-09-25 | Payer: MEDICARE

## 2020-09-25 DIAGNOSIS — Z940 Kidney transplant status: Secondary | ICD-10-CM

## 2020-09-25 DIAGNOSIS — D849 Immunodeficiency, unspecified: Secondary | ICD-10-CM

## 2020-09-25 MED ORDER — CARVEDILOL 25 MG PO TAB
25 mg | ORAL_TABLET | Freq: Two times a day (BID) | ORAL | 3 refills | 90.00000 days | Status: AC
Start: 2020-09-25 — End: ?
  Filled 2020-09-26: qty 180, 90d supply, fill #1

## 2020-09-25 MED ORDER — METFORMIN 500 MG PO TB24
1000 mg | ORAL_TABLET | Freq: Every day | ORAL | 3 refills | Status: CN
Start: 2020-09-25 — End: ?

## 2020-09-25 NOTE — Telephone Encounter
Received a medication request:    Medication Requested:  2 meds   Pharmacy Used: [facility]     Days of medication left: none    Callback person and number for questions: pt is Rx has expired and she needs a refill   Please call to dicsuss

## 2020-09-27 ENCOUNTER — Encounter: Admit: 2020-09-27 | Discharge: 2020-09-27 | Payer: MEDICARE

## 2020-09-30 ENCOUNTER — Encounter: Admit: 2020-09-30 | Discharge: 2020-09-30 | Payer: MEDICARE

## 2020-09-30 DIAGNOSIS — D849 Immunodeficiency, unspecified: Secondary | ICD-10-CM

## 2020-09-30 DIAGNOSIS — Z940 Kidney transplant status: Secondary | ICD-10-CM

## 2020-09-30 MED ORDER — PANTOPRAZOLE 40 MG PO TBEC
40 mg | ORAL_TABLET | Freq: Every day | ORAL | 3 refills | 90.00000 days | Status: AC
Start: 2020-09-30 — End: ?
  Filled 2020-10-01: qty 90, 90d supply, fill #1

## 2020-09-30 MED FILL — MYCOPHENOLATE SODIUM 180 MG PO TBEC: 180 mg | ORAL | 30 days supply | Qty: 120 | Fill #2 | Status: AC

## 2020-09-30 NOTE — Telephone Encounter
Received a medication request:    Medication Requested: Platronics  Pharmacy Used: unknown    Days of medication left: -0-    Callback person and number for questions: Spouse is calling to get a refill on Rx has expired.    Thanks

## 2020-10-04 ENCOUNTER — Encounter: Admit: 2020-10-04 | Discharge: 2020-10-04 | Payer: MEDICARE

## 2020-10-06 ENCOUNTER — Encounter: Admit: 2020-10-06 | Discharge: 2020-10-06 | Payer: MEDICARE

## 2020-10-07 MED FILL — TACROLIMUS 1 MG PO TB24: 1 mg | ORAL | 30 days supply | Qty: 30 | Fill #2 | Status: AC

## 2020-10-08 ENCOUNTER — Encounter: Admit: 2020-10-08 | Discharge: 2020-10-08 | Payer: MEDICARE

## 2020-10-08 DIAGNOSIS — Z940 Kidney transplant status: Secondary | ICD-10-CM

## 2020-10-08 DIAGNOSIS — D849 Immunodeficiency, unspecified: Secondary | ICD-10-CM

## 2020-10-08 LAB — CBC AND DIFF
ABSOLUTE BASO COUNT: 0 x10-3/uL (ref 0.0–0.2)
ABSOLUTE EOS COUNT: 0 x10-3/uL (ref 0.0–0.6)
ABSOLUTE LYMPH COUNT: 0.7 x10-3/uL — ABNORMAL LOW (ref 0.9–5.1)
ABSOLUTE NEUTROPHIL: 2.6 /uL (ref 40–150)
NEUTROPHILS %: 67 % — ABNORMAL HIGH (ref 40.0–75.0)
RBC COUNT: 4 uL — ABNORMAL LOW (ref 4.20–5.40)
WBC COUNT: 3.9 uL — ABNORMAL LOW (ref 4.8–10.8)

## 2020-10-08 LAB — COMPREHENSIVE METABOLIC PANEL
ALT: 13 U/L (ref 0–55)
ANION GAP: 15 pg — ABNORMAL HIGH (ref 0–14)
AST: 17 U/L (ref 5–34)
BLD UREA NITROGEN: 24 mg/dL — ABNORMAL HIGH (ref 9.8–20.1)
CALCIUM: 9.7 mg/dL (ref 8.4–10.2)
CHLORIDE: 112 mmol/L — ABNORMAL HIGH (ref 98–107)
CO2: 18 mmol/L — ABNORMAL LOW (ref 22–29)
CREATININE: 1.4 mg/dL — ABNORMAL HIGH (ref 0.57–1.11)
GFR ESTIMATED: 40 x10-3/uL — ABNORMAL LOW (ref >59)
POTASSIUM: 4.5 mmol/L (ref 3.5–5.1)
TOTAL BILIRUBIN: 0.6 mg/dL — ABNORMAL HIGH (ref 0.2–1.2)

## 2020-10-08 LAB — PHOSPHORUS: PHOSPHORUS: 4.1 (ref 2.3–4.7)

## 2020-10-13 ENCOUNTER — Encounter: Admit: 2020-10-13 | Discharge: 2020-10-13 | Payer: MEDICARE

## 2020-10-14 NOTE — Telephone Encounter
Reviewed labs from 10/08/20.  WBC 3.9.  Cr stable 1.42.  Need prograf level from [facility].

## 2020-10-23 ENCOUNTER — Encounter: Admit: 2020-10-23 | Discharge: 2020-10-23 | Payer: MEDICARE

## 2020-10-23 MED ORDER — ROSUVASTATIN 10 MG PO TAB
10 mg | ORAL_TABLET | Freq: Every evening | ORAL | 3 refills | 90.00000 days | Status: AC
Start: 2020-10-23 — End: ?
  Filled 2020-10-23: qty 90, 90d supply, fill #1

## 2020-10-23 NOTE — Telephone Encounter
Received a medication request:    Medication Requested: Michele Benitez Used: [facility]    Days of medication left: 0    Callback person and number for questions:  Patient would like a call back when it is called in so that she can go pick it up.

## 2020-10-24 ENCOUNTER — Encounter: Admit: 2020-10-24 | Discharge: 2020-10-24 | Payer: MEDICARE

## 2020-10-24 MED FILL — MYCOPHENOLATE SODIUM 180 MG PO TBEC: 180 mg | ORAL | 30 days supply | Qty: 120 | Fill #3 | Status: AC

## 2020-10-31 ENCOUNTER — Encounter: Admit: 2020-10-31 | Discharge: 2020-10-31 | Payer: MEDICARE

## 2020-11-03 ENCOUNTER — Encounter: Admit: 2020-11-03 | Discharge: 2020-11-03 | Payer: MEDICARE

## 2020-11-03 MED FILL — TACROLIMUS 1 MG PO TB24: 1 mg | ORAL | 30 days supply | Qty: 30 | Fill #3 | Status: AC

## 2020-11-05 ENCOUNTER — Encounter: Admit: 2020-11-05 | Discharge: 2020-11-05 | Payer: MEDICARE

## 2020-11-06 ENCOUNTER — Encounter: Admit: 2020-11-06 | Discharge: 2020-11-06 | Payer: MEDICARE

## 2020-11-08 ENCOUNTER — Encounter: Admit: 2020-11-08 | Discharge: 2020-11-08 | Payer: MEDICARE

## 2020-11-15 ENCOUNTER — Encounter: Admit: 2020-11-15 | Discharge: 2020-11-15 | Payer: MEDICARE

## 2020-11-15 NOTE — Telephone Encounter
[facility] for tacrolimus level from 10/08/20.  They are faxing results.  Reviewed results with pt.  No changes in plan of care.

## 2020-11-15 NOTE — Telephone Encounter
Received an incoming call from:    [place]man Name: Jerah    Relationship to Patient: self    Callback Number: [phone]    Purpose of Call: Wanted to go over her most recent lab results from July.

## 2020-11-26 ENCOUNTER — Encounter: Admit: 2020-11-26 | Discharge: 2020-11-26 | Payer: MEDICARE

## 2020-11-26 DIAGNOSIS — Z940 Kidney transplant status: Secondary | ICD-10-CM

## 2020-11-26 DIAGNOSIS — D849 Immunodeficiency, unspecified: Secondary | ICD-10-CM

## 2020-11-26 MED ORDER — LISINOPRIL 40 MG PO TAB
40 mg | ORAL_TABLET | Freq: Every evening | ORAL | 3 refills
Start: 2020-11-26 — End: ?

## 2020-11-26 MED FILL — METFORMIN 500 MG PO TB24: 500 mg | ORAL | 90 days supply | Qty: 180 | Fill #1 | Status: AC

## 2020-11-27 ENCOUNTER — Encounter: Admit: 2020-11-27 | Discharge: 2020-11-27 | Payer: MEDICARE

## 2020-11-27 MED FILL — LISINOPRIL 40 MG PO TAB: 40 mg | ORAL | 90 days supply | Qty: 90 | Fill #1 | Status: AC

## 2020-12-02 ENCOUNTER — Encounter: Admit: 2020-12-02 | Discharge: 2020-12-02 | Payer: MEDICARE

## 2020-12-03 MED FILL — MYCOPHENOLATE SODIUM 180 MG PO TBEC: 180 mg | ORAL | 30 days supply | Qty: 120 | Fill #4 | Status: AC

## 2020-12-09 ENCOUNTER — Encounter: Admit: 2020-12-09 | Discharge: 2020-12-09 | Payer: MEDICARE

## 2020-12-09 MED FILL — TACROLIMUS 1 MG PO TB24: 1 mg | ORAL | 30 days supply | Qty: 30 | Fill #4 | Status: AC

## 2020-12-23 ENCOUNTER — Encounter: Admit: 2020-12-23 | Discharge: 2020-12-23 | Payer: MEDICARE

## 2020-12-23 MED FILL — CARVEDILOL 25 MG PO TAB: 25 mg | ORAL | 90 days supply | Qty: 180 | Fill #2 | Status: AC

## 2020-12-30 ENCOUNTER — Encounter: Admit: 2020-12-30 | Discharge: 2020-12-30 | Payer: MEDICARE

## 2020-12-31 ENCOUNTER — Encounter: Admit: 2020-12-31 | Discharge: 2020-12-31 | Payer: MEDICARE

## 2020-12-31 ENCOUNTER — Ambulatory Visit: Admit: 2020-12-31 | Discharge: 2020-12-31 | Payer: MEDICARE

## 2020-12-31 DIAGNOSIS — Z79899 Other long term (current) drug therapy: Secondary | ICD-10-CM

## 2020-12-31 DIAGNOSIS — Z940 Kidney transplant status: Secondary | ICD-10-CM

## 2020-12-31 DIAGNOSIS — D849 Immunodeficiency, unspecified: Secondary | ICD-10-CM

## 2020-12-31 DIAGNOSIS — E785 Hyperlipidemia, unspecified: Secondary | ICD-10-CM

## 2020-12-31 DIAGNOSIS — N186 End stage renal disease: Secondary | ICD-10-CM

## 2020-12-31 DIAGNOSIS — E139 Other specified diabetes mellitus without complications: Secondary | ICD-10-CM

## 2020-12-31 DIAGNOSIS — I10 Essential (primary) hypertension: Secondary | ICD-10-CM

## 2020-12-31 DIAGNOSIS — R9439 Abnormal result of other cardiovascular function study: Secondary | ICD-10-CM

## 2020-12-31 LAB — COMPREHENSIVE METABOLIC PANEL
ALBUMIN: 4.9 g/dL (ref 3.5–5.0)
AST: 17 U/L (ref 7–40)
CREATININE: 1.3 mg/dL — ABNORMAL HIGH (ref 0.4–1.00)
GLUCOSE,PANEL: 112 mg/dL — ABNORMAL HIGH (ref 70–100)
POTASSIUM: 4.3 MMOL/L (ref 3.5–5.1)
SODIUM: 141 MMOL/L (ref 137–147)
TOTAL PROTEIN: 6.8 g/dL (ref 6.0–8.0)

## 2020-12-31 LAB — PHOSPHORUS: PHOSPHORUS: 4 mg/dL (ref 2.0–4.5)

## 2020-12-31 LAB — HEMOGLOBIN A1C: HEMOGLOBIN A1C: 6.1 % — ABNORMAL HIGH (ref 4.0–6.0)

## 2020-12-31 LAB — PROTEIN/CR RATIO,UR RAN
PROT CREAT RAT/CAL: 0.1 (ref <0.15)
UR CREATININE, RAN: 33 mg/dL
UR TOTAL PROTEIN,RAN: 4 mg/dL

## 2020-12-31 LAB — MAGNESIUM: MAGNESIUM: 1.6 mg/dL — ABNORMAL HIGH (ref 1.6–2.6)

## 2020-12-31 LAB — CBC AND DIFF
RBC COUNT: 3.9 M/UL — ABNORMAL LOW (ref 4.0–5.0)
WBC COUNT: 4.5 K/UL (ref 4.5–11.0)

## 2020-12-31 LAB — FOLATE, SERUM: SERUM FOLATE: >22 ng/mL (ref >3.9)

## 2020-12-31 LAB — LIPID PROFILE
CHOLESTEROL: 137 mg/dL (ref <200)
LDL: 58 mg/dL — ABNORMAL LOW (ref <100)
NON HDL CHOLESTEROL: 85 mg/dL (ref 0–0.20)
TRIGLYCERIDES: 172 mg/dL — ABNORMAL HIGH (ref <150)

## 2020-12-31 LAB — THYROID STIMULATING HORMONE-TSH: TSH: 1.3 uU/mL (ref 0.35–5.00)

## 2020-12-31 LAB — VITAMIN B12: VITAMIN B12: 700 pg/mL — ABNORMAL HIGH (ref >40)

## 2020-12-31 LAB — PARATHYROID HORMONE: PTH HORMONE: 93 pg/mL — ABNORMAL HIGH (ref 10–65)

## 2020-12-31 MED ORDER — FAMOTIDINE 20 MG PO TAB
20 mg | ORAL_TABLET | Freq: Two times a day (BID) | ORAL | 0 refills | 90.00000 days | Status: AC
Start: 2020-12-31 — End: ?

## 2020-12-31 MED FILL — MYCOPHENOLATE SODIUM 180 MG PO TBEC: 180 mg | ORAL | 30 days supply | Qty: 120 | Fill #5 | Status: CP

## 2020-12-31 NOTE — Progress Notes
Transplant Nephrology Clinic Progress Note    Date of Service:     10/11/2022[name]    1960-07-16    [id number]    Referring Physician  Dr. Johnella Moloney   [address]   [address] [place], [place] [address]     Dear Dr. Myrlene Broker,     I had the pleasure of seeing our mutual patient.        Michele Benitez is a 60 y.o. 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 She is seen today for a follow up visit in the [place] [facility].       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     HPI/Interval history:    She was last seen in clinic on 11/06/19.  She is here today with her husband.     Since her last visit she has not had any acute illness.     She is asking about covid booster and flu. She is no longer taking her potassium citrate for stone prevention as she thought a provider asked her to discontinued.      Blood pressures at home average 110/80 , denies dizziness or fatigue. This morning her blood pressure at home was 137/80.     She takes her Envarsus at 9 a.m daily.      Allergy:  Allergies   Allergen Reactions   ? Fish Oil RASH   ? Lipitor [Atorvastatin] DIARRHEA       Medications:  ? acetaminophen (TYLENOL) 500 mg tablet Take 500 mg by mouth every 6 hours as needed for Pain. Max of 4,000 mg of acetaminophen in 24 hours.   ? aspirin EC 81 mg tablet Take one tablet by mouth daily.   ? carvediloL (COREG) 25 mg tablet Take one tablet by mouth twice daily.   ? lisinopriL (ZESTRIL) 40 mg tablet Take one tablet by mouth at bedtime daily.   ? metFORMIN-XR (GLUCOPHAGE XR) 500 mg extended release tablet Take two tablets by mouth daily with dinner.   ? multivitamin (MULTIPLE VITAMIN-MINERALS) tablet Take one tablet by mouth daily.   ? mycophenolate sodium (MYFORTIC) 180 mg tablet,delayed release Take two tablets by mouth twice daily.   ? ondansetron (ZOFRAN ODT) 4 mg rapid dissolve tablet Dissolve one tablet by mouth every 8 hours as needed for Nausea or Vomiting. Place on tongue to disolve.   ? pantoprazole DR (PROTONIX) 40 mg tablet Take one tablet by mouth daily.   ? rosuvastatin (CRESTOR) 10 mg tablet Take one tablet by mouth at bedtime daily for 360 days.   ? sodium bicarbonate 650 mg tablet Take 650 mg by mouth twice daily.   ? tacrolimus XR (ENVARSUS XR) 1 mg tablet Take one tablet by mouth daily. Indications: prevent kidney transplant rejection       Past Medical History:  Medical History:   Diagnosis Date   ? Abnormal stress test 12/03/2015   ? Dyslipidemia 07/18/2014   ?  ESRD (end stage renal disease) (HCC) 12/03/2015   ? Hyperlipemia    ? Hyperphosphatemia 07/18/2014   ? Hypertension 07/18/2014   ? Kidney transplanted 2020   ? PTDM (post-transplant diabetes mellitus) [facility])        Surgical History:  Surgical History:   Procedure Laterality Date   ? Left Heart Catheterization With Ventriculogram Left 12/03/2015    Performed by Harley Alto, MD at [facility] CATH LAB   ? Coronary Angiography N/A 12/03/2015    Performed by Harley Alto, MD at [facility] CATH LAB   ? Possible Percutaneous Coronary Intervention N/A 12/03/2015    Performed by Harley Alto, MD at [facility] CATH LAB   ? ALLOTRANSPLANTATION KIDNEY FROM NON LIVING DONOR WITHOUT RECIPIENT NEPHRECTOMY N/A 09/01/2018    Performed by York Cerise, MD at [facility] OR   -    Review of Systems: review of systems negative except as outlined in the HPI     Family History:  Family History   Problem Relation Age of Onset   ? Heart Failure Mother    ? Alcohol abuse Mother    ? Cancer Father         colon   -      Social History:  Social History     Socioeconomic History   ? Marital status: Married   ? Number of children: 2   Tobacco Use   ? Smoking status: Never Smoker   ? Smokeless tobacco: Never Used   Vaping Use   ? Vaping Use: Never used   Substance and Sexual Activity   ? Alcohol use: No   ? Drug use: No    -      Objective:      Vitals:    12/31/20 0822 12/31/20 0824   BP: (!) 166/90 (!) 143/83   BP Source: Arm, Right Upper Arm, Right Upper   Pulse: 78 82   SpO2: 100%    PainSc: Zero    Weight: 84.5 kg (186 lb 3.2 oz)    Height: 167.6 cm (5' 6)      Body mass index is 30.05 kg/m?Marland Kitchen     Physical Exam    General:middle aged woman in no obvious distress   EYE: no icterus, no conjunctival injection, redness, discharge  Throat: no pharyngeal erythema  Neuro: alert, awake and oriented. No focal deficits noted.  Lungs: CTA b/l,   Heart: regular rate and rhythm.   Extremities: no pedal edema  Abdomen: healed scar in RLQ. soft, non tender, non-distended, no renal bruit   MSK: no deformity  Skin: no bruising, rash noted.  Psych: appropriate mood and affect.  Dialysis access: LAVF with palpable bruit and thrill      Labs and Diagnostic Tests:  Urinalysis:  Lab Results   Component Value Date/Time    UCOLOR Yellow 07/05/2020 08:09 AM    TURBID Clear 07/05/2020 08:09 AM    USPGR 1.010 07/05/2020 08:09 AM    UPH 6.0 07/05/2020 08:09 AM    UPROTEIN Negative 07/05/2020 08:09 AM    UAGLU Negative 07/05/2020 08:09 AM    UKET Negative 07/05/2020 08:09 AM    UBILE Negative 07/05/2020 08:09 AM    UBLD Trace (A) 07/05/2020 08:09 AM    UROB 0.2 07/05/2020 08:09 AM       CBC with Diff:  CBC with Diff Latest Ref Rng & Units 10/08/2020 07/05/2020   WBC 4.8 - 10.8 uL 3.9(L) 5.2   RBC 4.20 - 5.40 uL 4.03(L)  3.87(L)   HGB 12.0 - 16.0 g/dL 16.1 09.6   HCT 04.5 - 47.0 % 40.7 38.7   MCV 80.0 - 99.0 101.0(H) 100.0(H)   MCH 27.0 - 31.0 pG 30.7 31.5(H)   MCHC 30.0 - 34.0 % 30.4 31.5   RDW 11.5 - 14.5 % 13.4 13.2   PLT 130 - 400 x10-3/uL 131 147(L)   MPV 7 - 11 FL - -   NEUT 40.0 - 75.0 % 67.2 45.0   ANC /uL 2.6 2.3   LYMA 24 - 44 % - -   ALYM 0.9 - 5.1 x10-3/uL 0.7(L) 2.2   MONA 4 - 12 % - -   AMONO 0.1 - 0.9 /uL 0.5 0.5   EOSA 0 - 5 % - -   AEOS 0.0 - 0.6 x10-3/uL 0.0 0.1   BASA 0 - 2 % - -   ABAS 0.0 - 0.2 x10-3/uL 0.0 0.1       CMP:  CMP Latest Ref Rng & Units 10/08/2020 07/05/2020   NA 136 - 145 mmol/L 140 139   K 3.5 - 5.1 mmol/L 4.5 4.6   CL 98 - 107 mmol/L 112(H) 109(H)   CO2 22 - 29 mmol/L 18.0(L) 21.0(L)   GAP 0 - 14 15(H) 14   BUN 9.8 - 20.1 mg/dL 24.0(H) 30.0(H)   CR 0.57 - 1.11 mg/dL 4.09(W) 1.19(J)   GLUX 70 - 100 MG/DL - -   CA 8.4 - 47.8 mg/dL 9.7 9.9   TP 6.4 - 8.3 g/dL 6.7 6.9   ALB 3.5 - 5.0 g/dL 4.3 4.2   ALKP 40 - 295 U/L 112 130   ALT 0 - 55 U/L 13 15   TBILI 0.2 - 1.2 mg/dL 6.21 3.08   GFR >65 40.4(L) 36.8(L)   GFRAA >60 mL/min - -       CMV DNA Quant PCR   Date Value   03/05/2020 NOT DETECTED   11/06/2019 CMV DNA NOT DETECTED [IU]/mL   04/04/2019 CMV DNA NOT DETECTED [IU]/mL   02/07/2019 <2.30 Log IU/mL   01/24/2019 3.78 (H)     BK Virus Plasma Quant (no units)   Date Value   03/05/2020 No DNA Detected   11/06/2019 BK Virus Not Detected   09/11/2019 BK Virus Detected (A)   07/24/2019 BK Virus Detected (A)   06/13/2019 BK Virus Detected (A)     No results found for: EBVDNAQT    Other Common Labs:  Other Common Labs Latest Ref Rng & Units 10/08/2020 07/05/2020   IRON 50 - 160 MCG/DL - -   TIBC 784 - 696 MCG/DL - -   PSAT 28 - 42 % - -   FER 10 - 200 NG/ML - -   PO4 2.3 - 4.7 4.1 -   MG 1.6 - 2.6 mg/dL 1.6 1.6   VITD 30 - 80 NG/ML - -   UPRO Negative - Negative   UCRR 47 - 110 mg/dl - 29.52(W)   UPCRC <4.1 - 0.21551(H)   URICA 2.6 - 6.0 mg/dL 6.9(H) 6.6(H)   HBA1C 4.0 - 6.0 % - -   Tacrolimus 5.0 - 20.0 mcg/L - -       Imaging:             Allosure  01/18/19     Assessment and Plan:      Michele Benitez is a 60 y.o. 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  ?  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 Baseline Scr 1.4 - 1.6 mg/dL   Scr today within baseline at 1.39 mg/dL   UPCR - 0.1 on 16/10/96   ?  #) HTN:  Home blood pressure readings controlled   Continue Coreg 25 mg BID ,   Continue lisinopril 40 mg at bedtime.    ?  #) ID?  CMV D+/R+  BK viremia detected but un quantifiable 09/11/19   BK not detected on labs on 03/05/20. No further routine testing required    ?  #)  Normocytic Anemia: resolved  Has macrocytosis - we will check Vitamin B12 , folate and TSH     #) Mild thrombocytopenia with platelets  Chronic , platelets b/w 120 -140 range   Currently in normal range at 160k    ?  #) Hyperuricemia:  Resolved   ?  #)CAD: mild; continue on asa, crestor  and Coreg  Did not tolerate lipitor   Continue crestor 10 mg at bedtime.       #)Post transplant diabetes mellitus - controlled   Has consistent fasting glucose >126, Hemoglobin A1C controlled at 6.1( 12/31/20)   Continue metformin to 1000 mg XR daily. Lifestyle modification reiterated.   Continue crestor 10 mg at bedtime as above   ?  #) Nephrolithiasis: found in transplanted kidney on imaging from 11/15/18.  At risk of stone formation due to low urine citrate excretion on previous litholink   Repeat litholink testing to determine stone prevention recommendations.       #) CKD MBD   PTH down to 76.5 04/04/19 repeat pending   Vitamin D insufficiency   Dexa scan shows osteopenia ( 10/31/19) - Osteopenia, continue Vitamin D supplementation (takes a multivitamin)       #) Electrolytes and Acid base   Reviewed and stable   Continue sodium bicarb for metabolic acidosis to slow rate of CKD progression     #) Forgetfulness-  Improved   Will continue Envarsus 1mg  daily       ?#) Hx of CAD   Cardiac cath in 11/2015 - mild plaquing in RCA and LAD   Aspirin, statin therapy as above     #) Hair loss - improved   Ok to use Nioxin Shampoo.     ?   General health maint. Managed by PCP   Skin cancer surveillance: We will refer you to Dermatology for skin eval due to long term immunosuppression (request previous records)   Cancer screening: Mammogram, Pap Smear, colonoscopy per guidelines. Annual flu vaccine will get today  Last Dexa scan  10/31/19 showing osteopenia, repeat Dexa scan    Avoid all live vaccines.  Fully vaccinated for COVID - to get COVID booster vaccines today   Ok to get shingrix vaccination           RTC 3 months   Labs every 3 months          Anselm Lis, MD      CC: [name]  CC: [place], Setareh Rom is appropriate for kidney transplant nursing management of immunosuppression medications and laboratory values. The goal  is to maintain a therapeutic effect and to decrease the chance of medication nephrotoxicity, rejection and infection while maintaining good allograft function.

## 2020-12-31 NOTE — Progress Notes
Following with Nephrology [place]   last seen 11/06/19 in transplant clinic  Last labs 10/08/20 (labs every 3 months)  No labs ween in Care Everywhere  2 years post renal transplant  Current immunosuppression:  Envarsus and Myfortic      ROS:  No new illness/admissions.  Denies headaches, vision changes.      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  Hx of PTDM  Metformin  HTN:  Coreg and lisinopril  01/22/20 Dermatology:  Shave bx right FA.  Benign    PTH 04/04/19 76.5  Vitamin D 04/04/19 24.2  On supplement  HgbA1C 09/11/19 5.8  Lipid 11/06/19 Crestor  BKV 03/05/20 negative  CMV 03/05/20 not detected

## 2021-01-01 ENCOUNTER — Encounter: Admit: 2021-01-01 | Discharge: 2021-01-01 | Payer: MEDICARE

## 2021-01-02 ENCOUNTER — Encounter: Admit: 2021-01-02 | Discharge: 2021-01-02 | Payer: MEDICARE

## 2021-01-03 ENCOUNTER — Encounter: Admit: 2021-01-03 | Discharge: 2021-01-03 | Payer: MEDICARE

## 2021-01-03 NOTE — Telephone Encounter
Reviewed transplant clinic notes from 12/31/20.  In clinic changed protonix to pepcid 20 mg daily. Referral placed to dermatology.  Appointment March.  Litholink ordered.  Labs:  Cr 1.39, Tacrolimus level 5.6.  Vitamin B 12, folate, TSH, PTH and HgbA1C completed.

## 2021-01-04 ENCOUNTER — Encounter: Admit: 2021-01-04 | Discharge: 2021-01-04 | Payer: MEDICARE

## 2021-01-05 ENCOUNTER — Encounter: Admit: 2021-01-05 | Discharge: 2021-01-05 | Payer: MEDICARE

## 2021-01-06 MED FILL — TACROLIMUS 1 MG PO TB24: 1 mg | ORAL | 30 days supply | Qty: 30 | Fill #5 | Status: AC

## 2021-01-16 ENCOUNTER — Encounter: Admit: 2021-01-16 | Discharge: 2021-01-16 | Payer: MEDICARE

## 2021-01-16 DIAGNOSIS — Z5181 Encounter for therapeutic drug level monitoring: Secondary | ICD-10-CM

## 2021-01-16 DIAGNOSIS — N390 Urinary tract infection, site not specified: Secondary | ICD-10-CM

## 2021-01-16 DIAGNOSIS — Z79899 Other long term (current) drug therapy: Secondary | ICD-10-CM

## 2021-01-16 DIAGNOSIS — Z940 Kidney transplant status: Secondary | ICD-10-CM

## 2021-01-23 ENCOUNTER — Encounter: Admit: 2021-01-23 | Discharge: 2021-01-23 | Payer: MEDICARE

## 2021-01-23 MED FILL — ROSUVASTATIN 10 MG PO TAB: 10 mg | ORAL | 90 days supply | Qty: 90 | Fill #2 | Status: AC

## 2021-01-29 ENCOUNTER — Encounter: Admit: 2021-01-29 | Discharge: 2021-01-29 | Payer: MEDICARE

## 2021-02-03 ENCOUNTER — Encounter: Admit: 2021-02-03 | Discharge: 2021-02-03 | Payer: MEDICARE

## 2021-02-03 MED FILL — MYCOPHENOLATE SODIUM 180 MG PO TBEC: 180 mg | ORAL | 30 days supply | Qty: 120 | Fill #6 | Status: AC

## 2021-02-03 MED FILL — TACROLIMUS 1 MG PO TB24: 1 mg | ORAL | 30 days supply | Qty: 30 | Fill #6 | Status: AC

## 2021-02-21 ENCOUNTER — Encounter: Admit: 2021-02-21 | Discharge: 2021-02-21 | Payer: MEDICARE

## 2021-02-21 MED FILL — METFORMIN 500 MG PO TB24: 500 mg | ORAL | 90 days supply | Qty: 180 | Fill #2 | Status: AC

## 2021-02-24 ENCOUNTER — Encounter: Admit: 2021-02-24 | Discharge: 2021-02-24 | Payer: MEDICARE

## 2021-02-24 MED FILL — LISINOPRIL 40 MG PO TAB: 40 mg | ORAL | 90 days supply | Qty: 90 | Fill #2 | Status: AC

## 2021-03-03 ENCOUNTER — Encounter: Admit: 2021-03-03 | Discharge: 2021-03-03 | Payer: MEDICARE

## 2021-03-04 ENCOUNTER — Encounter: Admit: 2021-03-04 | Discharge: 2021-03-04 | Payer: MEDICARE

## 2021-03-05 MED FILL — MYCOPHENOLATE SODIUM 180 MG PO TBEC: 180 mg | ORAL | 30 days supply | Qty: 120 | Fill #7 | Status: AC

## 2021-03-10 ENCOUNTER — Encounter: Admit: 2021-03-10 | Discharge: 2021-03-10 | Payer: MEDICARE

## 2021-03-10 MED FILL — TACROLIMUS 1 MG PO TB24: 1 mg | ORAL | 30 days supply | Qty: 30 | Fill #7 | Status: AC

## 2021-03-27 ENCOUNTER — Encounter: Admit: 2021-03-27 | Discharge: 2021-03-27 | Payer: MEDICARE

## 2021-03-27 DIAGNOSIS — Z940 Kidney transplant status: Secondary | ICD-10-CM

## 2021-03-31 ENCOUNTER — Encounter: Admit: 2021-03-31 | Discharge: 2021-03-31 | Payer: MEDICARE

## 2021-03-31 MED FILL — CARVEDILOL 25 MG PO TAB: 25 mg | ORAL | 90 days supply | Qty: 180 | Fill #3 | Status: CP

## 2021-03-31 MED FILL — MYCOPHENOLATE SODIUM 180 MG PO TBEC: 180 mg | ORAL | 30 days supply | Qty: 120 | Fill #8 | Status: CP

## 2021-03-31 NOTE — Telephone Encounter
Jeneen Rinks called to r/s pt's today missed appt because pt was sick. Please call him back.

## 2021-04-03 ENCOUNTER — Encounter: Admit: 2021-04-03 | Discharge: 2021-04-03 | Payer: MEDICARE

## 2021-04-06 ENCOUNTER — Encounter: Admit: 2021-04-06 | Discharge: 2021-04-06 | Payer: MEDICARE

## 2021-04-06 MED FILL — TACROLIMUS 1 MG PO TB24: 1 mg | ORAL | 30 days supply | Qty: 30 | Fill #8 | Status: AC

## 2021-04-28 ENCOUNTER — Encounter: Admit: 2021-04-28 | Discharge: 2021-04-28 | Payer: MEDICARE

## 2021-04-28 MED FILL — ROSUVASTATIN 10 MG PO TAB: 10 mg | ORAL | 90 days supply | Qty: 90 | Fill #3 | Status: AC

## 2021-05-02 ENCOUNTER — Encounter: Admit: 2021-05-02 | Discharge: 2021-05-02 | Payer: MEDICARE

## 2021-05-02 DIAGNOSIS — Z940 Kidney transplant status: Secondary | ICD-10-CM

## 2021-05-02 DIAGNOSIS — B348 Other viral infections of unspecified site: Secondary | ICD-10-CM

## 2021-05-02 MED ORDER — ROSUVASTATIN 10 MG PO TAB
10 mg | ORAL_TABLET | Freq: Every evening | ORAL | 3 refills | Status: CN
Start: 2021-05-02 — End: ?

## 2021-05-03 ENCOUNTER — Encounter: Admit: 2021-05-03 | Discharge: 2021-05-03 | Payer: MEDICARE

## 2021-05-04 ENCOUNTER — Encounter: Admit: 2021-05-04 | Discharge: 2021-05-04 | Payer: MEDICARE

## 2021-05-04 MED FILL — MYCOPHENOLATE SODIUM 180 MG PO TBEC: 180 mg | ORAL | 30 days supply | Qty: 120 | Fill #9 | Status: AC

## 2021-05-05 ENCOUNTER — Ambulatory Visit: Admit: 2021-05-05 | Discharge: 2021-05-05 | Payer: MEDICARE

## 2021-05-05 ENCOUNTER — Encounter: Admit: 2021-05-05 | Discharge: 2021-05-05 | Payer: MEDICARE

## 2021-05-05 DIAGNOSIS — Z940 Kidney transplant status: Secondary | ICD-10-CM

## 2021-05-05 DIAGNOSIS — Z79899 Other long term (current) drug therapy: Secondary | ICD-10-CM

## 2021-05-05 DIAGNOSIS — Z5181 Encounter for therapeutic drug level monitoring: Secondary | ICD-10-CM

## 2021-05-05 DIAGNOSIS — N186 End stage renal disease: Secondary | ICD-10-CM

## 2021-05-05 DIAGNOSIS — B348 Other viral infections of unspecified site: Secondary | ICD-10-CM

## 2021-05-05 DIAGNOSIS — D849 Immunodeficiency, unspecified: Secondary | ICD-10-CM

## 2021-05-05 DIAGNOSIS — R9439 Abnormal result of other cardiovascular function study: Secondary | ICD-10-CM

## 2021-05-05 DIAGNOSIS — N390 Urinary tract infection, site not specified: Secondary | ICD-10-CM

## 2021-05-05 DIAGNOSIS — E139 Other specified diabetes mellitus without complications: Secondary | ICD-10-CM

## 2021-05-05 DIAGNOSIS — I10 Essential (primary) hypertension: Secondary | ICD-10-CM

## 2021-05-05 DIAGNOSIS — E785 Hyperlipidemia, unspecified: Secondary | ICD-10-CM

## 2021-05-05 LAB — URINALYSIS DIPSTICK REFLEX TO CULTURE
GLUCOSE,UA: NEGATIVE
NITRITE: NEGATIVE
URINE ASCORBIC ACID, UA: NEGATIVE
URINE KETONE: NEGATIVE

## 2021-05-05 LAB — CBC AND DIFF: WBC COUNT: 4.9 K/UL (ref 4.5–11.0)

## 2021-05-05 LAB — PHOSPHORUS: PHOSPHORUS: 3.8 mg/dL — ABNORMAL LOW (ref 2.0–4.5)

## 2021-05-05 LAB — URINALYSIS MICROSCOPIC REFLEX TO CULTURE

## 2021-05-05 LAB — COMPREHENSIVE METABOLIC PANEL
ALBUMIN: 4.7 g/dL (ref 3.5–5.0)
ALK PHOSPHATASE: 95 U/L (ref 25–110)
ALT: 14 U/L (ref 7–56)
BLD UREA NITROGEN: 27 mg/dL — ABNORMAL HIGH (ref 7–25)
CALCIUM: 9.9 mg/dL (ref 8.5–10.6)
GLUCOSE,PANEL: 109 mg/dL — ABNORMAL HIGH (ref 70–100)
SODIUM: 137 MMOL/L — ABNORMAL LOW (ref 137–147)
TOTAL BILIRUBIN: 0.5 mg/dL (ref 0.3–1.2)
TOTAL PROTEIN: 7 g/dL (ref 6.0–8.0)

## 2021-05-05 LAB — URIC ACID: URIC ACID: 7 mg/dL — ABNORMAL HIGH (ref 2.0–7.0)

## 2021-05-05 LAB — PROTEIN/CR RATIO,UR RAN
UR CREATININE, RAN: 32 mg/dL (ref 21–30)
UR TOTAL PROTEIN,RAN: <4 mg/dL (ref 7–40)

## 2021-05-05 LAB — MAGNESIUM: MAGNESIUM: 1.7 mg/dL — ABNORMAL LOW (ref 1.6–2.6)

## 2021-05-05 LAB — ALLOSURE

## 2021-05-06 ENCOUNTER — Encounter: Admit: 2021-05-06 | Discharge: 2021-05-06 | Payer: MEDICARE

## 2021-05-06 MED FILL — TACROLIMUS 1 MG PO TB24: 1 mg | ORAL | 30 days supply | Qty: 30 | Fill #9 | Status: AC

## 2021-05-07 ENCOUNTER — Encounter: Admit: 2021-05-07 | Discharge: 2021-05-07 | Payer: MEDICARE

## 2021-05-07 MED ORDER — GABAPENTIN 300 MG PO CAP
300 mg | ORAL_CAPSULE | Freq: Every evening | ORAL | 3 refills | Status: AC
Start: 2021-05-07 — End: ?
  Filled 2021-05-08: qty 90, 90d supply, fill #1

## 2021-05-07 MED ORDER — FAMOTIDINE 20 MG PO TAB
20 mg | ORAL_TABLET | Freq: Every day | ORAL | 3 refills | 90.00000 days | Status: AC
Start: 2021-05-07 — End: ?
  Filled 2021-05-08: qty 90, 90d supply, fill #1

## 2021-05-07 NOTE — Progress Notes
Office visit note from 05/05/21 reviewed.  Pepcid 20 mg daily started.  Litholink ordered.  Gabapentin 300 mg nightly started for hot flashes.

## 2021-05-07 NOTE — Telephone Encounter
Labs drawn 05/05/21 reviewed by Dr. Verline Lema. Tacro level is supratherapeutic at 10.9 on Envarsus 1 mg daily.  Patient asked to repeat labs in 4 weeks to follow up on level.

## 2021-05-16 ENCOUNTER — Encounter: Admit: 2021-05-16 | Discharge: 2021-05-16 | Payer: MEDICARE

## 2021-05-16 MED FILL — METFORMIN 500 MG PO TB24: 500 mg | ORAL | 90 days supply | Status: CN

## 2021-05-19 ENCOUNTER — Encounter: Admit: 2021-05-19 | Discharge: 2021-05-19 | Payer: MEDICARE

## 2021-05-19 MED FILL — LISINOPRIL 40 MG PO TAB: 40 mg | ORAL | 90 days supply | Qty: 90 | Fill #3 | Status: AC

## 2021-05-20 ENCOUNTER — Encounter: Admit: 2021-05-20 | Discharge: 2021-05-20 | Payer: MEDICARE

## 2021-05-21 ENCOUNTER — Encounter: Admit: 2021-05-21 | Discharge: 2021-05-21 | Payer: MEDICARE

## 2021-05-21 MED FILL — METFORMIN 500 MG PO TB24: 500 mg | ORAL | 90 days supply | Qty: 180 | Fill #3 | Status: AC

## 2021-05-22 ENCOUNTER — Encounter: Admit: 2021-05-22 | Discharge: 2021-05-22 | Payer: MEDICARE

## 2021-06-02 ENCOUNTER — Encounter: Admit: 2021-06-02 | Discharge: 2021-06-02 | Payer: MEDICARE

## 2021-06-02 MED FILL — MYCOPHENOLATE SODIUM 180 MG PO TBEC: 180 mg | ORAL | 30 days supply | Qty: 120 | Fill #10 | Status: AC

## 2021-06-09 ENCOUNTER — Encounter: Admit: 2021-06-09 | Discharge: 2021-06-09 | Payer: MEDICARE

## 2021-06-09 MED FILL — TACROLIMUS 1 MG PO TB24: 1 mg | ORAL | 30 days supply | Qty: 30 | Fill #10 | Status: AC

## 2021-06-16 ENCOUNTER — Encounter: Admit: 2021-06-16 | Discharge: 2021-06-16 | Payer: MEDICARE

## 2021-06-16 DIAGNOSIS — N390 Urinary tract infection, site not specified: Secondary | ICD-10-CM

## 2021-06-16 DIAGNOSIS — Z5181 Encounter for therapeutic drug level monitoring: Secondary | ICD-10-CM

## 2021-06-16 DIAGNOSIS — Z79899 Other long term (current) drug therapy: Secondary | ICD-10-CM

## 2021-06-16 DIAGNOSIS — Z940 Kidney transplant status: Secondary | ICD-10-CM

## 2021-06-16 NOTE — Telephone Encounter
Pt's husband called in regarding Orders needing to be sent to [employer] [facility] well. Please return pt's call.

## 2021-06-17 ENCOUNTER — Encounter: Admit: 2021-06-17 | Discharge: 2021-06-17 | Payer: MEDICARE

## 2021-06-17 ENCOUNTER — Ambulatory Visit: Admit: 2021-06-17 | Discharge: 2021-06-18 | Payer: MEDICARE

## 2021-06-17 DIAGNOSIS — E139 Other specified diabetes mellitus without complications: Secondary | ICD-10-CM

## 2021-06-17 DIAGNOSIS — N390 Urinary tract infection, site not specified: Secondary | ICD-10-CM

## 2021-06-17 DIAGNOSIS — Z940 Kidney transplant status: Secondary | ICD-10-CM

## 2021-06-17 DIAGNOSIS — R9439 Abnormal result of other cardiovascular function study: Secondary | ICD-10-CM

## 2021-06-17 DIAGNOSIS — L578 Other skin changes due to chronic exposure to nonionizing radiation: Secondary | ICD-10-CM

## 2021-06-17 DIAGNOSIS — D1801 Hemangioma of skin and subcutaneous tissue: Secondary | ICD-10-CM

## 2021-06-17 DIAGNOSIS — B353 Tinea pedis: Secondary | ICD-10-CM

## 2021-06-17 DIAGNOSIS — Z79899 Other long term (current) drug therapy: Secondary | ICD-10-CM

## 2021-06-17 DIAGNOSIS — D229 Melanocytic nevi, unspecified: Secondary | ICD-10-CM

## 2021-06-17 DIAGNOSIS — E785 Hyperlipidemia, unspecified: Secondary | ICD-10-CM

## 2021-06-17 DIAGNOSIS — N186 End stage renal disease: Secondary | ICD-10-CM

## 2021-06-17 DIAGNOSIS — Z5181 Encounter for therapeutic drug level monitoring: Secondary | ICD-10-CM

## 2021-06-17 DIAGNOSIS — I10 Essential (primary) hypertension: Secondary | ICD-10-CM

## 2021-06-17 LAB — CBC AND DIFF
ABSOLUTE EOS COUNT: 0 /uL (ref 0.04–0.54)
ABSOLUTE LYMPH COUNT: 2.3 /uL (ref 1.18–3.174)
ABSOLUTE MONO COUNT: 0.5 /uL (ref 0.24–0.86)
ABSOLUTE NEUTROPHIL: 2.3 /uL (ref 1.56–6.13)
BASOPHILS: 0.4 % (ref 0.1–1.2)
EOSINOPHIL %: 1.3 % (ref 0.7–7.0)
HEMATOCRIT: 36 % (ref 34.1–44.9)
HEMOGLOBIN: 11 g/dL — ABNORMAL HIGH (ref 11.2–15.7)
LYMPHOCYTES: 43 % (ref 19.3–53.1)
MCH: 28 pg (ref 25.6–32.2)
MCHC: 31 % — ABNORMAL LOW (ref 32.2–35.5)
MCV: 91 mg/dL (ref 0.2–1.2)
MONOCYTES %: 9.5 % (ref 4.7–12.5)
MPV: 10 fL (ref 9.4–12.4)
NEUTROPHILS %: 44 % (ref 34.0–71.1)
PLATELET COUNT: 141 g/dL — ABNORMAL LOW (ref 182–369)
RBC COUNT: 3.9 x10-6/uL — ABNORMAL LOW (ref >59)
RDW: 12 U/L (ref 40–150)
WBC COUNT: 5.3 uL — ABNORMAL HIGH (ref 3.98–10.04)

## 2021-06-17 LAB — COMPREHENSIVE METABOLIC PANEL
ANION GAP: 11 meq/L
BLD UREA NITROGEN: 26 mg/dL — ABNORMAL HIGH (ref 19.8–20.1)
CHLORIDE: 106 mmol/L
CO2: 22 mmol/L (ref 22–29)
POTASSIUM: 4.4 mmol/L (ref 3.5–5.1)
SODIUM: 139 mmol/L (ref 136–145)

## 2021-06-17 LAB — PHOSPHORUS: PHOSPHORUS: 4.2 mg/dL (ref 2.3–4.7)

## 2021-06-17 LAB — MAGNESIUM: MAGNESIUM: 1.5 mg/dL — ABNORMAL LOW (ref 1.6–2.6)

## 2021-06-17 LAB — URIC ACID: URIC ACID: 7.4 — ABNORMAL HIGH (ref 2.6–6.0)

## 2021-06-17 NOTE — Progress Notes
Date of Service: 06/17/2021    Subjective:             Michele Benitez is a 61 y.o. female.    History of Present Illness  Return patient. LV 01/2020 with Dr. Kathie Dike.     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  # Patient has a history of brown and tan spots distributed over the head, trunk, arms and legs.?   - These have been present for many years.?  - There is no history of blistering sunburns.  - None are itching, bleeding, or painful  - Currently taking Vit D daily     # Dermal nevus, R forearm   - S/p shave biopsy 01/22/20   ?  Personal Hx: No history of skin cancer.   Family History: No family history of skin cancer.   Social History: Housewife      Review of Systems   Constitutional: Negative for appetite change and unexpected weight change.   Gastrointestinal: Negative for diarrhea, nausea and vomiting.         Objective:         ? aspirin EC 81 mg tablet Take one tablet by mouth daily.   ? carvediloL (COREG) 25 mg tablet Take one tablet by mouth twice daily.   ? famotidine (PEPCID) 20 mg tablet Take one tablet by mouth daily.   ? gabapentin (NEURONTIN) 300 mg capsule Take one capsule by mouth at bedtime daily.   ? lisinopriL (ZESTRIL) 40 mg tablet Take one tablet by mouth at bedtime daily.   ? metFORMIN-XR (GLUCOPHAGE XR) 500 mg extended release tablet Take two tablets by mouth daily with dinner.   ? multivitamin (MULTIPLE VITAMIN-MINERALS) tablet Take one tablet by mouth daily.   ? mycophenolate sodium (MYFORTIC) 180 mg tablet,delayed release Take two tablets by mouth twice daily.   ? ondansetron (ZOFRAN ODT) 4 mg rapid dissolve tablet Dissolve one tablet by mouth every 8 hours as needed for Nausea or Vomiting. Place on tongue to disolve.   ? rosuvastatin (CRESTOR) 10 mg tablet Take one tablet by mouth at bedtime daily.   ? sodium bicarbonate 650 mg tablet Take one tablet by mouth twice daily.   ? tacrolimus XR (ENVARSUS XR) 1 mg tablet Take one tablet by mouth daily.     Vitals:    06/17/21 1327   PainSc: Zero   Weight: 85.5 kg (188 lb 6.4 oz)   Height: 167.6 cm (5' 6)     Body mass index is 30.41 kg/m?Marland Kitchen     Physical Exam    Areas Examined (all normal unless noted below):  Head/Face  Neck  Chest  Back  Abdomen  Buttocks   R upper ext  L upper ext  R lower ext  L lower ext  ?  Pertinent findings include:  ?  Extensive scaling on the plantar surfaces of the feet and maceration in the toe web spaces.      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  #Multiple Benign Nevi  - Will cont to monitor  - Counseled on ABCD's of melanoma  - Counseled on photoprotection with at least SPF 30+ sunscreen (handout provided at check-out)  - Recommend OTC Vit. D supplementation daily (600-800 units daily)  - RTC for any new,  changing or symptomatic lesions  - Encouraged to continue to monitor nevi at home  ?  #Cherry Angiomas  - Benign etiology discussed. Reassurance provided. Observe.  ?  #Diffuse Photodamage  - Counseled on photoprotection with at least SPF 30+ sunscreen (handout provided at check-out)  - Recommend OTC Vit. D supplementation daily (600-800 units daily)  ?  #Tinea Pedis  - Discussed diagnosis, course of disease, prognosis, and treatment with patient  - Not bothersome to pt, not wishing to treat at this time.   ?  ?RTC 1 year    Irma Newness, MD  Dermatology Resident

## 2021-06-18 ENCOUNTER — Encounter: Admit: 2021-06-18 | Discharge: 2021-06-18 | Payer: MEDICARE

## 2021-06-18 DIAGNOSIS — Z940 Kidney transplant status: Secondary | ICD-10-CM

## 2021-06-18 DIAGNOSIS — D849 Immunodeficiency, unspecified: Secondary | ICD-10-CM

## 2021-06-18 DIAGNOSIS — Z5181 Encounter for therapeutic drug level monitoring: Secondary | ICD-10-CM

## 2021-06-18 DIAGNOSIS — N390 Urinary tract infection, site not specified: Secondary | ICD-10-CM

## 2021-06-18 DIAGNOSIS — Z79899 Other long term (current) drug therapy: Secondary | ICD-10-CM

## 2021-06-18 LAB — URINALYSIS DIPSTICK REFLEX TO CULTURE
GLUCOSE,UA: NEGATIVE
LEUKOCYTES: NEGATIVE
NITRITE: NEGATIVE
URINE BILE: NEGATIVE
URINE KETONE: NEGATIVE
UROBILINOGEN: 0.2

## 2021-06-18 LAB — PROTEIN/CR RATIO,UR RAN
PROT CREAT RAT/CAL: 0.1
UR CREATININE, RAN: 61
UR TOTAL PROTEIN,RAN: 7.5 — AB

## 2021-06-18 LAB — URINALYSIS MICROSCOPIC REFLEX TO CULTURE

## 2021-06-19 ENCOUNTER — Encounter: Admit: 2021-06-19 | Discharge: 2021-06-19 | Payer: MEDICARE

## 2021-06-19 DIAGNOSIS — Z5181 Encounter for therapeutic drug level monitoring: Secondary | ICD-10-CM

## 2021-06-19 DIAGNOSIS — Z940 Kidney transplant status: Secondary | ICD-10-CM

## 2021-06-19 DIAGNOSIS — Z79899 Other long term (current) drug therapy: Secondary | ICD-10-CM

## 2021-06-19 DIAGNOSIS — N390 Urinary tract infection, site not specified: Secondary | ICD-10-CM

## 2021-06-19 LAB — TACROLIMUS LC-MS/MS: TACROLIMUS LC-MS/MS: 6.5 ug/L (ref 5.0–20.0)

## 2021-06-23 ENCOUNTER — Encounter: Admit: 2021-06-23 | Discharge: 2021-06-23 | Payer: MEDICARE

## 2021-06-23 MED FILL — CARVEDILOL 25 MG PO TAB: 25 mg | ORAL | 90 days supply | Qty: 180 | Fill #4 | Status: AC

## 2021-06-23 MED FILL — MYCOPHENOLATE SODIUM 180 MG PO TBEC: 180 mg | ORAL | 30 days supply | Qty: 120 | Fill #11 | Status: AC

## 2021-07-01 ENCOUNTER — Encounter: Admit: 2021-07-01 | Discharge: 2021-07-01 | Payer: MEDICARE

## 2021-07-02 ENCOUNTER — Encounter: Admit: 2021-07-02 | Discharge: 2021-07-02 | Payer: MEDICARE

## 2021-07-06 ENCOUNTER — Encounter: Admit: 2021-07-06 | Discharge: 2021-07-06 | Payer: MEDICARE

## 2021-07-06 MED FILL — TACROLIMUS 1 MG PO TB24: 1 mg | ORAL | 30 days supply | Qty: 30 | Fill #11 | Status: AC

## 2021-07-07 ENCOUNTER — Encounter: Admit: 2021-07-07 | Discharge: 2021-07-07 | Payer: MEDICARE

## 2021-07-28 ENCOUNTER — Encounter: Admit: 2021-07-28 | Discharge: 2021-07-28 | Payer: MEDICARE

## 2021-07-28 DIAGNOSIS — D849 Immunodeficiency, unspecified: Secondary | ICD-10-CM

## 2021-07-28 DIAGNOSIS — Z940 Kidney transplant status: Secondary | ICD-10-CM

## 2021-07-28 MED ORDER — LISINOPRIL 40 MG PO TAB
40 mg | ORAL_TABLET | Freq: Every evening | ORAL | 3 refills | Status: AC
Start: 2021-07-28 — End: ?
  Filled 2021-07-29: qty 90, 90d supply, fill #1

## 2021-08-01 ENCOUNTER — Encounter: Admit: 2021-08-01 | Discharge: 2021-08-01 | Payer: MEDICARE

## 2021-08-01 MED FILL — ROSUVASTATIN 10 MG PO TAB: 10 mg | ORAL | 90 days supply | Qty: 90 | Fill #1 | Status: AC

## 2021-08-04 ENCOUNTER — Encounter: Admit: 2021-08-04 | Discharge: 2021-08-04 | Payer: MEDICARE

## 2021-08-04 MED FILL — MYCOPHENOLATE SODIUM 180 MG PO TBEC: 180 mg | ORAL | 30 days supply | Qty: 120 | Fill #12 | Status: CP

## 2021-08-04 MED FILL — TACROLIMUS 1 MG PO TB24: 1 mg | ORAL | 30 days supply | Qty: 30 | Fill #12 | Status: CP

## 2021-08-12 ENCOUNTER — Encounter: Admit: 2021-08-12 | Discharge: 2021-08-12 | Payer: MEDICARE

## 2021-08-12 MED FILL — METFORMIN 500 MG PO TB24: 500 mg | ORAL | 90 days supply | Qty: 180 | Fill #4 | Status: AC

## 2021-09-01 ENCOUNTER — Encounter: Admit: 2021-09-01 | Discharge: 2021-09-01 | Payer: MEDICARE

## 2021-09-01 MED ORDER — MYCOPHENOLATE SODIUM 180 MG PO TBEC
360 mg | ORAL_TABLET | Freq: Two times a day (BID) | ORAL | 11 refills | Status: AC
Start: 2021-09-01 — End: ?
  Filled 2021-09-01: qty 120, 30d supply, fill #1

## 2021-09-04 ENCOUNTER — Encounter: Admit: 2021-09-04 | Discharge: 2021-09-04 | Payer: MEDICARE

## 2021-09-04 MED FILL — MYCOPHENOLATE SODIUM 180 MG PO TBEC: 180 mg | ORAL | 30 days supply | Qty: 120 | Fill #1 | Status: AC

## 2021-09-10 ENCOUNTER — Encounter: Admit: 2021-09-10 | Discharge: 2021-09-10 | Payer: MEDICARE

## 2021-09-10 DIAGNOSIS — Z940 Kidney transplant status: Secondary | ICD-10-CM

## 2021-09-10 DIAGNOSIS — D849 Immunodeficiency, unspecified: Secondary | ICD-10-CM

## 2021-09-10 MED ORDER — TACROLIMUS 1 MG PO TB24
1 mg | ORAL_TABLET | Freq: Every day | ORAL | 11 refills | 30.00000 days | Status: AC
Start: 2021-09-10 — End: ?
  Filled 2021-09-10: qty 30, 30d supply, fill #1

## 2021-09-16 ENCOUNTER — Encounter: Admit: 2021-09-16 | Discharge: 2021-09-16 | Payer: MEDICARE

## 2021-09-22 ENCOUNTER — Encounter: Admit: 2021-09-22 | Discharge: 2021-09-22 | Payer: MEDICARE

## 2021-09-22 MED ORDER — CARVEDILOL 25 MG PO TAB
25 mg | ORAL_TABLET | Freq: Two times a day (BID) | ORAL | 3 refills | Status: CN
Start: 2021-09-22 — End: ?

## 2021-09-24 ENCOUNTER — Encounter: Admit: 2021-09-24 | Discharge: 2021-09-24 | Payer: MEDICARE

## 2021-09-24 MED FILL — CARVEDILOL 25 MG PO TAB: 25 mg | ORAL | 30 days supply | Status: CN

## 2021-09-24 MED FILL — CARVEDILOL 25 MG PO TAB: 25 mg | ORAL | 90 days supply | Qty: 180 | Fill #1 | Status: CP

## 2021-09-30 ENCOUNTER — Encounter: Admit: 2021-09-30 | Discharge: 2021-09-30 | Payer: MEDICARE

## 2021-10-02 ENCOUNTER — Encounter: Admit: 2021-10-02 | Discharge: 2021-10-02 | Payer: MEDICARE

## 2021-10-02 NOTE — Telephone Encounter
Michele Benitez called on behalf of the pt stating that she got new insurance and it isn't covering all her medications. Please call back.

## 2021-10-02 NOTE — Telephone Encounter
Spoke with pt and husband Michele Benitez.  Medicare and Medicaid were cancelled in June.  They got [place] ID:  [id number].  They said the Envarsus is not covered.  Routed note to SW to explore options.

## 2021-10-03 ENCOUNTER — Encounter: Admit: 2021-10-03 | Discharge: 2021-10-03 | Payer: MEDICARE

## 2021-10-03 MED FILL — TACROLIMUS 1 MG PO TB24: 1 mg | ORAL | 30 days supply | Qty: 30 | Fill #2 | Status: AC

## 2021-10-03 MED FILL — MYCOPHENOLATE SODIUM 180 MG PO TBEC: 180 mg | ORAL | 30 days supply | Qty: 120 | Fill #2 | Status: CP

## 2021-10-03 NOTE — Telephone Encounter
SW notified by RN that patient has had change in insurance- from Medicare/Medicaid to [employer]- by Therapist, sports and needs help with coverage of Envarsus.  Per R. Cole in the pharmacy, [place] not coming up in the system for patient.  SW called and spoke with patient, she said that the [place] does not start until 8/1.  SW discussed with patient that SW will plan to use the manufacturer 30-day free trial card for today, she would like this shipped and SW confirmed her address.  She does not yet have her [employer] card, SW provided her with this SW name/number and requested that she call this SW once she has the insurance card with prescription policy details.  SW also set reminder to check back in with patient in a couple weeks to obtain the policy details.  SW updated Orland Mustard, RN.    Lynetta Mare, LMSW

## 2021-10-20 ENCOUNTER — Encounter: Admit: 2021-10-20 | Discharge: 2021-10-20 | Payer: MEDICARE

## 2021-10-20 NOTE — Telephone Encounter
SW called patient to obtain her Ambetter policy details to determine coverage of Envarsus with the pharmacy.  Patient unsure where the card is right now and does not know how to take a picture to send to SW electronically.  She requested that SW send her a self-addressed envelope for her to have copies made (at [employer]) to then return to this SW.  SW to check with Jari Pigg in the pharmacy if she is able to see the policy details tomorrow on 8/1, if not will send envelope to patient as requested.    Lynetta Mare, LMSW

## 2021-10-21 ENCOUNTER — Encounter: Admit: 2021-10-21 | Discharge: 2021-10-21 | Payer: MEDICARE

## 2021-10-21 NOTE — Progress Notes
Per R. Cole in the pharmacy, she was able to locate/run the [employer]- the Myfortic went through for $0 and the Envarsus requires a PA- which she sent to patient's RN.  SW to f/u.    Lynetta Mare, LMSW

## 2021-10-21 NOTE — Progress Notes
The prior authorization for Envarsus has been submitted for Michele Benitez via Cover My Meds.  Will continue to follow.    [place] Patient Advocate  [phone]

## 2021-10-22 ENCOUNTER — Encounter: Admit: 2021-10-22 | Discharge: 2021-10-22 | Payer: MEDICARE

## 2021-10-22 MED FILL — MYCOPHENOLATE SODIUM 180 MG PO TBEC: 180 mg | ORAL | 30 days supply | Qty: 120 | Fill #3 | Status: AC

## 2021-10-22 MED FILL — TACROLIMUS 1 MG PO TB24: 1 mg | ORAL | 30 days supply | Qty: 30 | Fill #3 | Status: AC

## 2021-10-22 NOTE — Progress Notes
The Prior Authorization for Envarsus was approved for Michele Benitez from 10/21/2021 to 10/21/2022.  The copay is $0.00.  The PA authorization number is [id number].    [place] Patient Advocate  [phone]

## 2021-10-23 ENCOUNTER — Encounter: Admit: 2021-10-23 | Discharge: 2021-10-23 | Payer: MEDICARE

## 2021-10-24 ENCOUNTER — Encounter: Admit: 2021-10-24 | Discharge: 2021-10-24 | Payer: MEDICARE

## 2021-10-24 DIAGNOSIS — Z940 Kidney transplant status: Secondary | ICD-10-CM

## 2021-10-24 DIAGNOSIS — D849 Immunodeficiency, unspecified: Secondary | ICD-10-CM

## 2021-10-24 NOTE — Telephone Encounter
SW noted per EMR that Envarsus has also been approved through insurance and was shipped 8/2, there was a $0 co-pay after the pharmacy applied the manufacturer co-pay card.  SW called to update patient, she stated that she received the medications yesterday.  She stated that she has been notified that she will remain eligible for [employer] and so she will likely plan to cancel the Ambetter coverage.  She will update this SW should she have any future medication access issues, SW sent teams message to R. Landry Mellow in the pharmacy as Juluis Rainier re: patients insurance.    Michele Benitez, LMSW

## 2021-10-27 ENCOUNTER — Encounter: Admit: 2021-10-27 | Discharge: 2021-10-27 | Payer: MEDICARE

## 2021-10-27 NOTE — Telephone Encounter
Spoke with husband and pt.   Insurance does not Risk manager.  He is going to send list of meds covered.    Insurance may not cover sodium bicarbonate.  When ready to refill will check price.    Insurance does not cover myfortic but will cover CellCept

## 2021-10-28 ENCOUNTER — Encounter: Admit: 2021-10-28 | Discharge: 2021-10-28 | Payer: MEDICARE

## 2021-10-28 MED FILL — ROSUVASTATIN 10 MG PO TAB: 10 mg | ORAL | 90 days supply | Qty: 90 | Fill #2 | Status: AC

## 2021-10-29 ENCOUNTER — Encounter: Admit: 2021-10-29 | Discharge: 2021-10-29 | Payer: MEDICARE

## 2021-10-29 DIAGNOSIS — Z940 Kidney transplant status: Secondary | ICD-10-CM

## 2021-10-29 DIAGNOSIS — D849 Immunodeficiency, unspecified: Secondary | ICD-10-CM

## 2021-10-29 MED ORDER — METFORMIN 500 MG PO TB24
1000 mg | ORAL_TABLET | Freq: Every day | ORAL | 3 refills | Status: AC
Start: 2021-10-29 — End: ?
  Filled 2021-10-30: qty 180, 90d supply, fill #1

## 2021-11-07 ENCOUNTER — Encounter: Admit: 2021-11-07 | Discharge: 2021-11-07 | Payer: MEDICARE

## 2021-11-11 ENCOUNTER — Encounter: Admit: 2021-11-11 | Discharge: 2021-11-11 | Payer: MEDICARE

## 2021-11-11 MED FILL — LISINOPRIL 40 MG PO TAB: 40 mg | ORAL | 90 days supply | Qty: 90 | Fill #2 | Status: AC

## 2021-11-12 ENCOUNTER — Encounter: Admit: 2021-11-12 | Discharge: 2021-11-12 | Payer: MEDICARE

## 2021-11-12 DIAGNOSIS — D849 Immunodeficiency, unspecified: Secondary | ICD-10-CM

## 2021-11-12 DIAGNOSIS — Z940 Kidney transplant status: Secondary | ICD-10-CM

## 2021-11-12 LAB — TACROLIMUS LC-MS/MS: TACROLIMUS LC-MS/MS: 5.3 ug/L (ref 5.0–20.0)

## 2021-11-14 ENCOUNTER — Encounter: Admit: 2021-11-14 | Discharge: 2021-11-14 | Payer: MEDICARE

## 2021-11-25 ENCOUNTER — Encounter: Admit: 2021-11-25 | Discharge: 2021-11-25 | Payer: MEDICARE

## 2021-11-26 MED FILL — TACROLIMUS 1 MG PO TB24: 1 mg | ORAL | 30 days supply | Qty: 30 | Fill #4 | Status: AC

## 2021-11-26 MED FILL — MYCOPHENOLATE SODIUM 180 MG PO TBEC: 180 mg | ORAL | 30 days supply | Qty: 120 | Fill #4 | Status: AC

## 2021-12-09 ENCOUNTER — Encounter: Admit: 2021-12-09 | Discharge: 2021-12-09 | Payer: MEDICARE

## 2021-12-16 ENCOUNTER — Encounter: Admit: 2021-12-16 | Discharge: 2021-12-16 | Payer: MEDICARE

## 2021-12-18 ENCOUNTER — Encounter: Admit: 2021-12-18 | Discharge: 2021-12-18 | Payer: MEDICARE

## 2021-12-19 ENCOUNTER — Encounter: Admit: 2021-12-19 | Discharge: 2021-12-19 | Payer: MEDICARE

## 2021-12-19 MED FILL — MYCOPHENOLATE SODIUM 180 MG PO TBEC: 180 mg | ORAL | 30 days supply | Qty: 120 | Fill #5 | Status: AC

## 2021-12-19 MED FILL — CARVEDILOL 25 MG PO TAB: 25 mg | ORAL | 90 days supply | Qty: 180 | Fill #2 | Status: AC

## 2021-12-19 MED FILL — TACROLIMUS 1 MG PO TB24: 1 mg | ORAL | 30 days supply | Qty: 30 | Fill #5 | Status: AC

## 2022-01-08 ENCOUNTER — Encounter: Admit: 2022-01-08 | Discharge: 2022-01-08 | Payer: MEDICARE

## 2022-01-12 ENCOUNTER — Encounter: Admit: 2022-01-12 | Discharge: 2022-01-12 | Payer: MEDICARE

## 2022-01-12 NOTE — Progress Notes
Contacted [facility] to refill their medication(s) MYCOPHENOLATE SODIUM 180 MG PO TBEC, ENVARSUS XR 1 MG PO TB24.    Patient declined a refill because they have enough medication on hand. They estimated to have over 2 weeks worth  days available. The patient requested a call back on 01/20/2022.    [place]  [phone]

## 2022-01-13 ENCOUNTER — Encounter: Admit: 2022-01-13 | Discharge: 2022-01-13 | Payer: MEDICARE

## 2022-01-13 MED FILL — METFORMIN 500 MG PO TB24: 500 mg | ORAL | 90 days supply | Qty: 180 | Fill #2 | Status: AC

## 2022-01-13 MED FILL — ROSUVASTATIN 10 MG PO TAB: 10 mg | ORAL | 90 days supply | Qty: 90 | Fill #3 | Status: AC

## 2022-02-02 ENCOUNTER — Encounter: Admit: 2022-02-02 | Discharge: 2022-02-02 | Payer: MEDICARE

## 2022-02-03 MED FILL — MYCOPHENOLATE SODIUM 180 MG PO TBEC: 180 mg | ORAL | 30 days supply | Qty: 120 | Fill #6 | Status: AC

## 2022-02-03 MED FILL — TACROLIMUS 1 MG PO TB24: 1 mg | ORAL | 30 days supply | Qty: 30 | Fill #6 | Status: AC

## 2022-02-19 ENCOUNTER — Encounter: Admit: 2022-02-19 | Discharge: 2022-02-19 | Payer: MEDICARE

## 2022-02-19 MED FILL — LISINOPRIL 40 MG PO TAB: 40 mg | ORAL | 90 days supply | Qty: 90 | Fill #3 | Status: AC

## 2022-02-22 ENCOUNTER — Encounter: Admit: 2022-02-22 | Discharge: 2022-02-22 | Payer: MEDICARE

## 2022-02-24 ENCOUNTER — Encounter: Admit: 2022-02-24 | Discharge: 2022-02-24 | Payer: MEDICARE

## 2022-02-27 ENCOUNTER — Encounter: Admit: 2022-02-27 | Discharge: 2022-02-27 | Payer: MEDICARE

## 2022-02-27 MED FILL — TACROLIMUS 1 MG PO TB24: 1 mg | ORAL | 30 days supply | Qty: 30 | Fill #7 | Status: AC

## 2022-02-27 MED FILL — MYCOPHENOLATE SODIUM 180 MG PO TBEC: 180 mg | ORAL | 30 days supply | Qty: 120 | Fill #7 | Status: AC

## 2022-03-11 ENCOUNTER — Encounter: Admit: 2022-03-11 | Discharge: 2022-03-11 | Payer: MEDICARE

## 2022-03-11 DIAGNOSIS — Z940 Kidney transplant status: Secondary | ICD-10-CM

## 2022-03-11 DIAGNOSIS — D849 Immunodeficiency, unspecified: Secondary | ICD-10-CM

## 2022-03-11 LAB — URINALYSIS DIPSTICK REFLEX TO CULTURE
GLUCOSE,UA: NEGATIVE
LEUKOCYTES: NEGATIVE
NITRITE: NEGATIVE
PROTEIN,UA: NEGATIVE
URINE BILE: NEGATIVE
URINE KETONE: NEGATIVE
URINE PH: 5.5
URINE SPEC GRAVITY: <=1
UROBILINOGEN: 0.2

## 2022-03-11 LAB — CBC AND DIFF
ABSOLUTE BASO COUNT: 0
ABSOLUTE EOS COUNT: 0
ABSOLUTE LYMPH COUNT: 2.3
ABSOLUTE MONO COUNT: 0.4
ABSOLUTE NEUTROPHIL: 2.6
BASOPHILS: 0.5
EOSINOPHIL %: 1.4
LYMPHOCYTES: 41
MONOCYTES %: 8.8
MPV: 9.7 mg/dL
NEUTROPHILS %: 47
PLATELET COUNT: 147 — ABNORMAL LOW (ref 182–369)
RDW: 13 — ABNORMAL HIGH (ref 70–105)
WBC COUNT: 5.5

## 2022-03-11 LAB — URINALYSIS MICROSCOPIC REFLEX TO CULTURE

## 2022-03-11 LAB — URIC ACID: URIC ACID: 7.2 — ABNORMAL HIGH (ref 2.6–6.0)

## 2022-03-11 LAB — PHOSPHORUS: PHOSPHORUS: 3.6

## 2022-03-11 LAB — COMPREHENSIVE METABOLIC PANEL: SODIUM: 139 mmol/L

## 2022-03-11 LAB — MAGNESIUM: MAGNESIUM: 1.5 — ABNORMAL LOW (ref 1.6–2.6)

## 2022-03-11 LAB — PROTEIN/CR RATIO,UR RAN: UR CREATININE, RAN: 25 — ABNORMAL LOW (ref 47–110)

## 2022-03-13 ENCOUNTER — Encounter: Admit: 2022-03-13 | Discharge: 2022-03-13 | Payer: MEDICARE

## 2022-03-13 DIAGNOSIS — Z940 Kidney transplant status: Secondary | ICD-10-CM

## 2022-03-13 DIAGNOSIS — D849 Immunodeficiency, unspecified: Secondary | ICD-10-CM

## 2022-03-13 LAB — TACROLIMUS LC-MS/MS: TACROLIMUS LC-MS/MS: 6.8

## 2022-03-17 ENCOUNTER — Encounter: Admit: 2022-03-17 | Discharge: 2022-03-17 | Payer: MEDICARE

## 2022-03-17 MED FILL — CARVEDILOL 25 MG PO TAB: 25 mg | ORAL | 90 days supply | Qty: 180 | Fill #3 | Status: AC

## 2022-03-20 ENCOUNTER — Encounter: Admit: 2022-03-20 | Discharge: 2022-03-20 | Payer: MEDICARE

## 2022-03-24 ENCOUNTER — Encounter: Admit: 2022-03-24 | Discharge: 2022-03-24 | Payer: MEDICARE

## 2022-03-27 ENCOUNTER — Encounter: Admit: 2022-03-27 | Discharge: 2022-03-27 | Payer: MEDICARE

## 2022-03-27 MED FILL — MYCOPHENOLATE SODIUM 180 MG PO TBEC: 180 mg | ORAL | 30 days supply | Qty: 120 | Fill #8 | Status: AC

## 2022-03-27 MED FILL — TACROLIMUS 1 MG PO TB24: 1 mg | ORAL | 30 days supply | Qty: 30 | Fill #8 | Status: AC

## 2022-04-01 ENCOUNTER — Encounter: Admit: 2022-04-01 | Discharge: 2022-04-01 | Payer: MEDICARE

## 2022-04-01 MED FILL — METFORMIN 500 MG PO TB24: 500 mg | ORAL | 90 days supply | Qty: 180 | Fill #3 | Status: AC

## 2022-04-13 ENCOUNTER — Encounter: Admit: 2022-04-13 | Discharge: 2022-04-13 | Payer: MEDICARE

## 2022-04-14 MED FILL — ROSUVASTATIN 10 MG PO TAB: 10 mg | ORAL | 90 days supply | Qty: 90 | Fill #4 | Status: AC

## 2022-04-15 ENCOUNTER — Encounter: Admit: 2022-04-15 | Discharge: 2022-04-15 | Payer: MEDICARE

## 2022-04-20 ENCOUNTER — Encounter: Admit: 2022-04-20 | Discharge: 2022-04-20 | Payer: MEDICARE

## 2022-04-21 ENCOUNTER — Encounter: Admit: 2022-04-21 | Discharge: 2022-04-21 | Payer: MEDICARE

## 2022-04-22 ENCOUNTER — Encounter: Admit: 2022-04-22 | Discharge: 2022-04-22 | Payer: MEDICARE

## 2022-04-22 MED ORDER — ROSUVASTATIN 10 MG PO TAB
10 mg | ORAL_TABLET | Freq: Every evening | ORAL | 3 refills
Start: 2022-04-22 — End: ?

## 2022-04-22 NOTE — Progress Notes
Contacted [facility] to refill their medication(s) MYCOPHENOLATE SODIUM 180 MG PO TBEC, ENVARSUS XR 1 MG PO TB24.    [place]  [phone]

## 2022-04-23 ENCOUNTER — Encounter: Admit: 2022-04-23 | Discharge: 2022-04-23 | Payer: MEDICARE

## 2022-04-28 ENCOUNTER — Encounter: Admit: 2022-04-28 | Discharge: 2022-04-28 | Payer: MEDICARE

## 2022-04-29 ENCOUNTER — Encounter: Admit: 2022-04-29 | Discharge: 2022-04-29 | Payer: MEDICARE

## 2022-04-29 MED FILL — TACROLIMUS 1 MG PO TB24: 1 mg | ORAL | 30 days supply | Qty: 30 | Fill #9 | Status: AC

## 2022-04-29 MED FILL — MYCOPHENOLATE SODIUM 180 MG PO TBEC: 180 mg | ORAL | 30 days supply | Qty: 120 | Fill #9 | Status: AC

## 2022-05-12 ENCOUNTER — Encounter: Admit: 2022-05-12 | Discharge: 2022-05-12 | Payer: MEDICARE

## 2022-05-12 MED FILL — LISINOPRIL 40 MG PO TAB: 40 mg | ORAL | 90 days supply | Qty: 90 | Fill #4 | Status: AC

## 2022-05-18 ENCOUNTER — Encounter: Admit: 2022-05-18 | Discharge: 2022-05-18 | Payer: MEDICARE

## 2022-05-18 DIAGNOSIS — Z79899 Other long term (current) drug therapy: Secondary | ICD-10-CM

## 2022-05-18 DIAGNOSIS — Z940 Kidney transplant status: Secondary | ICD-10-CM

## 2022-05-18 DIAGNOSIS — D849 Immunodeficiency, unspecified: Secondary | ICD-10-CM

## 2022-05-18 DIAGNOSIS — Z5181 Encounter for therapeutic drug level monitoring: Secondary | ICD-10-CM

## 2022-05-18 DIAGNOSIS — B348 Other viral infections of unspecified site: Secondary | ICD-10-CM

## 2022-05-19 ENCOUNTER — Ambulatory Visit: Admit: 2022-05-19 | Discharge: 2022-05-19 | Payer: MEDICARE

## 2022-05-19 ENCOUNTER — Encounter: Admit: 2022-05-19 | Discharge: 2022-05-19 | Payer: MEDICARE

## 2022-05-19 DIAGNOSIS — Z79899 Other long term (current) drug therapy: Secondary | ICD-10-CM

## 2022-05-19 DIAGNOSIS — B348 Other viral infections of unspecified site: Secondary | ICD-10-CM

## 2022-05-19 DIAGNOSIS — Z5181 Encounter for therapeutic drug level monitoring: Secondary | ICD-10-CM

## 2022-05-19 DIAGNOSIS — E139 Other specified diabetes mellitus without complications: Secondary | ICD-10-CM

## 2022-05-19 DIAGNOSIS — Z940 Kidney transplant status: Secondary | ICD-10-CM

## 2022-05-19 DIAGNOSIS — D849 Immunodeficiency, unspecified: Secondary | ICD-10-CM

## 2022-05-19 DIAGNOSIS — E785 Hyperlipidemia, unspecified: Secondary | ICD-10-CM

## 2022-05-19 DIAGNOSIS — N186 End stage renal disease: Secondary | ICD-10-CM

## 2022-05-19 DIAGNOSIS — I10 Essential (primary) hypertension: Secondary | ICD-10-CM

## 2022-05-19 DIAGNOSIS — R9439 Abnormal result of other cardiovascular function study: Secondary | ICD-10-CM

## 2022-05-19 LAB — URINALYSIS MICROSCOPIC REFLEX TO CULTURE

## 2022-05-19 LAB — COMPREHENSIVE METABOLIC PANEL
ALBUMIN: 4.7 g/dL (ref 3.5–5.0)
CREATININE: 1.3 mg/dL — ABNORMAL HIGH (ref 0.4–1.00)
GLUCOSE,PANEL: 112 mg/dL — ABNORMAL HIGH (ref 70–100)
POTASSIUM: 4.8 MMOL/L (ref 3.5–5.1)
SODIUM: 138 MMOL/L — ABNORMAL LOW (ref 137–147)

## 2022-05-19 LAB — URINALYSIS DIPSTICK REFLEX TO CULTURE
NITRITE: NEGATIVE
URINE ASCORBIC ACID, UA: NEGATIVE
URINE BLOOD: NEGATIVE

## 2022-05-19 LAB — CBC AND DIFF
RBC COUNT: 4.1 M/UL (ref 4.0–5.0)
WBC COUNT: 6.2 K/UL (ref 4.5–11.0)

## 2022-05-19 LAB — PARATHYROID HORMONE: PTH HORMONE: 72 pg/mL — ABNORMAL HIGH (ref 70–105)

## 2022-05-19 LAB — 25-OH VITAMIN D (D2 + D3): VITAMIN D (25-OH) TOTAL: 39 ng/mL — ABNORMAL LOW (ref >=60)

## 2022-05-19 LAB — PROTEIN/CR RATIO,UR RAN
UR CREATININE, RAN: 106 mg/dL (ref 6.0–8.0)
UR TOTAL PROTEIN,RAN: 9 mg/dL (ref 8.5–10.6)

## 2022-05-19 LAB — MAGNESIUM: MAGNESIUM: 1.5 mg/dL — ABNORMAL LOW (ref 1.6–2.6)

## 2022-05-19 LAB — CMV QUANT PCR-BLOOD

## 2022-05-19 LAB — URIC ACID: URIC ACID: 7.6 mg/dL — ABNORMAL HIGH (ref <0.15)

## 2022-05-19 LAB — FK506 TACROLIMUS: TACROLIMUS: 12 ng/mL (ref 5.0–15.0)

## 2022-05-19 LAB — PHOSPHORUS: PHOSPHORUS: 3.8 mg/dL (ref 2.0–4.5)

## 2022-05-19 LAB — HEMOGLOBIN A1C: HEMOGLOBIN A1C: 6.3 % — ABNORMAL HIGH (ref 4.0–5.7)

## 2022-05-19 NOTE — Progress Notes
Transplant Nephrology Clinic Progress Note    Date of Service:     2/27/2024[name]    09/05/1960    [id number]    Referring Physician  Dr. Johnella Moloney   [address]   [address] [place], [place] [address]     Dear Dr. Myrlene Broker,     I had the pleasure of seeing our mutual patient.        Michele Benitez is a 62 y.o. 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 She is seen today for a follow up visit in the [place] [facility].       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     HPI/Interval history:    She was last seen in clinic on 04/2021  No changes on her health since then  Follows w pcp, local neph  No new surgeries, meds.  No hospitalzations  Weight and energy stable  Feeling well overall  Bp at home 120s   Saw derm last year.      Allergy:  Allergies   Allergen Reactions    Fish Oil RASH    Lipitor [Atorvastatin] DIARRHEA       Medications:   aspirin EC 81 mg tablet Take one tablet by mouth daily.    carvediloL (COREG) 25 mg tablet Take one tablet by mouth twice daily.    famotidine (PEPCID) 20 mg tablet Take one tablet by mouth daily.    gabapentin (NEURONTIN) 300 mg capsule Take one capsule by mouth at bedtime daily.    lisinopriL (ZESTRIL) 40 mg tablet Take one tablet by mouth at bedtime daily.    metFORMIN-XR (GLUCOPHAGE XR) 500 mg extended release tablet Take two tablets by mouth daily with dinner.    multivitamin (MULTIPLE VITAMIN-MINERALS) tablet Take one tablet by mouth daily.    mycophenolate sodium (MYFORTIC) 180 mg tablet,delayed release Take two tablets by mouth twice daily.    ondansetron (ZOFRAN ODT) 4 mg rapid dissolve tablet Dissolve one tablet by mouth every 8 hours as needed for Nausea or Vomiting. Place on tongue to disolve.    rosuvastatin (CRESTOR) 10 mg tablet Take one tablet by mouth at bedtime daily.    sodium bicarbonate 650 mg tablet Take one tablet by mouth twice daily.    tacrolimus XR (ENVARSUS XR) 1 mg tablet Take one tablet by mouth daily.       Past Medical History:  Medical History:   Diagnosis Date    Abnormal stress test 12/03/2015    Dyslipidemia 07/18/2014    ESRD (end stage renal disease) (HCC) 12/03/2015    Hyperlipemia     Hyperphosphatemia 07/18/2014    Hypertension 07/18/2014    Kidney transplanted 2020    PTDM (post-transplant diabetes mellitus) [facility])        Surgical History:  Surgical History:   Procedure Laterality Date    Left Heart Catheterization With Ventriculogram Left  12/03/2015    Performed by Harley Alto, MD at [facility] CATH LAB    Coronary Angiography N/A 12/03/2015    Performed by Harley Alto, MD at [facility] CATH LAB    Possible Percutaneous Coronary Intervention N/A 12/03/2015    Performed by Harley Alto, MD at [facility] CATH LAB    ALLOTRANSPLANTATION KIDNEY FROM NON LIVING DONOR WITHOUT RECIPIENT NEPHRECTOMY N/A 09/01/2018    Performed by York Cerise, MD at [place] OR   -    Review of Systems: review of systems negative except as outlined in the HPI     Family History:  Family History   Problem Relation Age of Onset    Heart Failure Mother     Alcohol abuse Mother     Cancer Father         colon   -      Social History:  Social History     Socioeconomic History    Marital status: Married    Number of children: 2   Tobacco Use    Smoking status: Never    Smokeless tobacco: Never   Vaping Use    Vaping status: Never Used   Substance and Sexual Activity    Alcohol use: No    Drug use: No    -      Objective:      Vitals:    05/19/22 1049 05/19/22 1057 BP: 136/72 114/65   BP Source: Arm, Right Upper Arm, Right Upper   Pulse: 75 81   Temp: 36.8 ?C (98.2 ?F)    SpO2: 97%    TempSrc: Oral    PainSc: Zero    Weight: 88.8 kg (195 lb 12.8 oz)    Height: 167.6 cm (5' 6)      Body mass index is 31.6 kg/m?Marland Kitchen     Physical Exam    General:middle aged woman in no obvious distress   EYE: no icterus, no conjunctival injection, redness, discharge  Neuro: alert, awake and oriented. No focal deficits noted.  Lungs: CTA b/l,   Heart: regular rate and rhythm.   Extremities: no pedal edema  Abdomen: healed scar in RLQ. soft, non tender, non-distended, no renal bruit   MSK: no deformity  Skin: no bruising, rash noted.  Psych: appropriate mood and affect.  Dialysis access: LAVF with palpable bruit and thrill      Labs and Diagnostic Tests:  Urinalysis:  Lab Results   Component Value Date/Time    UCOLOR Yellow 03/10/2022 09:43 AM    TURBID Clear 03/10/2022 09:43 AM    USPGR <=1.005 03/10/2022 09:43 AM    UPH 5.5 03/10/2022 09:43 AM    UPROTEIN Negative 03/10/2022 09:43 AM    UAGLU Negative 03/10/2022 09:43 AM    UKET Negative 03/10/2022 09:43 AM    UBILE Negative 03/10/2022 09:43 AM    UBLD Trace-intact (A) 03/10/2022 09:43 AM    UROB 0.2 03/10/2022 09:43 AM       CBC with Diff:      Latest Ref Rng & Units 03/10/2022     9:43 AM 06/17/2021     8:45 AM   CBC with Diff   White Blood Cells  5.59  5.35    RBC  4.05  3.99    Hemoglobin  11.7  11.5    Hematocrit  37.8  36.3    MCV  93.3  91.0    MCH  28.9  28.8    MCHC 32.2 - 35.5 31.0  31.7  RDW  13.1  12.6    Platelet Count 182 - 369 147  141    MPV  9.7  10.1    Neutrophils  47.0  44.7    Absolute Neutrophil Count  2.63  2.39    Absolute Lymph Count  2.34  2.35    Absolute Monocyte Count  0.49  0.51    Absolute Eosinophil Count  0.08  0.07    Absolute Basophil Count  0.03  <0.03        CMP:      Latest Ref Rng & Units 03/10/2022     9:43 AM 06/17/2021     8:45 AM   CMP   Sodium mmol/L 139  139    Potassium  4.6  4.4    Chloride 98 - 107 109  106    CO2 23 - 31 19.0  22.0    Anion Gap  11  11    Blood Urea Nitrogen 9.8 - 20.1 28.0  26.0    Creatinine 0.57 - 1.11 mg/dl 1.61  0.96    Calcium  9.8  9.6    Total Protein  7.1  6.4    Albumin  4.4  4.3    Alk Phosphatase  88  103    ALT (SGPT)  13  14    Total Bilirubin mg/dL 0.45  4.09        CMV DNA Quant PCR   Date Value   03/05/2020 NOT DETECTED   11/06/2019 CMV DNA NOT DETECTED [IU]/mL   04/04/2019 CMV DNA NOT DETECTED [IU]/mL   02/07/2019 <2.30 Log IU/mL   01/24/2019 3.78 (H)     BK Virus Plasma Quant (no units)   Date Value   05/05/2021 82 (H)   03/05/2020 No DNA Detected   11/06/2019 BK Virus Not Detected   09/11/2019 BK Virus Detected (A)   07/24/2019 BK Virus Detected (A)     No results found for: EBVDNAQT    Other Common Labs:      Latest Ref Rng & Units 03/10/2022     9:43 AM 06/17/2021     8:45 AM   Other Common Labs   Phosphorus  3.6  4.2    Magnesium 1.6 - 2.6 1.5  1.5    Protein,UA  Negative  Negative    Creatinine, Random 47 - 110 25.36  61.94    Protein/CR ratio <0.2 0.29527  0.12116    Uric Acid 2.6 - 6.0 7.2  7.4        Imaging:             Allosure  01/18/19     Assessment and Plan:      Michele Benitez is a 62 y.o. 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 reduce to 0.75 and check in 3 days        #) Kidney function:  Baseline Scr 1.4 - 1.6 mg/dL   sCr 1.3  Ua bland, trace leucocytes   Upcr 0.1     #) HTN:  Home blood pressure readings controlled   Continue Coreg 25 mg BID ,   Continue lisinopril 40 mg at bedtime.  No orthostatics, educated on [place]        #)  ID   CMV D+/R+  Checking bk and cmv today        #) Heme   Wbc 6.2  HH 11.9  Plat 160       #)CAD: mild; continue on asa, crestor  and Coreg  Did not tolerate lipitor   Continue crestor 10 mg at bedtime.       #)Post transplant diabetes mellitus - controlled   Continue metformin to 1000 mg XR daily. Lifestyle modification reiterated.   Continue crestor 10 mg at bedtime as above        #) CKD MBD   Ca and phos wnl  Pth 72       #) Hx of CAD   Cardiac cath in 11/2015 - mild plaquing in RCA and LAD   Aspirin, statin therapy as above         General health maint. Managed by PCP  Follows w Derm q year. PAP and Mammo per PCP   Cancer screening: Mammogram, Pap Smear, colonoscopy per guidelines. Annual flu vaccine will get today   Avoid all live vaccines.  Fully vaccinated for COVID   Ok to get shingrix vaccination       RTC 1 year.   Labs every 4 months         Wandalee Ferdinand, MD      CC: [name]  CC: [place], Efraim Kaufmann

## 2022-05-22 ENCOUNTER — Encounter: Admit: 2022-05-22 | Discharge: 2022-05-22 | Payer: MEDICARE

## 2022-05-23 ENCOUNTER — Encounter: Admit: 2022-05-23 | Discharge: 2022-05-23 | Payer: MEDICARE

## 2022-05-23 MED FILL — TACROLIMUS 1 MG PO TB24: 1 mg | ORAL | 30 days supply | Qty: 30 | Fill #10 | Status: AC

## 2022-05-23 MED FILL — MYCOPHENOLATE SODIUM 180 MG PO TBEC: 180 mg | ORAL | 30 days supply | Qty: 120 | Fill #10 | Status: AC

## 2022-05-28 ENCOUNTER — Encounter: Admit: 2022-05-28 | Discharge: 2022-05-28 | Payer: MEDICARE

## 2022-05-28 DIAGNOSIS — Z940 Kidney transplant status: Secondary | ICD-10-CM

## 2022-05-28 DIAGNOSIS — D849 Immunodeficiency, unspecified: Secondary | ICD-10-CM

## 2022-05-28 MED ORDER — TACROLIMUS 0.75 MG PO TB24
.75 mg | ORAL_TABLET | Freq: Every day | ORAL | 3 refills | 30.00000 days | Status: AC
Start: 2022-05-28 — End: ?
  Filled 2022-06-04: qty 90, 90d supply, fill #1

## 2022-05-28 MED ORDER — JARDIANCE 10 MG PO TAB
10 mg | ORAL_TABLET | Freq: Every day | ORAL | 3 refills | Status: AC
Start: 2022-05-28 — End: ?
  Filled 2022-06-02: qty 30, 30d supply, fill #1

## 2022-05-29 ENCOUNTER — Encounter: Admit: 2022-05-29 | Discharge: 2022-05-29 | Payer: MEDICARE

## 2022-05-29 MED FILL — CARVEDILOL 25 MG PO TAB: 25 mg | ORAL | 90 days supply | Qty: 180 | Fill #4 | Status: AC

## 2022-06-01 ENCOUNTER — Encounter: Admit: 2022-06-01 | Discharge: 2022-06-01 | Payer: MEDICARE

## 2022-06-02 ENCOUNTER — Encounter: Admit: 2022-06-02 | Discharge: 2022-06-02 | Payer: MEDICARE

## 2022-06-02 NOTE — Progress Notes
Pharmacy Benefits Investigation    Medication name: JARDIANCE 10 MG PO TAB    The insurance does not require a prior authorization for the medication.    The out of pocket cost is $15.    Copay assistance is not available. All available forms of copay assistance were evaluated and none are currently available for Michele Benitez.    Michele Benitez stated the copay is affordable. Per patient's request, the medication will be ready for pick up at [facility].    [place] Patient Advocate

## 2022-06-03 ENCOUNTER — Encounter: Admit: 2022-06-03 | Discharge: 2022-06-03 | Payer: MEDICARE

## 2022-06-03 NOTE — Progress Notes
Pharmacy Benefits Investigation    Medication name: ENVARSUS XR PO    The insurance requires a prior authorization for the medication. The prior authorization renewal was submitted via CoverMyMeds. Will follow up in 2 business days.    PA number: [employer]  Specialty Pharmacy Patient Advocate

## 2022-06-04 ENCOUNTER — Encounter: Admit: 2022-06-04 | Discharge: 2022-06-04 | Payer: MEDICARE

## 2022-06-04 NOTE — Progress Notes
Pharmacy Benefits Investigation    Medication name: ENVARSUS XR PO    The prior authorization renewal was reapproved for Michele Benitez (PA number [id number]) from 06/03/2022 through 06/03/2023.    The out of pocket cost is $0.    The copay is affordable. Per patient's request, the medication will be ready for pick up at [facility].    [place] Patient Advocate

## 2022-06-11 ENCOUNTER — Encounter: Admit: 2022-06-11 | Discharge: 2022-06-11 | Payer: MEDICARE

## 2022-06-14 ENCOUNTER — Encounter: Admit: 2022-06-14 | Discharge: 2022-06-14 | Payer: MEDICARE

## 2022-06-15 ENCOUNTER — Encounter: Admit: 2022-06-15 | Discharge: 2022-06-15 | Payer: MEDICARE

## 2022-06-22 ENCOUNTER — Encounter: Admit: 2022-06-22 | Discharge: 2022-06-22 | Payer: MEDICARE

## 2022-06-23 ENCOUNTER — Encounter: Admit: 2022-06-23 | Discharge: 2022-06-23 | Payer: MEDICARE

## 2022-06-23 MED FILL — MYCOPHENOLATE SODIUM 180 MG PO TBEC: 180 mg | ORAL | 30 days supply | Qty: 120 | Fill #11 | Status: AC

## 2022-06-24 ENCOUNTER — Encounter: Admit: 2022-06-24 | Discharge: 2022-06-24 | Payer: MEDICARE

## 2022-06-24 MED FILL — TACROLIMUS 0.75 MG PO TB24: 0.75 mg | ORAL | 90 days supply | Qty: 90 | Fill #1 | Status: AC

## 2022-06-25 ENCOUNTER — Encounter: Admit: 2022-06-25 | Discharge: 2022-06-25 | Payer: MEDICARE

## 2022-06-25 MED FILL — METFORMIN 500 MG PO TB24: 500 mg | ORAL | 90 days supply | Qty: 180 | Fill #4 | Status: AC

## 2022-06-25 MED FILL — JARDIANCE 10 MG PO TAB: 10 mg | ORAL | 30 days supply | Qty: 30 | Fill #1 | Status: AC

## 2022-07-09 ENCOUNTER — Encounter: Admit: 2022-07-09 | Discharge: 2022-07-09 | Payer: MEDICARE

## 2022-07-20 ENCOUNTER — Encounter: Admit: 2022-07-20 | Discharge: 2022-07-20 | Payer: MEDICARE

## 2022-07-20 DIAGNOSIS — D849 Immunodeficiency, unspecified: Secondary | ICD-10-CM

## 2022-07-20 DIAGNOSIS — Z940 Kidney transplant status: Secondary | ICD-10-CM

## 2022-07-20 LAB — BASIC METABOLIC PANEL
ANION GAP: 11
BLD UREA NITROGEN: 23 — ABNORMAL HIGH (ref 9.8–20.1)
CALCIUM: 10
CHLORIDE: 109 — ABNORMAL HIGH (ref 98–107)
CO2: 18 — ABNORMAL LOW (ref 23–31)
CREATININE: 1.3 mg/dL — ABNORMAL HIGH (ref 0.57–1.11)
EGFR: 45 — ABNORMAL LOW (ref >59)
GLUCOSE,RANDOM: 99
POTASSIUM: 4.5
SODIUM: 138 mmol/L

## 2022-07-21 ENCOUNTER — Encounter: Admit: 2022-07-21 | Discharge: 2022-07-21 | Payer: MEDICARE

## 2022-07-21 MED FILL — ROSUVASTATIN 10 MG PO TAB: 10 mg | ORAL | 90 days supply | Qty: 90 | Fill #1 | Status: AC

## 2022-07-23 ENCOUNTER — Encounter: Admit: 2022-07-23 | Discharge: 2022-07-23 | Payer: MEDICARE

## 2022-07-25 ENCOUNTER — Encounter: Admit: 2022-07-25 | Discharge: 2022-07-25 | Payer: MEDICARE

## 2022-07-28 ENCOUNTER — Encounter: Admit: 2022-07-28 | Discharge: 2022-07-28 | Payer: MEDICARE

## 2022-07-28 MED FILL — MYCOPHENOLATE SODIUM 180 MG PO TBEC: 180 mg | ORAL | 30 days supply | Qty: 120 | Fill #12 | Status: AC

## 2022-07-30 ENCOUNTER — Encounter: Admit: 2022-07-30 | Discharge: 2022-07-30 | Payer: MEDICARE

## 2022-07-30 MED FILL — JARDIANCE 10 MG PO TAB: 10 mg | ORAL | 30 days supply | Qty: 30 | Fill #2 | Status: AC

## 2022-08-11 ENCOUNTER — Encounter: Admit: 2022-08-11 | Discharge: 2022-08-11 | Payer: MEDICARE

## 2022-08-11 ENCOUNTER — Ambulatory Visit: Admit: 2022-08-11 | Discharge: 2022-08-12 | Payer: MEDICARE

## 2022-08-11 DIAGNOSIS — D229 Melanocytic nevi, unspecified: Secondary | ICD-10-CM

## 2022-08-11 DIAGNOSIS — L821 Other seborrheic keratosis: Secondary | ICD-10-CM

## 2022-08-11 DIAGNOSIS — L578 Other skin changes due to chronic exposure to nonionizing radiation: Secondary | ICD-10-CM

## 2022-08-11 DIAGNOSIS — D1801 Hemangioma of skin and subcutaneous tissue: Secondary | ICD-10-CM

## 2022-08-11 DIAGNOSIS — D849 Immunodeficiency, unspecified: Secondary | ICD-10-CM

## 2022-08-11 NOTE — Patient Instructions
Vitamin D  - We recommend Vitamin D supplementation after a fatty meal, especially if there is no contraindications such as kidney stones    Moles  --Common melanocytic nevi (moles) tend to be =6 mm in diameter and symmetric with even pigmentation, round or oval shape, regular outline, and sharp, non-fuzzy border.  --Dermal nevi stick out from the skin, but if they are soft they are usually not worrisome.  --Flaky seemingly stuck-on brown bumps are usually benign keratoses, not moles.  --Bright red smooth bumps that do not bleed are usually benign blood vessel lesions (cherry angiomas), not moles.  --Atypical nevi/clinical features of possible melanoma include asymmetry, border irregularities, color variability, and diameter >6 mm.  The earliest sign of melanoma is usually a rapidly growing mole.  --About half of melanoma arises in an existing mole and up to half on normal skin.  --It is normal to get new moles until the age of 30-40 years old.  --Multiple atypical nevi are a marker of increased risk of melanoma. The risk of melanoma depends also upon the total number of nevi, family and/or personal history of melanoma, and sun exposure history.   --It is important to look at your moles once a month.  It may help to follow them with photos such as on your smart phone.  Looking once a month you can notice rapid changes and call if these occur.  --Using sunscreens and sun avoidance will decrease your risk of developing melanoma.  The best sun protection is sun avoidance, including with clothing such as long sleeves and a broad brimmed hat.  The best sunscreens are SPF 30 or above cream based with zinc oxide.  One ounce (shot glass sized) amount is needed for an adult.  It should be reapplied every 2 hours if possible.  --Any tanning bed use will increase your risk of melanoma significantly.    Sun Protection   UPF/SPF rated clothing (gloves, long sleeves, scarves); broad-brimmed hats (NOT ball caps!)   RIT Sunguard laundry additive can increase the SPF value of your everyday clothing   Cowboy hats protect from sun; baseball hats don't   Here are several recommended zinc-based sunscreen brands in aplphabetical order (always read the ingredient list, as many brands have multiple varieties of sunscreens and not all are zinc-based)   Badger, Bare Minerals, Blue Lizard, CeraVe, Cotz, Goddess Garden, Green Screen,  [employer], Mineral Fusion, SkinCeuticals, Vanicream   2 shot-glases = whole body    Melanoma Patient Information    Also called malignant melanoma     Skin cancer screening: If you notice a mole that differs from others or one that changes, bleeds, or itches, see a dermatologist.   Melanoma is a type of skin cancer. Anyone can get melanoma. When found early and treated, the cure rate is nearly 100%. Allowed to grow, melanoma can spread to other parts of the body. Melanoma can spread quickly. When melanoma spreads, it can be deadly.Dermatologists believe that the number of deaths from melanoma would be much lower if people:  Knew the warning signs of melanoma.   Learned how to examine their skin for signs of skin cancer.   Took the time to examine their skin.   It's important to take time to look at the moles on your skin because this is a good way to find melanoma early. When checking your skin, you should look for the ABCDEs of melanoma.     ABCDE's of melanoma:  When performing monthly   skin exams for your moles or new moles, remember the ABCDE's of melanoma:    A - Asymmetry. (Concerning if spot is not symmetric)  B - Border. (Irregular border or notched border are concerning)  C - Color. (Multiple colors or changes in color are concerning.)  D - Diameter. (Larger than 6mm, ie, a pencil eraser, is concerning.)  E - Evolution. (An evolving or changing spot is concerning. If new itch, tenderness, or bleeding develop, these are concerning changes too.  See further explanation below:    Melanoma: Signs and symptoms Anyone can get melanoma. It's important to take time to look at the moles on your skin because this is a good way to find melanoma early. When checking your skin, you should look for the ABCDEs of melanoma.     ABCDEs of melanoma     A = Asymmetry  One half is unlike the other half.       B = Border  An irregular, scalloped, or poorly defined border.       C = Color  Is varied from one area to another; has shades of tan, brown or black, or is sometimes white, red, or blue.       D = Diameter  Melanomas usually greater than 6mm (the size of a pencil eraser) when diagnosed, but they can be smaller.       E = Evolving  A mole or skin lesion that looks different from the rest or is changing in size, shape, or color.    !! If you see a mole or new spot on your skin that has any of the ABCDEs, immediately make an appointment to see a dermatologist.    Signs of melanoma  The most common early signs (what you see) of melanoma are:     Growing mole on your skin.   Unusual looking mole on your skin or a mole that does not look like any other mole on your skin (the ugly duckling).   Non-uniform mole (has an odd shape, uneven or uncertain border, different colors).     Symptoms of melanoma  In the early stages, melanoma may not cause any symptoms (what you feel). But sometimes melanoma will:    Itch.    Bleed.    Feel painful.   Many melanomas have these signs and symptoms, but not all. There are different types of melanoma. One type can first appear as a brown or black streak underneath a fingernail or toenail. Melanoma also can look like a bruise that just won't heal.     Who gets melanoma?  Anyone can get melanoma. Most people who get it have light skin, but people who have brown and black skin also get melanoma.   Some people have a higher risk of getting melanoma. These people have the following traits:   Skin    Fair skin (The risk is higher if the person also has red or blond hair and blue or green eyes). Sun-sensitive skin (rarely tans or burns easily).    50-plus moles, large moles, or unusual-looking moles.    If you have had bad sunburns or spent time tanning (sun, tanning beds, or sun lamps), you also have a higher risk of getting melanoma.   Men older than 50 are at a higher risk for developing skin cancers, including melanoma. Learning how to check your skin and getting skin exams can help detect skin cancer.    Family/medical history   Melanoma runs   in the family (parent, child, sibling, cousin, aunt, uncle had melanoma).   You had another skin cancer, but most especially another melanoma.   A weakened immune system.      Research shows that indoor tanning increases a person's melanoma risk by 75%. The risk also may increase if you had breast or thyroid cancer.    More people getting melanoma  Fewer people are getting most types of cancer. Melanoma is different. More people are getting melanoma. Many are white men who are 50 years or older. More young people also are getting melanoma. Melanoma is now the most common cancer among people 25-29 years old. Even teenagers are getting melanoma.    What causes melanoma?  Ultraviolet (UV) radiation is a major contributor in most cases. We get UV radiation from the sun, tanning beds, and sun lamps. Heredity also plays a role. Research shows that if a close blood relative (parent, child, sibling, aunt, uncle) had melanoma, a person has a much greater risk of getting melanoma.     How do dermatologists diagnose melanoma?  To diagnose melanoma, a dermatologist begins by looking at the patient's skin. A dermatologist will carefully examine moles and other suspicious spots. To get a better look, a dermatologist may use a device called a dermoscope.      Vitamin D    Our bodies need vitamin D to build strong and healthy bones. Vitamin D helps the body absorb the calcium that our bones require.     For a healthy person, the recommended daily dietary allowance is 600 international units for people of 1-70 years old, and 800 international units for people older than 71 years. More vitamin D is not better. Higher amounts of vitamin D could be harmful, leading to many health problems such as high blood pressure and kidney damage.     American academy of Dermatology recommending everyone get vitamin D from foods naturally rich in vitamin D, foods and beverages fortified with vitamin D or vitamin D supplements. The foods that contain the greatest amount are fatty fishes such as salmon, tuna and mackerel. Fish liver oil is another good source.     One of the sources to look up vitamin amount is through National Agriculture library. Http://ndb.nal.usda.gov/. This can help you find out whether you get enough vitamin D from your diet. If you are like many people, you may not be getting your recommended dietary allowance of vitamin D. You may want to change the foods that you eat or take a vitamin D supplements. Before you start taking a vitamin D supplement, talk with your doctor.     Vitamin D is produced in the skin by UV light, but the amount is highly variable and depends on many factors. However, getting vitamin D from the sun or tanning beds can 1) increase your risk of developing skin cancer including melanoma which can be deadly, 2) resulting premature skin aging (wrinkles, age spots, blotchy complexion) and 3) leading to a weakened immune system. Therefore, American academy of Dermatology recommend getting vitamin D safely from foods, beverages and supplements.

## 2022-08-11 NOTE — Progress Notes
Date of Service: 08/11/2022    Subjective:             Michele Benitez is a 62 y.o. female.    History of Present Illness    Return patient.  LV 06/17/2021 with Dr. Kathie Dike.  History unchanged unless otherwise noted.      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    - These have been present for many years.   - There is no history of blistering sunburns.  - None are itching, bleeding, or painful  - Currently taking Vit D daily      #Dermal nevus, R forearm   - S/p shave biopsy 01/22/20      Personal Hx: No history of skin cancer.   Family History: No family history of skin cancer.   Social History: Housewife          {  Review of Systems   Constitutional:  Negative for appetite change and unexpected weight change.   Gastrointestinal:  Negative for diarrhea, nausea and vomiting.           Objective:         aspirin EC 81 mg tablet Take one tablet by mouth daily.    carvediloL (COREG) 25 mg tablet Take one tablet by mouth twice daily.    empagliflozin (JARDIANCE) 10 mg tablet Take one tablet by mouth daily.    famotidine (PEPCID) 20 mg tablet Take one tablet by mouth daily.    gabapentin (NEURONTIN) 300 mg capsule Take one capsule by mouth at bedtime daily.    lisinopriL (ZESTRIL) 40 mg tablet Take one tablet by mouth at bedtime daily.    metFORMIN-XR (GLUCOPHAGE XR) 500 mg extended release tablet Take two tablets by mouth daily with dinner.    multivitamin (MULTIPLE VITAMIN-MINERALS) tablet Take one tablet by mouth daily.    mycophenolate sodium (MYFORTIC) 180 mg tablet,delayed release Take two tablets by mouth twice daily.    ondansetron (ZOFRAN ODT) 4 mg rapid dissolve tablet Dissolve one tablet by mouth every 8 hours as needed for Nausea or Vomiting. Place on tongue to disolve.    rosuvastatin (CRESTOR) 10 mg tablet Take one tablet by mouth at bedtime daily.    sodium bicarbonate 650 mg tablet Take one tablet by mouth twice daily.    tacrolimus XR (ENVARSUS XR) 0.75 mg ER tablet Take one tablet by mouth daily.     Vitals:    08/11/22 0829   PainSc: Zero   Weight: 88.5 kg (195 lb)   Height: 167.6 cm (5' 6)     Body mass index is 31.47 kg/m?Marland Kitchen     Physical Exam    General: Alert and oriented, no acute distress  Eyes: conjunctiva clear, normal EOM    Areas Examined (all normal unless noted below):  Head/Face  Neck  Chest/breasts/axillae  Back  Abdomen  Buttocks  R upper ext  L upper ext  R lower ext  L lower ext    Pertinent findings include:      Multiple brown and tan evenly pigmented macules are distributed over the head, neck, trunk, arms and legs.  All have symmetric similar dermascopic findings with primarily globular and reticular patterns.    Soft, pigmented, stuck-on-appearing papules are distributed over the trunk.  All have symmetric pebbled dermoscopic findings.    Scattered bright  red cherry papules on the trunk and extremities.    Diffuse photodamage of the sun exposed areas of the face, neck, extremities.         Assessment and Plan:    #Immunosuppression  - discussed increased risk of skin cancers  - discussed sun protection    #Multiple Benign Nevi  - Will cont to monitor  - Counseled on ABCD's of melanoma  - Recommend on photoprotection with at least SPF 30+ sunscreen  - Recommend sun protective clothing (UPF shirts, broad-brimmed hats)  - RTC for any new, changing or symptomatic lesions    #Seborrheic Keratoses  - Benign etiology discussed. Reassurance provided. Observe.    #Cherry Angiomas  - Benign etiology discussed. Reassurance provided. Observe.    #Diffuse Photodamage  - Counseled on photoprotection with at least SPF 30+ sunscreen   - Recommend sun protective clothing (UPF shirts, broad-brimmed hats)    RTC 1 year, sooner PRN

## 2022-08-13 ENCOUNTER — Encounter: Admit: 2022-08-13 | Discharge: 2022-08-13 | Payer: MEDICARE

## 2022-08-19 ENCOUNTER — Encounter: Admit: 2022-08-19 | Discharge: 2022-08-19 | Payer: MEDICARE

## 2022-08-19 MED ORDER — MYCOPHENOLATE SODIUM 180 MG PO TBEC
360 mg | ORAL_TABLET | Freq: Two times a day (BID) | ORAL | 11 refills | Status: AC
Start: 2022-08-19 — End: ?
  Filled 2022-08-24: qty 120, 30d supply, fill #1

## 2022-08-20 ENCOUNTER — Encounter: Admit: 2022-08-20 | Discharge: 2022-08-20 | Payer: MEDICARE

## 2022-08-20 MED FILL — LISINOPRIL 40 MG PO TAB: 40 mg | ORAL | 90 days supply | Qty: 90 | Fill #1 | Status: AC

## 2022-08-21 ENCOUNTER — Encounter: Admit: 2022-08-21 | Discharge: 2022-08-21 | Payer: MEDICARE

## 2022-08-23 ENCOUNTER — Encounter: Admit: 2022-08-23 | Discharge: 2022-08-23 | Payer: MEDICARE

## 2022-08-24 ENCOUNTER — Encounter: Admit: 2022-08-24 | Discharge: 2022-08-24 | Payer: MEDICARE

## 2022-08-25 ENCOUNTER — Encounter: Admit: 2022-08-25 | Discharge: 2022-08-25 | Payer: MEDICARE

## 2022-08-27 ENCOUNTER — Encounter: Admit: 2022-08-27 | Discharge: 2022-08-27 | Payer: MEDICARE

## 2022-08-27 MED FILL — JARDIANCE 10 MG PO TAB: 10 mg | ORAL | 30 days supply | Qty: 30 | Fill #3 | Status: AC

## 2022-08-31 ENCOUNTER — Encounter: Admit: 2022-08-31 | Discharge: 2022-08-31 | Payer: MEDICARE

## 2022-08-31 MED ORDER — CARVEDILOL 25 MG PO TAB
25 mg | ORAL_TABLET | Freq: Two times a day (BID) | ORAL | 3 refills | 90.00000 days | Status: AC
Start: 2022-08-31 — End: ?
  Filled 2022-09-02: qty 180, 90d supply, fill #1

## 2022-09-01 ENCOUNTER — Encounter: Admit: 2022-09-01 | Discharge: 2022-09-01 | Payer: MEDICARE

## 2022-09-02 ENCOUNTER — Encounter: Admit: 2022-09-02 | Discharge: 2022-09-02 | Payer: MEDICARE

## 2022-09-04 ENCOUNTER — Encounter: Admit: 2022-09-04 | Discharge: 2022-09-04 | Payer: MEDICARE

## 2022-09-04 MED FILL — JARDIANCE 10 MG PO TAB: 10 mg | ORAL | 30 days supply | Qty: 30 | Fill #3 | Status: CP

## 2022-09-04 MED FILL — CARVEDILOL 25 MG PO TAB: 25 mg | ORAL | 90 days supply | Qty: 180 | Fill #1 | Status: CP

## 2022-09-07 ENCOUNTER — Encounter: Admit: 2022-09-07 | Discharge: 2022-09-07 | Payer: MEDICARE

## 2022-09-07 DIAGNOSIS — D849 Immunodeficiency, unspecified: Secondary | ICD-10-CM

## 2022-09-07 DIAGNOSIS — Z940 Kidney transplant status: Secondary | ICD-10-CM

## 2022-09-07 MED ORDER — METFORMIN 500 MG PO TB24
1000 mg | ORAL_TABLET | Freq: Every day | ORAL | 3 refills | Status: AC
Start: 2022-09-07 — End: ?
  Filled 2022-09-10: qty 180, 90d supply, fill #1

## 2022-09-10 ENCOUNTER — Encounter: Admit: 2022-09-10 | Discharge: 2022-09-10 | Payer: MEDICARE

## 2022-09-11 ENCOUNTER — Encounter: Admit: 2022-09-11 | Discharge: 2022-09-11 | Payer: MEDICARE

## 2022-09-14 ENCOUNTER — Encounter: Admit: 2022-09-14 | Discharge: 2022-09-14 | Payer: MEDICARE

## 2022-09-15 ENCOUNTER — Encounter: Admit: 2022-09-15 | Discharge: 2022-09-15 | Payer: MEDICARE

## 2022-09-15 MED FILL — TACROLIMUS 0.75 MG PO TB24: 0.75 mg | ORAL | 90 days supply | Qty: 90 | Fill #2 | Status: AC

## 2022-09-29 ENCOUNTER — Encounter: Admit: 2022-09-29 | Discharge: 2022-09-29 | Payer: MEDICARE

## 2022-09-30 ENCOUNTER — Encounter: Admit: 2022-09-30 | Discharge: 2022-09-30 | Payer: MEDICARE

## 2022-09-30 MED FILL — JARDIANCE 10 MG PO TAB: 10 mg | ORAL | 30 days supply | Qty: 30 | Fill #4 | Status: AC

## 2022-09-30 MED FILL — MYCOPHENOLATE SODIUM 180 MG PO TBEC: 180 mg | ORAL | 30 days supply | Qty: 120 | Fill #2 | Status: AC

## 2022-10-16 ENCOUNTER — Encounter: Admit: 2022-10-16 | Discharge: 2022-10-16 | Payer: MEDICARE

## 2022-10-17 MED FILL — ROSUVASTATIN 10 MG PO TAB: 10 mg | ORAL | 90 days supply | Qty: 90 | Fill #2 | Status: AC

## 2022-10-22 ENCOUNTER — Encounter: Admit: 2022-10-22 | Discharge: 2022-10-22 | Payer: MEDICARE

## 2022-10-22 MED FILL — MYCOPHENOLATE SODIUM 180 MG PO TBEC: 180 mg | ORAL | 30 days supply | Qty: 120 | Fill #3 | Status: AC

## 2022-10-30 ENCOUNTER — Encounter: Admit: 2022-10-30 | Discharge: 2022-10-30 | Payer: MEDICARE

## 2022-10-31 ENCOUNTER — Encounter: Admit: 2022-10-31 | Discharge: 2022-10-31 | Payer: MEDICARE

## 2022-10-31 MED FILL — JARDIANCE 10 MG PO TAB: 10 mg | ORAL | 30 days supply | Qty: 30 | Fill #5 | Status: AC

## 2022-11-03 ENCOUNTER — Encounter: Admit: 2022-11-03 | Discharge: 2022-11-03 | Payer: Medicaid Other

## 2022-11-10 ENCOUNTER — Encounter: Admit: 2022-11-10 | Discharge: 2022-11-10 | Payer: Medicaid Other

## 2022-11-12 ENCOUNTER — Encounter: Admit: 2022-11-12 | Discharge: 2022-11-12 | Payer: Medicaid Other

## 2022-11-13 MED FILL — LISINOPRIL 40 MG PO TAB: 40 mg | ORAL | 90 days supply | Qty: 90 | Fill #2 | Status: AC

## 2022-11-24 ENCOUNTER — Encounter: Admit: 2022-11-24 | Discharge: 2022-11-24 | Payer: Medicaid Other

## 2022-11-25 ENCOUNTER — Encounter: Admit: 2022-11-25 | Discharge: 2022-11-25 | Payer: Medicaid Other

## 2022-11-25 MED FILL — CARVEDILOL 25 MG PO TAB: 25 mg | ORAL | 90 days supply | Qty: 180 | Fill #2 | Status: AC

## 2022-11-26 ENCOUNTER — Encounter: Admit: 2022-11-26 | Discharge: 2022-11-26 | Payer: Medicaid Other

## 2022-11-27 ENCOUNTER — Encounter: Admit: 2022-11-27 | Discharge: 2022-11-27 | Payer: Medicaid Other

## 2022-11-29 ENCOUNTER — Encounter: Admit: 2022-11-29 | Discharge: 2022-11-29 | Payer: Medicaid Other

## 2022-11-30 ENCOUNTER — Encounter: Admit: 2022-11-30 | Discharge: 2022-11-30 | Payer: Medicaid Other

## 2022-11-30 MED FILL — JARDIANCE 10 MG PO TAB: 10 mg | ORAL | 30 days supply | Qty: 30 | Fill #6 | Status: AC

## 2022-12-01 ENCOUNTER — Encounter: Admit: 2022-12-01 | Discharge: 2022-12-01 | Payer: Medicaid Other

## 2022-12-02 ENCOUNTER — Encounter: Admit: 2022-12-02 | Discharge: 2022-12-02 | Payer: Medicaid Other

## 2022-12-02 MED FILL — METFORMIN 500 MG PO TB24: 500 mg | ORAL | 90 days supply | Qty: 180 | Fill #2 | Status: AC

## 2022-12-03 ENCOUNTER — Encounter: Admit: 2022-12-03 | Discharge: 2022-12-03 | Payer: Medicaid Other

## 2022-12-04 ENCOUNTER — Encounter: Admit: 2022-12-04 | Discharge: 2022-12-04 | Payer: Medicaid Other

## 2022-12-07 ENCOUNTER — Encounter: Admit: 2022-12-07 | Discharge: 2022-12-07 | Payer: Medicaid Other

## 2022-12-08 ENCOUNTER — Encounter: Admit: 2022-12-08 | Discharge: 2022-12-08 | Payer: Medicaid Other

## 2022-12-08 MED FILL — MYCOPHENOLATE SODIUM 180 MG PO TBEC: 180 mg | ORAL | 30 days supply | Qty: 120 | Fill #4 | Status: AC

## 2022-12-08 MED FILL — TACROLIMUS 0.75 MG PO TB24: 0.75 mg | ORAL | 90 days supply | Qty: 90 | Fill #3 | Status: AC

## 2022-12-25 ENCOUNTER — Encounter: Admit: 2022-12-25 | Discharge: 2022-12-25 | Payer: Medicaid Other

## 2022-12-29 ENCOUNTER — Encounter: Admit: 2022-12-29 | Discharge: 2022-12-29 | Payer: Medicaid Other

## 2023-01-01 ENCOUNTER — Encounter: Admit: 2023-01-01 | Discharge: 2023-01-01 | Payer: Medicaid Other

## 2023-01-01 MED FILL — JARDIANCE 10 MG PO TAB: 10 mg | ORAL | 30 days supply | Qty: 30 | Fill #7 | Status: AC

## 2023-01-03 ENCOUNTER — Encounter: Admit: 2023-01-03 | Discharge: 2023-01-03 | Payer: Medicaid Other

## 2023-01-04 ENCOUNTER — Encounter: Admit: 2023-01-04 | Discharge: 2023-01-04 | Payer: Medicaid Other

## 2023-01-04 MED FILL — MYCOPHENOLATE SODIUM 180 MG PO TBEC: 180 mg | ORAL | 60 days supply | Qty: 240 | Fill #5 | Status: AC

## 2023-01-18 ENCOUNTER — Encounter: Admit: 2023-01-18 | Discharge: 2023-01-18 | Payer: PRIVATE HEALTH INSURANCE

## 2023-01-19 ENCOUNTER — Encounter: Admit: 2023-01-19 | Discharge: 2023-01-19 | Payer: PRIVATE HEALTH INSURANCE

## 2023-01-19 MED FILL — ROSUVASTATIN 10 MG PO TAB: 10 mg | ORAL | 90 days supply | Qty: 90 | Fill #3 | Status: AC

## 2023-01-28 ENCOUNTER — Encounter: Admit: 2023-01-28 | Discharge: 2023-01-28 | Payer: PRIVATE HEALTH INSURANCE

## 2023-01-31 ENCOUNTER — Encounter: Admit: 2023-01-31 | Discharge: 2023-01-31 | Payer: PRIVATE HEALTH INSURANCE

## 2023-01-31 MED FILL — JARDIANCE 10 MG PO TAB: 10 mg | ORAL | 30 days supply | Qty: 30 | Fill #8 | Status: AC

## 2023-02-01 ENCOUNTER — Encounter: Admit: 2023-02-01 | Discharge: 2023-02-01 | Payer: PRIVATE HEALTH INSURANCE

## 2023-02-14 ENCOUNTER — Encounter: Admit: 2023-02-14 | Discharge: 2023-02-14 | Payer: PRIVATE HEALTH INSURANCE

## 2023-02-14 MED FILL — LISINOPRIL 40 MG PO TAB: 40 mg | ORAL | 90 days supply | Qty: 90 | Fill #3 | Status: AC

## 2023-02-21 ENCOUNTER — Encounter: Admit: 2023-02-21 | Discharge: 2023-02-21 | Payer: PRIVATE HEALTH INSURANCE

## 2023-02-22 MED FILL — METFORMIN 500 MG PO TB24: 500 mg | ORAL | 90 days supply | Qty: 180 | Fill #3 | Status: AC

## 2023-02-22 MED FILL — CARVEDILOL 25 MG PO TAB: 25 mg | ORAL | 90 days supply | Qty: 180 | Fill #3 | Status: AC

## 2023-02-23 ENCOUNTER — Encounter: Admit: 2023-02-23 | Discharge: 2023-02-23 | Payer: PRIVATE HEALTH INSURANCE

## 2023-02-25 ENCOUNTER — Encounter: Admit: 2023-02-25 | Discharge: 2023-02-25 | Payer: PRIVATE HEALTH INSURANCE

## 2023-02-26 ENCOUNTER — Encounter: Admit: 2023-02-26 | Discharge: 2023-02-26 | Payer: PRIVATE HEALTH INSURANCE

## 2023-02-26 ENCOUNTER — Encounter: Admit: 2023-02-26 | Discharge: 2023-02-26 | Payer: Medicaid Other

## 2023-02-28 ENCOUNTER — Encounter: Admit: 2023-02-28 | Discharge: 2023-02-28 | Payer: PRIVATE HEALTH INSURANCE

## 2023-02-28 MED FILL — MYCOPHENOLATE SODIUM 180 MG PO TBEC: 180 mg | ORAL | 60 days supply | Qty: 240 | Fill #6 | Status: AC

## 2023-03-01 ENCOUNTER — Encounter: Admit: 2023-03-01 | Discharge: 2023-03-01 | Payer: Medicaid Other

## 2023-03-01 ENCOUNTER — Encounter: Admit: 2023-03-01 | Discharge: 2023-03-01 | Payer: PRIVATE HEALTH INSURANCE

## 2023-03-02 ENCOUNTER — Encounter: Admit: 2023-03-02 | Discharge: 2023-03-02 | Payer: PRIVATE HEALTH INSURANCE

## 2023-03-02 ENCOUNTER — Encounter: Admit: 2023-03-02 | Discharge: 2023-03-02 | Payer: Medicaid Other

## 2023-03-02 DIAGNOSIS — D849 Immunodeficiency, unspecified: Secondary | ICD-10-CM

## 2023-03-02 DIAGNOSIS — E785 Hyperlipidemia, unspecified: Secondary | ICD-10-CM

## 2023-03-02 DIAGNOSIS — E0822 Diabetes mellitus due to underlying condition with diabetic chronic kidney disease: Secondary | ICD-10-CM

## 2023-03-02 DIAGNOSIS — Z87898 Personal history of other specified conditions: Secondary | ICD-10-CM

## 2023-03-02 DIAGNOSIS — Z940 Kidney transplant status: Secondary | ICD-10-CM

## 2023-03-02 DIAGNOSIS — Z79899 Other long term (current) drug therapy: Secondary | ICD-10-CM

## 2023-03-02 DIAGNOSIS — R808 Other proteinuria: Secondary | ICD-10-CM

## 2023-03-05 ENCOUNTER — Encounter: Admit: 2023-03-05 | Discharge: 2023-03-05 | Payer: PRIVATE HEALTH INSURANCE

## 2023-03-05 MED FILL — TACROLIMUS 0.75 MG PO TB24: 0.75 mg | ORAL | 90 days supply | Qty: 90 | Fill #4 | Status: AC

## 2023-03-05 MED FILL — JARDIANCE 10 MG PO TAB: 10 mg | ORAL | 30 days supply | Qty: 30 | Fill #9 | Status: AC

## 2023-03-16 ENCOUNTER — Encounter: Admit: 2023-03-16 | Discharge: 2023-03-16 | Payer: PRIVATE HEALTH INSURANCE

## 2023-03-18 ENCOUNTER — Encounter: Admit: 2023-03-18 | Discharge: 2023-03-18 | Payer: PRIVATE HEALTH INSURANCE

## 2023-03-30 ENCOUNTER — Encounter: Admit: 2023-03-30 | Discharge: 2023-03-30 | Payer: PRIVATE HEALTH INSURANCE

## 2023-03-31 ENCOUNTER — Encounter: Admit: 2023-03-31 | Discharge: 2023-03-31 | Payer: PRIVATE HEALTH INSURANCE

## 2023-04-02 ENCOUNTER — Encounter: Admit: 2023-04-02 | Discharge: 2023-04-02 | Payer: PRIVATE HEALTH INSURANCE

## 2023-04-03 MED FILL — JARDIANCE 10 MG PO TAB: 10 mg | ORAL | 90 days supply | Qty: 90 | Fill #10 | Status: AC

## 2023-04-06 ENCOUNTER — Encounter: Admit: 2023-04-06 | Discharge: 2023-04-06 | Payer: PRIVATE HEALTH INSURANCE

## 2023-04-06 NOTE — Progress Notes
Pharmacy Benefits Investigation    Medication name: empagliflozin (JARDIANCE) 10 mg tablet  Medication status: continuation (refill)    The insurance requires a prior authorization for the medication. The prior authorization renewal was submitted via CoverMyMeds.    Message from insurance: Prior Authorization NOT required        [facility]  Specialty Pharmacy Patient Advocate

## 2023-04-12 ENCOUNTER — Encounter: Admit: 2023-04-12 | Discharge: 2023-04-12 | Payer: PRIVATE HEALTH INSURANCE

## 2023-04-13 ENCOUNTER — Encounter: Admit: 2023-04-13 | Discharge: 2023-04-13 | Payer: PRIVATE HEALTH INSURANCE

## 2023-04-13 MED FILL — ROSUVASTATIN 10 MG PO TAB: 10 mg | ORAL | 90 days supply | Qty: 90 | Fill #4 | Status: AC

## 2023-04-16 ENCOUNTER — Encounter: Admit: 2023-04-16 | Discharge: 2023-04-16 | Payer: PRIVATE HEALTH INSURANCE

## 2023-04-19 ENCOUNTER — Encounter: Admit: 2023-04-19 | Discharge: 2023-04-19 | Payer: PRIVATE HEALTH INSURANCE

## 2023-04-21 ENCOUNTER — Encounter: Admit: 2023-04-21 | Discharge: 2023-04-21 | Payer: PRIVATE HEALTH INSURANCE

## 2023-04-22 ENCOUNTER — Encounter: Admit: 2023-04-22 | Discharge: 2023-04-22 | Payer: No Typology Code available for payment source

## 2023-04-22 ENCOUNTER — Encounter: Admit: 2023-04-22 | Discharge: 2023-04-22 | Payer: PRIVATE HEALTH INSURANCE

## 2023-04-23 MED FILL — MYCOPHENOLATE SODIUM 180 MG PO TBEC: 180 mg | ORAL | 60 days supply | Qty: 240 | Fill #7 | Status: AC

## 2023-04-26 IMAGING — MG MAMMOGRAM, DIGITAL SCREEN BILA
1 series · 8 of 8 positions shown · non-contrast
Comparison: none

[Series 2: R CC · right · 8 of 8 slices shown]
[im 1/8]
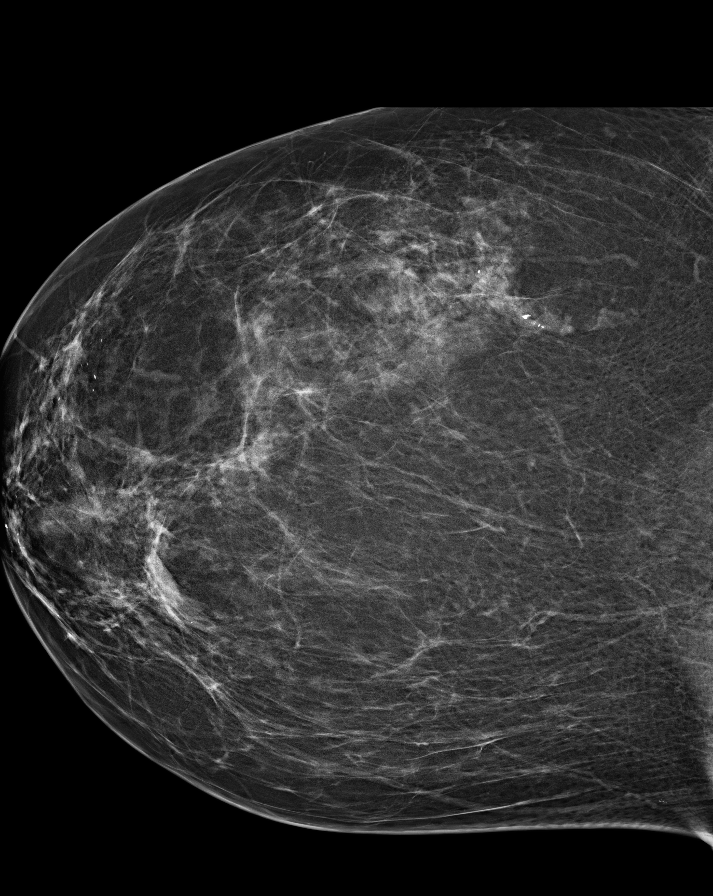
[im 2/8]
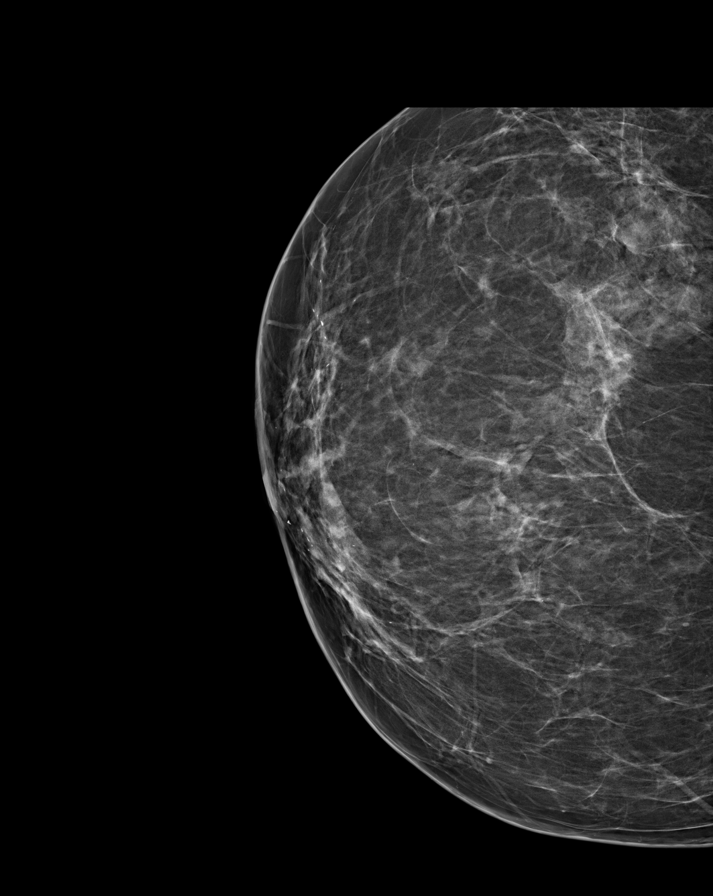
[im 3/8]
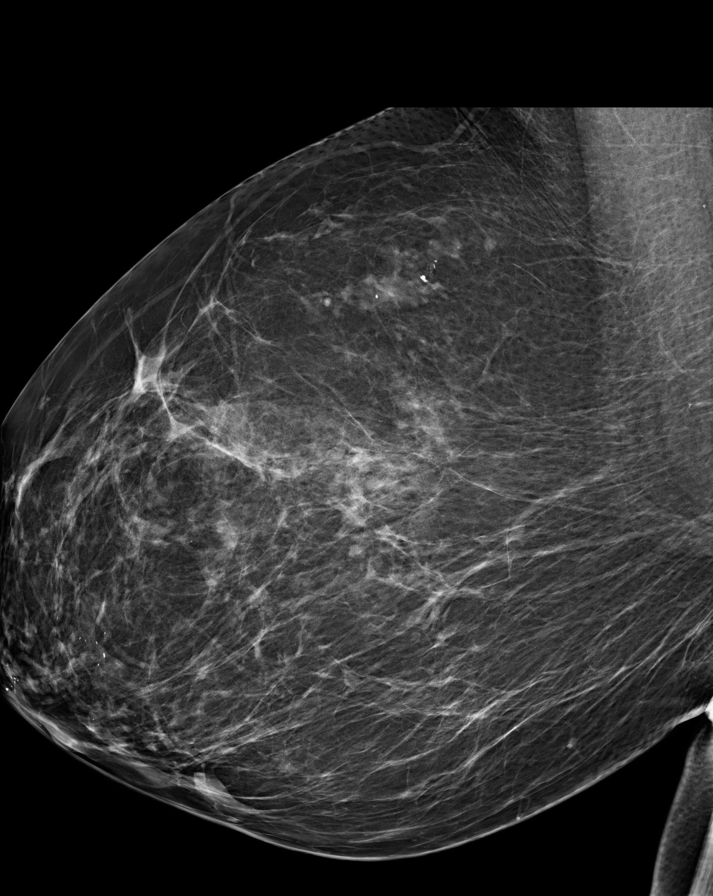
[im 4/8]
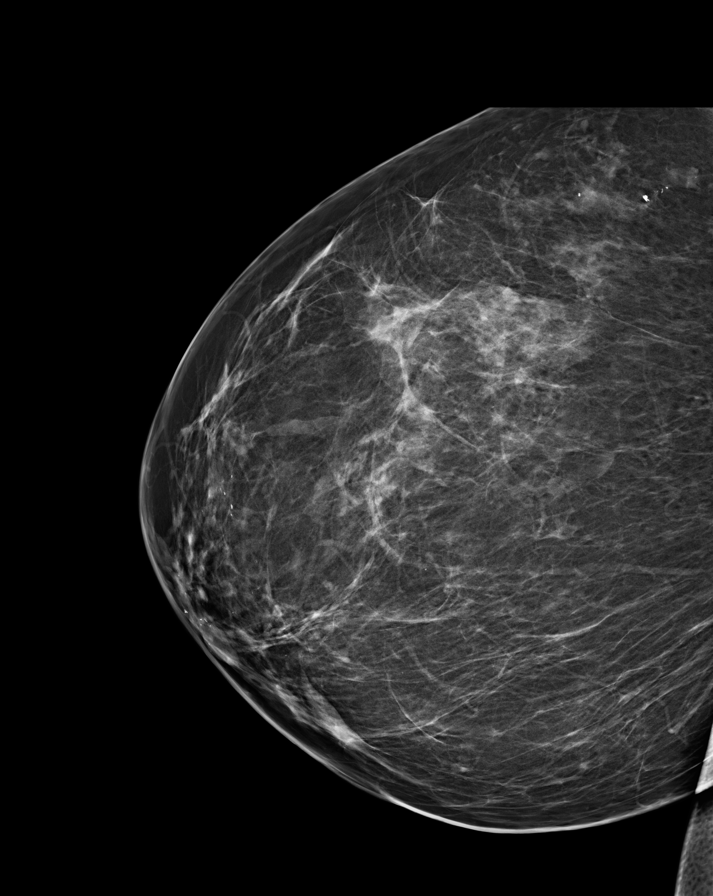
[im 5/8]
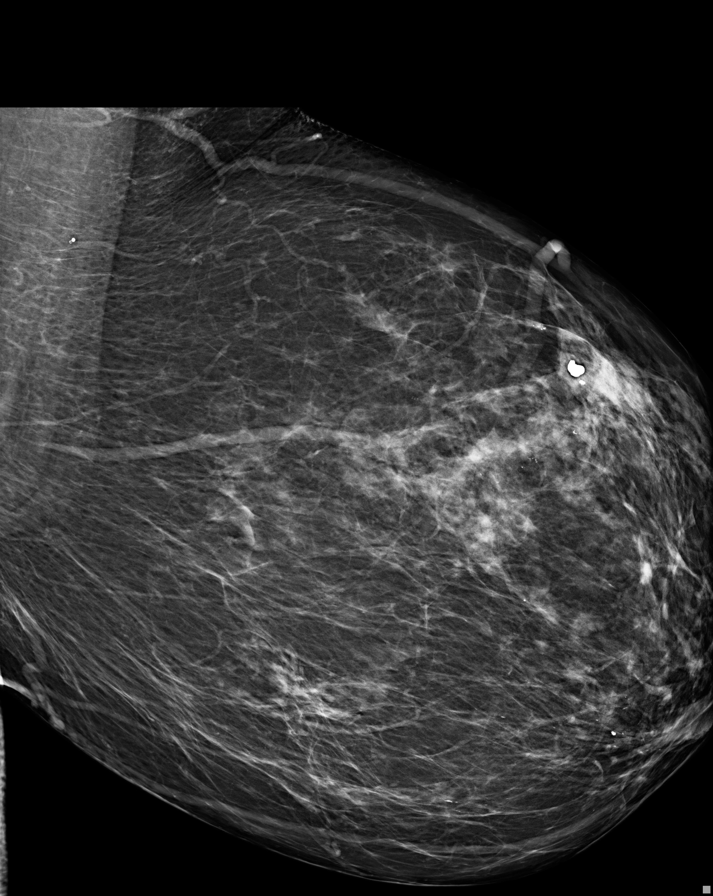
[im 6/8]
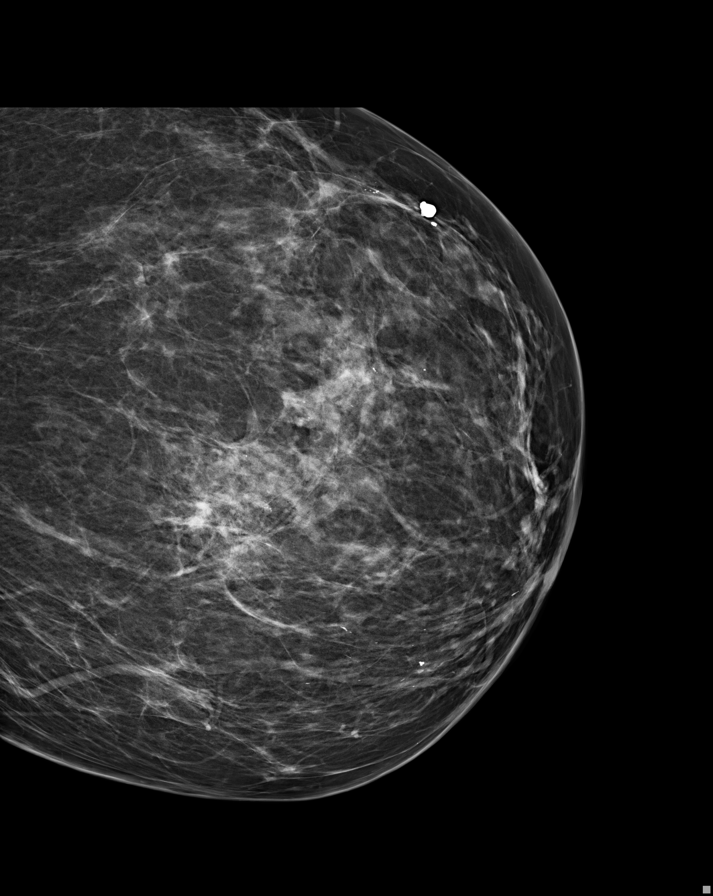
[im 7/8]
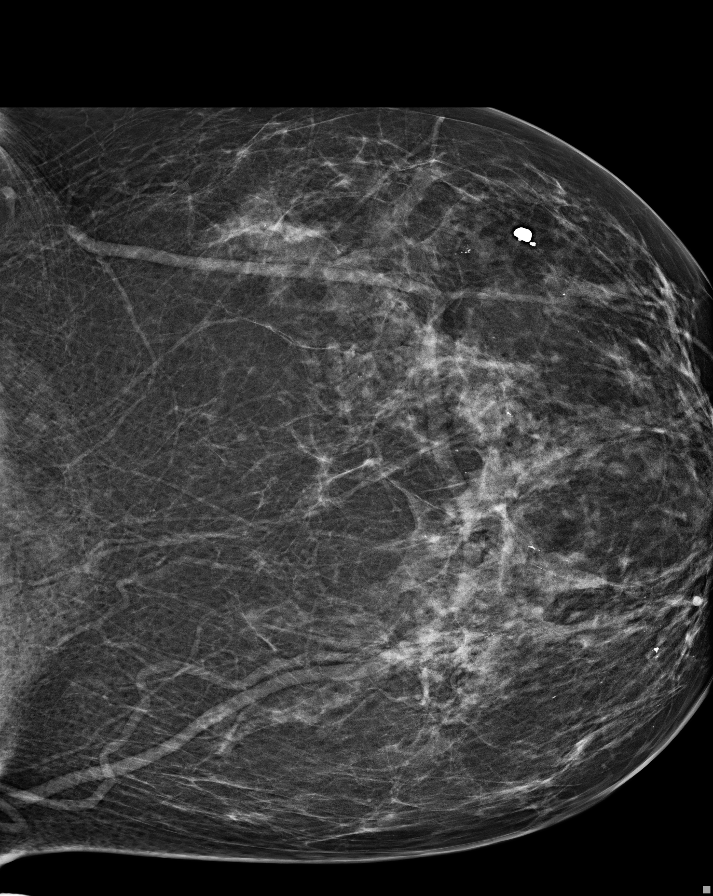
[im 8/8]
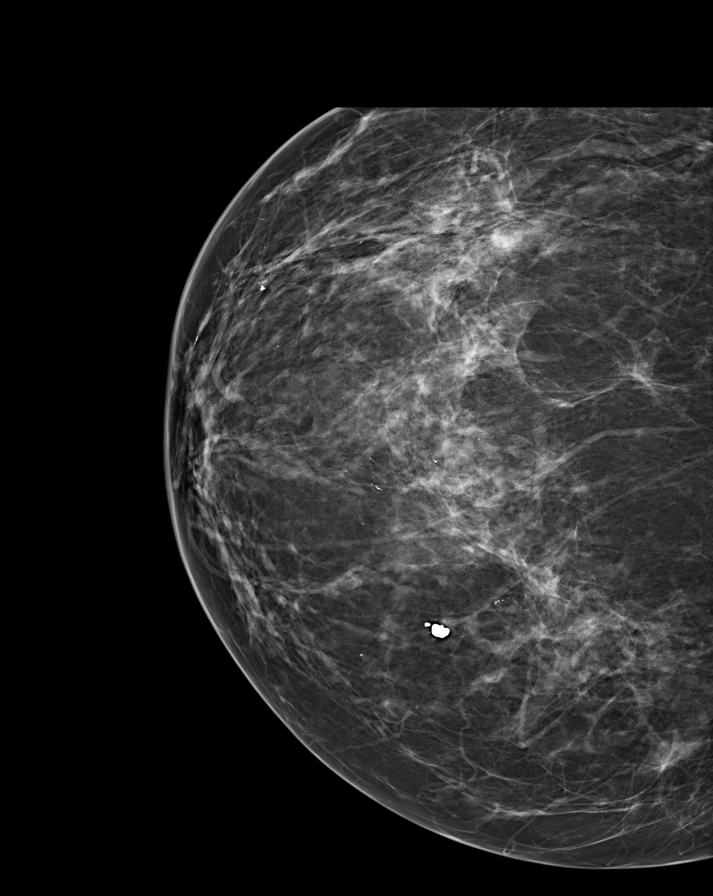

[8 of 8 positions shown; findings below may reference images not displayed]

EXAM

SCREENING MAMMOGRAM, BILATERAL

INDICATION

screening
SCREENING. KF 2D. PRIORS HERE [id number]  LIFETIME TC SCORE 6.9%

TECHNIQUE

Bilateral craniocaudal and medial lateral oblique views were generated and reviewed with computer-
aided detection.

COMPARISONS

April 04, 2018

FINDINGS

ACR Type 2:  25-50% There are scattered fibroglandular densities. Scattered coarse calcifications
are re-demonstrated. These have progressed minimally since prior exam. No concerning masses or
calcifications are seen.

IMPRESSION

No evidence for malignancy. One year follow-up is recommended.

BI-RADS 2, BENIGN.

Tech Notes:

## 2023-05-10 ENCOUNTER — Encounter: Admit: 2023-05-10 | Discharge: 2023-05-10 | Payer: PRIVATE HEALTH INSURANCE

## 2023-05-10 MED FILL — LISINOPRIL 40 MG PO TAB: 40 mg | ORAL | 90 days supply | Qty: 90 | Fill #4 | Status: AC

## 2023-05-21 ENCOUNTER — Encounter: Admit: 2023-05-21 | Discharge: 2023-05-21 | Payer: PRIVATE HEALTH INSURANCE

## 2023-05-24 ENCOUNTER — Encounter: Admit: 2023-05-24 | Discharge: 2023-05-24 | Payer: PRIVATE HEALTH INSURANCE

## 2023-05-24 MED FILL — CARVEDILOL 25 MG PO TAB: 25 mg | ORAL | 90 days supply | Qty: 180 | Fill #4 | Status: AC

## 2023-05-26 ENCOUNTER — Encounter: Admit: 2023-05-26 | Discharge: 2023-05-26 | Payer: PRIVATE HEALTH INSURANCE

## 2023-05-26 MED FILL — METFORMIN 500 MG PO TB24: 500 mg | ORAL | 90 days supply | Qty: 180 | Fill #4 | Status: AC

## 2023-05-28 ENCOUNTER — Encounter: Admit: 2023-05-28 | Discharge: 2023-05-28

## 2023-05-31 ENCOUNTER — Encounter: Admit: 2023-05-31 | Discharge: 2023-05-31

## 2023-05-31 MED ORDER — TACROLIMUS 0.75 MG PO TB24
.75 mg | ORAL_TABLET | Freq: Every day | ORAL | 0 refills | 30.00000 days | Status: AC
Start: 2023-05-31 — End: ?
  Filled 2023-06-02: qty 30, 30d supply, fill #1

## 2023-06-01 ENCOUNTER — Encounter: Admit: 2023-06-01 | Discharge: 2023-06-01

## 2023-06-02 ENCOUNTER — Encounter: Admit: 2023-06-02 | Discharge: 2023-06-02

## 2023-06-10 ENCOUNTER — Encounter: Admit: 2023-06-10 | Discharge: 2023-06-10 | Payer: PRIVATE HEALTH INSURANCE

## 2023-06-15 ENCOUNTER — Encounter: Admit: 2023-06-15 | Discharge: 2023-06-15 | Payer: PRIVATE HEALTH INSURANCE

## 2023-06-16 MED FILL — MYCOPHENOLATE SODIUM 180 MG PO TBEC: 180 mg | ORAL | 60 days supply | Qty: 240 | Fill #8 | Status: AC

## 2023-06-21 ENCOUNTER — Encounter: Admit: 2023-06-21 | Discharge: 2023-06-21

## 2023-06-24 ENCOUNTER — Encounter: Admit: 2023-06-24 | Discharge: 2023-06-24

## 2023-06-24 MED ORDER — EMPAGLIFLOZIN 10 MG PO TAB
10 mg | ORAL_TABLET | Freq: Every day | ORAL | 0 refills | Status: CN
Start: 2023-06-24 — End: ?

## 2023-06-24 MED ORDER — TACROLIMUS 0.75 MG PO TB24
.75 mg | ORAL_TABLET | Freq: Every day | ORAL | 0 refills | 30.00000 days | Status: AC
Start: 2023-06-24 — End: ?
  Filled 2023-06-30: qty 30, 30d supply, fill #1

## 2023-06-25 ENCOUNTER — Encounter: Admit: 2023-06-25 | Discharge: 2023-06-25

## 2023-06-28 ENCOUNTER — Encounter: Admit: 2023-06-28 | Discharge: 2023-06-28

## 2023-06-28 ENCOUNTER — Ambulatory Visit: Admit: 2023-06-28 | Discharge: 2023-06-29

## 2023-06-28 NOTE — Progress Notes
 Pharmacy Benefits Investigation    Medication name: tacrolimus XR (ENVARSUS XR) 0.75 mg ER tablet  Medication status: continuation (refill)  Medication regimen: Take one tablet by mouth daily.    The insurance requires a prior authorization for the medication. The prior authorization renewal was submitted via CoverMyMeds. Will follow up in 2 business days.    PA number: [id number]  Calvin Caulk  Specialty Pharmacy Patient Advocate

## 2023-06-29 ENCOUNTER — Encounter: Admit: 2023-06-29 | Discharge: 2023-06-29

## 2023-06-29 MED ORDER — ESTRADIOL 0.01 % (0.1 MG/GRAM) VA CREA
1 g | VAGINAL | 3 refills | 30.00000 days | Status: AC
Start: 2023-06-29 — End: ?
  Filled 2023-07-01: qty 42.5, 98d supply, fill #1

## 2023-06-30 ENCOUNTER — Encounter: Admit: 2023-06-30 | Discharge: 2023-06-30

## 2023-06-30 MED FILL — EMPAGLIFLOZIN 10 MG PO TAB: 10 mg | ORAL | 90 days supply | Qty: 90 | Fill #1 | Status: AC

## 2023-06-30 NOTE — Progress Notes
 Pharmacy Benefits Investigation    Medication name: empagliflozin (JARDIANCE) 10 mg tablet  Medication status: continuation (refill)    The insurance does not require a prior authorization for the medication.    The out of pocket cost today is $45 for 90 days. This cost may change due to factors including but not limited to changes in insurance coverage.    Copay assistance is not available. All available forms of copay assistance were evaluated and none are currently available for Michele Benitez.    Mozella K Hurn stated the copay is affordable. Per patient's request, the medication will be shipped to the patient's address.    Larita Pluck  Specialty Pharmacy Patient Advocate

## 2023-06-30 NOTE — Progress Notes
 Pharmacy Benefits Investigation    Medication name: tacrolimus XR (ENVARSUS XR) 0.75 mg ER tablet  Medication status: continuation (refill)  Medication regimen: Take one tablet by mouth daily.    The prior authorization renewal was reapproved for BRYLYN NOVAKOVICH (PA number [id number]) from 06/28/2023 through 06/27/2024.    The out of pocket cost today is $0 for 30 days. This cost may change due to factors including but not limited to changes in insurance coverage.    The copay is affordable. Per patient's request, the medication will be shipped to the patient's address.    Calvin Caulk  Specialty Pharmacy Patient Advocate

## 2023-07-05 ENCOUNTER — Encounter: Admit: 2023-07-05 | Discharge: 2023-07-05 | Payer: PRIVATE HEALTH INSURANCE

## 2023-07-19 ENCOUNTER — Encounter: Admit: 2023-07-19 | Discharge: 2023-07-19 | Payer: PRIVATE HEALTH INSURANCE

## 2023-07-21 ENCOUNTER — Encounter: Admit: 2023-07-21 | Discharge: 2023-07-21 | Payer: PRIVATE HEALTH INSURANCE

## 2023-07-22 ENCOUNTER — Encounter: Admit: 2023-07-22 | Discharge: 2023-07-22 | Payer: PRIVATE HEALTH INSURANCE

## 2023-07-22 MED FILL — ROSUVASTATIN 10 MG PO TAB: 10 mg | ORAL | 90 days supply | Qty: 90 | Fill #1 | Status: AC

## 2023-07-22 MED FILL — TACROLIMUS 0.75 MG PO TB24: 0.75 mg | ORAL | 30 days supply | Qty: 30 | Fill #1 | Status: AC

## 2023-07-24 ENCOUNTER — Encounter: Admit: 2023-07-24 | Discharge: 2023-07-24 | Payer: PRIVATE HEALTH INSURANCE

## 2023-08-02 ENCOUNTER — Encounter: Admit: 2023-08-02 | Discharge: 2023-08-02 | Payer: PRIVATE HEALTH INSURANCE

## 2023-08-02 MED FILL — LISINOPRIL 40 MG PO TAB: 40 mg | ORAL | 90 days supply | Qty: 90 | Fill #1 | Status: AC

## 2023-08-03 ENCOUNTER — Encounter: Admit: 2023-08-03 | Discharge: 2023-08-03 | Payer: PRIVATE HEALTH INSURANCE

## 2023-08-05 ENCOUNTER — Encounter: Admit: 2023-08-05 | Discharge: 2023-08-05 | Payer: PRIVATE HEALTH INSURANCE

## 2023-08-09 ENCOUNTER — Encounter: Admit: 2023-08-09 | Discharge: 2023-08-09 | Payer: PRIVATE HEALTH INSURANCE

## 2023-08-10 ENCOUNTER — Encounter: Admit: 2023-08-10 | Discharge: 2023-08-10 | Payer: PRIVATE HEALTH INSURANCE

## 2023-08-10 MED FILL — MYCOPHENOLATE SODIUM 180 MG PO TBEC: 180 mg | ORAL | 60 days supply | Qty: 240 | Fill #1 | Status: AC

## 2023-08-10 MED FILL — CARVEDILOL 25 MG PO TAB: 25 mg | ORAL | 90 days supply | Qty: 180 | Fill #1 | Status: AC

## 2023-08-13 ENCOUNTER — Encounter: Admit: 2023-08-13 | Discharge: 2023-08-13 | Payer: PRIVATE HEALTH INSURANCE

## 2023-08-19 ENCOUNTER — Encounter: Admit: 2023-08-19 | Discharge: 2023-08-19 | Payer: PRIVATE HEALTH INSURANCE

## 2023-08-19 MED FILL — METFORMIN 500 MG PO TB24: 500 mg | ORAL | 90 days supply | Qty: 180 | Fill #1 | Status: AC

## 2023-08-20 ENCOUNTER — Encounter: Admit: 2023-08-20 | Discharge: 2023-08-20 | Payer: PRIVATE HEALTH INSURANCE

## 2023-08-21 ENCOUNTER — Encounter: Admit: 2023-08-21 | Discharge: 2023-08-21 | Payer: PRIVATE HEALTH INSURANCE

## 2023-08-22 MED FILL — TACROLIMUS 0.75 MG PO TB24: 0.75 mg | ORAL | 30 days supply | Qty: 30 | Fill #2 | Status: AC

## 2023-09-09 ENCOUNTER — Encounter: Admit: 2023-09-09 | Discharge: 2023-09-09 | Payer: PRIVATE HEALTH INSURANCE

## 2023-09-13 ENCOUNTER — Encounter: Admit: 2023-09-13 | Discharge: 2023-09-13 | Payer: PRIVATE HEALTH INSURANCE

## 2023-09-14 ENCOUNTER — Encounter: Admit: 2023-09-14 | Discharge: 2023-09-14 | Payer: PRIVATE HEALTH INSURANCE

## 2023-09-15 MED FILL — TACROLIMUS 0.75 MG PO TB24: 0.75 mg | ORAL | 30 days supply | Qty: 30 | Fill #2 | Status: AC

## 2023-09-27 ENCOUNTER — Encounter: Admit: 2023-09-27 | Discharge: 2023-09-27 | Payer: PRIVATE HEALTH INSURANCE

## 2023-09-28 ENCOUNTER — Encounter: Admit: 2023-09-28 | Discharge: 2023-09-28 | Payer: PRIVATE HEALTH INSURANCE

## 2023-09-28 ENCOUNTER — Ambulatory Visit: Admit: 2023-09-28 | Discharge: 2023-09-29 | Payer: PRIVATE HEALTH INSURANCE

## 2023-09-28 MED ORDER — JARDIANCE 10 MG PO TAB
10 mg | ORAL_TABLET | Freq: Every day | ORAL | 0 refills | Status: CN
Start: 2023-09-28 — End: ?

## 2023-09-28 MED ORDER — ESTRADIOL 10 MCG VA TAB
10 ug | ORAL_TABLET | VAGINAL | 3 refills | 30.00000 days | Status: AC
Start: 2023-09-28 — End: ?
  Filled 2023-10-05: qty 18, 28d supply, fill #0

## 2023-09-28 NOTE — Procedures
 Procedure: Cystoscopy    Anesthesia: Local    Findings:   1. Normal urethra  2. Normal bladder without tumors, stones, foreign bodies or suspicious bladder mucosa.  3. Orthotopic ureteral orifices    Operative Procedure:  Following informed consent the patient was brought to the procedure room. The patient was positioned in the dorsal lithotomy position. The patient was prepped and draped in the usual sterile fashion.  Intraurethral lidocaine jelly was administered.    The flexible cystoscope was inserted into the urethra and passed into the bladder under direct vision. A full survey of the bladder was performed.  The scope was retroflexed to visualize the bladder neck area.  The scope was withdrawn and the urethra was inspected as it was removed.   Findings were as noted above.    The patient tolerated the procedure well.  There were no complications.  Routine post-procedure nursing evaluation was performed.    Wallace Going, PA-C

## 2023-09-28 NOTE — Progress Notes
 Date of Service: 09/28/2023     Subjective:             Michele Benitez is a 63 y.o. female     History of Present Illness  Michele Benitez is a 63 y.o. 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       06/2023- Leak w/ positional change. 1 ppd - small amount. No UI. Frequency x5-6. No nocturia. Has tried kegels but stopped d/t cramping in vaginal area. Has sensation of vaginal bulge. No vaginal irritation or bleeding.   P: PFPT, TVE    Persistent microscopic hematuria. No previous tobacco use.    P: cystoscopy and CTU      Today, 09/28/23- she presents for cystoscopy. She did not get CTU done. Reports vaginal bleeding w/ vaginal estrace  so stopped applying. No longer having vaginal bleeding.                      Past Medical History:    Abnormal stress test    Dyslipidemia    ESRD (end stage renal disease) (CMS-HCC)    Hyperlipemia    Hyperphosphatemia    Hypertension    Kidney transplanted    PTDM (post-transplant diabetes mellitus) (CMS-HCC)       Surgical History:   Procedure Laterality Date    Left Heart Catheterization With Ventriculogram Left 12/03/2015    Performed by Charlanne Francois, MD at [facility] CATH LAB    Coronary Angiography N/A 12/03/2015    Performed by Gupta, Kamal, MD at [facility] CATH LAB    Possible Percutaneous Coronary Intervention N/A 12/03/2015    Performed by Gupta, Kamal, MD at [facility] CATH LAB    ALLOTRANSPLANTATION KIDNEY FROM NON LIVING DONOR WITHOUT RECIPIENT NEPHRECTOMY N/A 09/01/2018    Performed by Lorrene Dorsie HERO, MD at [facility] OR       Family History   Problem Relation Name Age of Onset    Heart Failure Mother      Alcohol abuse Mother      Cancer Father          colon       Current Outpatient Medications   Medication Sig Dispense Refill    aspirin  EC 81 mg tablet Take one tablet by mouth daily. 90 tablet 3    B comp no3-folic-C-biotin-zinc (NEPHPLEX RX) 1-60-300-12.5 mg-mg-mcg-mg tablet Take one tablet by mouth daily.      carvediloL  (COREG ) 25 mg tablet Take one tablet by mouth twice daily. 180 tablet 3    empagliflozin  (JARDIANCE ) 10 mg tablet Take one tablet by mouth daily. 90 tablet 3    [START ON 09/30/2023] estradioL  (VAGIFEM ) 10 mcg vaginal tablet Insert or Apply one tablet to vaginal area twice weekly. Insert one tablet vaginally daily for two weeks; then insert one tablet twice weekly. 36 tablet 3    famotidine  (PEPCID ) 20 mg tablet Take one tablet by mouth daily. 90 tablet 3    gabapentin  (NEURONTIN ) 300 mg capsule Take one capsule by mouth at bedtime daily. 90 capsule 3    lisinopriL  (ZESTRIL ) 40 mg tablet Take one tablet by mouth at bedtime daily. 90 tablet 3    metFORMIN -XR (GLUCOPHAGE  XR) 500 mg extended release tablet Take two tablets by mouth daily with dinner. 180 tablet 3    multivitamin (MULTIPLE VITAMIN-MINERALS) tablet  Take one tablet by mouth daily. 90 tablet 3    mycophenolate  sodium (MYFORTIC ) 180 mg tablet,delayed release Take two tablets by mouth twice daily. 120 tablet 11    ondansetron  (ZOFRAN  ODT) 4 mg rapid dissolve tablet Dissolve one tablet by mouth every 8 hours as needed for Nausea or Vomiting. Place on tongue to disolve. 30 tablet 0    rosuvastatin  (CRESTOR ) 10 mg tablet Take one tablet by mouth at bedtime daily. 90 tablet 3    sodium bicarbonate  650 mg tablet Take one tablet by mouth twice daily.      tacrolimus  XR (ENVARSUS  XR) 0.75 mg ER tablet Take one tablet by mouth daily. NEEDS TO MAKE AN APPT FOR FUTURE REFILLS 30 tablet 11     No current facility-administered medications for this visit.       Allergies   Allergen Reactions    Fish Oil RASH    Lipitor [Atorvastatin ] DIARRHEA       Social History     Socioeconomic History    Marital status: Married    Number of children: 2   Tobacco Use    Smoking status: Never    Smokeless tobacco: Never   Vaping Use    Vaping status: Never Used Substance and Sexual Activity    Alcohol use: No    Drug use: No         Objective:          aspirin  EC 81 mg tablet Take one tablet by mouth daily.    B comp no3-folic-C-biotin-zinc (NEPHPLEX RX) 1-60-300-12.5 mg-mg-mcg-mg tablet Take one tablet by mouth daily.    carvediloL  (COREG ) 25 mg tablet Take one tablet by mouth twice daily.    empagliflozin  (JARDIANCE ) 10 mg tablet Take one tablet by mouth daily.    [START ON 09/30/2023] estradioL  (VAGIFEM ) 10 mcg vaginal tablet Insert or Apply one tablet to vaginal area twice weekly. Insert one tablet vaginally daily for two weeks; then insert one tablet twice weekly.    famotidine  (PEPCID ) 20 mg tablet Take one tablet by mouth daily.    gabapentin  (NEURONTIN ) 300 mg capsule Take one capsule by mouth at bedtime daily.    lisinopriL  (ZESTRIL ) 40 mg tablet Take one tablet by mouth at bedtime daily.    metFORMIN -XR (GLUCOPHAGE  XR) 500 mg extended release tablet Take two tablets by mouth daily with dinner.    multivitamin (MULTIPLE VITAMIN-MINERALS) tablet Take one tablet by mouth daily.    mycophenolate  sodium (MYFORTIC ) 180 mg tablet,delayed release Take two tablets by mouth twice daily.    ondansetron  (ZOFRAN  ODT) 4 mg rapid dissolve tablet Dissolve one tablet by mouth every 8 hours as needed for Nausea or Vomiting. Place on tongue to disolve.    rosuvastatin  (CRESTOR ) 10 mg tablet Take one tablet by mouth at bedtime daily.    sodium bicarbonate  650 mg tablet Take one tablet by mouth twice daily.    tacrolimus  XR (ENVARSUS  XR) 0.75 mg ER tablet Take one tablet by mouth daily. NEEDS TO MAKE AN APPT FOR FUTURE REFILLS     Vitals:    09/28/23 1015   PainSc: Zero     There is no height or weight on file to calculate BMI.     Gen: well-developed, NAD  HEENT: normocephalic, conjunctiva clear, mucous membranes moist  Respiratory: normal respiratory effort with symmetric chest expansion  CV: Palpable radial pulse, no cyanosis  GI: Soft, non tender, non distended, no palpable masses  Skin: Warm, No jaundice  Musculoskeletal: No  edema, no gross deformity  Neuro: Awake, alert, no focal deficits, normal gait  Psychiatric: Normal affect                         Assessment and Plan:  63 y.o. female w/ microscopic hematuria, SUI and GSM    Microscopic hematuria  - Next avail CTU. Will contact w/ results.     SUI and GSM  - Discontinue vaginal estrace . Start vagifem .     F/u in 6 months                                     Jacquline JULIANNA Barrio, PA-C  Urology      Orders Placed This Encounter    estradioL  (VAGIFEM ) 10 mcg vaginal tablet

## 2023-09-29 ENCOUNTER — Encounter: Admit: 2023-09-29 | Discharge: 2023-09-29 | Payer: PRIVATE HEALTH INSURANCE

## 2023-09-29 DIAGNOSIS — R3129 Other microscopic hematuria: Principal | ICD-10-CM

## 2023-09-30 ENCOUNTER — Encounter: Admit: 2023-09-30 | Discharge: 2023-09-30 | Payer: PRIVATE HEALTH INSURANCE

## 2023-10-01 ENCOUNTER — Encounter: Admit: 2023-10-01 | Discharge: 2023-10-01 | Payer: PRIVATE HEALTH INSURANCE

## 2023-10-01 NOTE — Progress Notes
 Contacted Michele Benitez to refill their medication(s) MYCOPHENOLATE  SODIUM 180 MG PO TBEC.    Patient declined a refill because they have enough medication on hand. They estimated to have supply through 10/22/2023. The patient requested a call back on 10/15/2023.    Tylene Casa  [facility]  [phone]

## 2023-10-04 ENCOUNTER — Encounter: Admit: 2023-10-04 | Discharge: 2023-10-04 | Payer: PRIVATE HEALTH INSURANCE

## 2023-10-05 ENCOUNTER — Encounter: Admit: 2023-10-05 | Discharge: 2023-10-05 | Payer: PRIVATE HEALTH INSURANCE

## 2023-10-05 MED FILL — EMPAGLIFLOZIN 10 MG PO TAB: 10 mg | ORAL | 90 days supply | Qty: 90 | Fill #0 | Status: AC

## 2023-10-07 ENCOUNTER — Encounter: Admit: 2023-10-07 | Discharge: 2023-10-07 | Payer: PRIVATE HEALTH INSURANCE

## 2023-10-08 ENCOUNTER — Encounter: Admit: 2023-10-08 | Discharge: 2023-10-08 | Payer: PRIVATE HEALTH INSURANCE

## 2023-10-08 MED FILL — MYCOPHENOLATE SODIUM 180 MG PO TBEC: 180 mg | ORAL | 60 days supply | Qty: 240 | Fill #1 | Status: AC

## 2023-10-08 MED FILL — TACROLIMUS 0.75 MG PO TB24: 0.75 mg | ORAL | 30 days supply | Qty: 30 | Fill #3 | Status: AC

## 2023-10-13 ENCOUNTER — Encounter: Admit: 2023-10-13 | Discharge: 2023-10-13 | Payer: PRIVATE HEALTH INSURANCE

## 2023-10-13 MED FILL — ROSUVASTATIN 10 MG PO TAB: 10 mg | ORAL | 90 days supply | Qty: 90 | Fill #1 | Status: AC

## 2023-10-13 NOTE — Telephone Encounter
 No change to plan.  Will stop Vagifem  suppositories and can use OTC coconut oil.

## 2023-10-13 NOTE — Telephone Encounter
 Patient called to report that after use of Vagifem  suppositories she developed vaginal bleeding/spotting.  She trial this medication for one week, three doses and continued to have vaginal bleeding/spotting. She has stopped this medication.  She previously had the same bleeding side effect from Estrace  vaginal cream.  Plan to stop using suppositories.  Patient can use OTC Coconut oil for vaginal dryness w/o side effect of bleeding.  Will discuss further w/ provider and if additional recommendations are made, will call follow up with patient.

## 2023-10-19 ENCOUNTER — Ambulatory Visit: Admit: 2023-10-19 | Discharge: 2023-10-19 | Payer: PRIVATE HEALTH INSURANCE

## 2023-10-19 ENCOUNTER — Encounter: Admit: 2023-10-19 | Discharge: 2023-10-19 | Payer: PRIVATE HEALTH INSURANCE

## 2023-10-19 NOTE — Telephone Encounter
 Patient states she had been taking coconut oil pills and it was turning her urine orange.  Patient called and notified that Jacquline recommends coconut oil that comes as solid that you place in the vagina.  This will help with vaginal dryness since patient is unable to take Estrogen cream.  Patient can buy this OTC.  No further questions/concerns.

## 2023-10-21 ENCOUNTER — Encounter: Admit: 2023-10-21 | Discharge: 2023-10-21 | Payer: PRIVATE HEALTH INSURANCE

## 2023-10-21 NOTE — Telephone Encounter
 Discussed CT Scan results with patient.  Patient given Jacquline Barrio PA-C recommendations to have her PCP review CT and determine further work up .  Patient verbalized understanding and is going to follow up with her PCP.

## 2023-10-27 ENCOUNTER — Encounter: Admit: 2023-10-27 | Discharge: 2023-10-27 | Payer: PRIVATE HEALTH INSURANCE

## 2023-10-29 ENCOUNTER — Encounter: Admit: 2023-10-29 | Discharge: 2023-10-29 | Payer: PRIVATE HEALTH INSURANCE

## 2023-11-01 ENCOUNTER — Encounter: Admit: 2023-11-01 | Discharge: 2023-11-01 | Payer: PRIVATE HEALTH INSURANCE

## 2023-11-01 MED FILL — LISINOPRIL 40 MG PO TAB: 40 mg | ORAL | 90 days supply | Qty: 90 | Fill #1 | Status: AC

## 2023-11-01 MED FILL — CARVEDILOL 25 MG PO TAB: 25 mg | ORAL | 90 days supply | Qty: 180 | Fill #1 | Status: AC

## 2023-11-01 MED FILL — TACROLIMUS 0.75 MG PO TB24: 0.75 mg | ORAL | 30 days supply | Qty: 30 | Fill #4 | Status: AC

## 2023-11-15 ENCOUNTER — Encounter: Admit: 2023-11-15 | Discharge: 2023-11-15 | Payer: PRIVATE HEALTH INSURANCE

## 2023-11-23 ENCOUNTER — Encounter: Admit: 2023-11-23 | Discharge: 2023-11-23 | Payer: PRIVATE HEALTH INSURANCE

## 2023-11-23 MED FILL — METFORMIN 500 MG PO TB24: 500 mg | ORAL | 90 days supply | Qty: 180 | Fill #1 | Status: AC

## 2023-11-24 ENCOUNTER — Encounter: Admit: 2023-11-24 | Discharge: 2023-11-24 | Payer: PRIVATE HEALTH INSURANCE

## 2023-11-25 ENCOUNTER — Encounter: Admit: 2023-11-25 | Discharge: 2023-11-25 | Payer: PRIVATE HEALTH INSURANCE

## 2023-11-30 ENCOUNTER — Encounter: Admit: 2023-11-30 | Discharge: 2023-11-30 | Payer: PRIVATE HEALTH INSURANCE

## 2023-12-01 ENCOUNTER — Encounter: Admit: 2023-12-01 | Discharge: 2023-12-01 | Payer: PRIVATE HEALTH INSURANCE

## 2023-12-01 MED FILL — TACROLIMUS 0.75 MG PO TB24: 0.75 mg | ORAL | 30 days supply | Qty: 30 | Fill #5 | Status: AC

## 2023-12-01 MED FILL — MYCOPHENOLATE SODIUM 180 MG PO TBEC: 180 mg | ORAL | 60 days supply | Qty: 240 | Fill #2 | Status: AC

## 2023-12-13 ENCOUNTER — Encounter: Admit: 2023-12-13 | Discharge: 2023-12-13 | Payer: PRIVATE HEALTH INSURANCE

## 2023-12-17 ENCOUNTER — Encounter: Admit: 2023-12-17 | Discharge: 2023-12-17 | Payer: PRIVATE HEALTH INSURANCE

## 2023-12-17 DIAGNOSIS — E785 Hyperlipidemia, unspecified: Principal | ICD-10-CM

## 2023-12-17 DIAGNOSIS — R739 Hyperglycemia, unspecified: Secondary | ICD-10-CM

## 2023-12-17 DIAGNOSIS — Z940 Kidney transplant status: Secondary | ICD-10-CM

## 2023-12-17 DIAGNOSIS — N186 End stage renal disease: Secondary | ICD-10-CM

## 2023-12-17 NOTE — Telephone Encounter
 Lab orders faxed to [facility], fax # [phone]

## 2023-12-17 NOTE — Telephone Encounter
 Michele Benitez w/Michele Benitez called w/pt in office stating that their orders are expired and requesting that new orders be faxed to [phone]. Madison RN volunteered to send them over. Informed Michele Benitez.

## 2023-12-20 ENCOUNTER — Encounter: Admit: 2023-12-20 | Discharge: 2023-12-20 | Payer: PRIVATE HEALTH INSURANCE

## 2023-12-20 NOTE — Telephone Encounter
 Patient denies current vaginal bleeding, but reports history of vaginal bleeding in July that subsided after stopping Vagifem  suppositories that she was using for vaginal dryness . Some information related to this are available in O2 under telephone encounters with [facility].   I informed patient that we received records from [facility] including pelvic MRI and US  showing the uterine mass. RN offered patient a sooner appointment in October, but patient requests an appointment on either Monday or Tuesday which are husband days off. RN offered patient next available Monday on 02/07/24 and she is agreeable to this. Patient then asks to confirm she is having surgery on that day as she was previously told (unsure by who) that her original appointment on 04/04/24 was to have surgery with Dr. Darra. RN explained to the patient that the appointment we are currently scheduling is just a consultation with a gynecologist to review pelvic imaging findings, most likely offer to complete a pelvic exam, discuss any associated symptoms, and assess the need for surgery if indicated. Patient was explained that this was just a clinic appointment. Patient verbalized understanding. Appointment rescheduled and clinic location provided. Albino Laurann Garre, RN    Future Appointments   Date Time Provider Department Center   02/07/2024  3:15 PM Elnor Hoy POUR, MD [facility] OB[facility]N   04/03/2024  1:00 PM Kayla Jacquline JULIANNA DEVONNA [facility]

## 2023-12-23 ENCOUNTER — Encounter: Admit: 2023-12-23 | Discharge: 2023-12-23 | Payer: PRIVATE HEALTH INSURANCE

## 2023-12-23 MED FILL — TACROLIMUS 0.75 MG PO TB24: 0.75 mg | ORAL | 30 days supply | Qty: 30 | Fill #6 | Status: AC

## 2023-12-31 ENCOUNTER — Encounter: Admit: 2023-12-31 | Discharge: 2023-12-31 | Payer: PRIVATE HEALTH INSURANCE

## 2024-01-01 MED FILL — EMPAGLIFLOZIN 10 MG PO TAB: 10 mg | ORAL | 90 days supply | Qty: 90 | Fill #1 | Status: AC

## 2024-01-10 ENCOUNTER — Encounter: Admit: 2024-01-10 | Discharge: 2024-01-10 | Payer: PRIVATE HEALTH INSURANCE

## 2024-01-10 MED FILL — ROSUVASTATIN 10 MG PO TAB: 10 mg | ORAL | 90 days supply | Qty: 90 | Fill #2 | Status: AC

## 2024-01-11 ENCOUNTER — Encounter: Admit: 2024-01-11 | Discharge: 2024-01-11 | Payer: PRIVATE HEALTH INSURANCE

## 2024-01-14 ENCOUNTER — Encounter: Admit: 2024-01-14 | Discharge: 2024-01-14 | Payer: PRIVATE HEALTH INSURANCE

## 2024-01-17 ENCOUNTER — Encounter: Admit: 2024-01-17 | Discharge: 2024-01-17 | Payer: PRIVATE HEALTH INSURANCE

## 2024-01-18 ENCOUNTER — Encounter: Admit: 2024-01-18 | Discharge: 2024-01-18 | Payer: PRIVATE HEALTH INSURANCE

## 2024-01-18 MED FILL — TACROLIMUS 0.75 MG PO TB24: 0.75 mg | ORAL | 30 days supply | Qty: 30 | Fill #7 | Status: AC

## 2024-01-19 ENCOUNTER — Encounter: Admit: 2024-01-19 | Discharge: 2024-01-19 | Payer: PRIVATE HEALTH INSURANCE

## 2024-01-20 ENCOUNTER — Encounter: Admit: 2024-01-20 | Discharge: 2024-01-20 | Payer: PRIVATE HEALTH INSURANCE

## 2024-01-21 ENCOUNTER — Encounter: Admit: 2024-01-21 | Discharge: 2024-01-21 | Payer: PRIVATE HEALTH INSURANCE

## 2024-01-24 ENCOUNTER — Encounter: Admit: 2024-01-24 | Discharge: 2024-01-24 | Payer: PRIVATE HEALTH INSURANCE

## 2024-01-25 MED FILL — MYCOPHENOLATE SODIUM 180 MG PO TBEC: 180 mg | ORAL | 60 days supply | Qty: 240 | Fill #3 | Status: AC

## 2024-01-31 ENCOUNTER — Encounter: Admit: 2024-01-31 | Discharge: 2024-01-31 | Payer: PRIVATE HEALTH INSURANCE

## 2024-01-31 MED FILL — CARVEDILOL 25 MG PO TAB: 25 mg | ORAL | 90 days supply | Qty: 180 | Fill #2 | Status: AC

## 2024-01-31 MED FILL — LISINOPRIL 40 MG PO TAB: 40 mg | ORAL | 90 days supply | Qty: 90 | Fill #2 | Status: AC

## 2024-02-04 ENCOUNTER — Encounter: Admit: 2024-02-04 | Discharge: 2024-02-04 | Payer: PRIVATE HEALTH INSURANCE

## 2024-02-07 ENCOUNTER — Encounter: Admit: 2024-02-07 | Discharge: 2024-02-07 | Payer: PRIVATE HEALTH INSURANCE

## 2024-02-07 ENCOUNTER — Ambulatory Visit: Admit: 2024-02-07 | Discharge: 2024-02-08 | Payer: PRIVATE HEALTH INSURANCE

## 2024-02-07 VITALS — BP 133/77 | HR 77 | Temp 98.00000°F | Ht 66.0 in | Wt 194.4 lb

## 2024-02-07 DIAGNOSIS — Z124 Encounter for screening for malignant neoplasm of cervix: Secondary | ICD-10-CM

## 2024-02-07 DIAGNOSIS — N858 Other specified noninflammatory disorders of uterus: Principal | ICD-10-CM

## 2024-02-07 DIAGNOSIS — Z940 Kidney transplant status: Secondary | ICD-10-CM

## 2024-02-07 DIAGNOSIS — N189 Chronic kidney disease, unspecified: Secondary | ICD-10-CM

## 2024-02-07 NOTE — Progress Notes [1]
 Date of Service: 02/07/2024    Michele Benitez is a 63 y.o. female.  DOB: 1960-05-25  MRN: [id number]      Subjective:    New patient here to evaluate uterine mass    Was having urinary incontinence. Imaging performed showed uterine mass, likely submucosal fibroid on subsequent MRI.   Received records from [facility] including pelvic MRI (12/06/23), pelvic ultrasound (11/24/23), and office visit notes. Records moved to the ready to scan folder. Albino Laurann Garre, RN                       Patient denies current vaginal bleeding, but reports history of vaginal bleeding in July that subsided after stopping Vagifem  suppositories that she was using for vaginal dryness . Some information related to this are available in O2 under telephone encounters with [facility].   I informed patient that we received records from [facility] including pelvic MRI and US  showing the uterine mass.     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    History of Present Illness  Michele Benitez is a 63 year old female with a history of kidney transplant who presents for follow-up of a uterine mass.    Uterine mass  - Incidentally discovered on imaging in September 2025 during evaluation for urinary incontinence  - No recent vaginal bleeding  - No significant abdominal discomfort  - Occasional mild abdominal 'twinges' that resolve spontaneously without medication or rest    Urinary incontinence  - Episodes of urinary incontinence prompted initial imaging  - Symptoms have improved  - Continues to wear a pad for security  - Consulted a urologist; CT scan performed with no follow-up  - No bladder pathology identified on imaging    Gynecologic history  - History of two C-sections with vertical midline incisions, first for preeclampsia  - Two children and three miscarriages between pregnancies  - Heavy menstrual periods since menarche at age 81, described as severe and requiring bed rest  - No family history of breast or gynecological cancers  - Regular Pap smears in the past, believed to be normal, but records unavailable    Hormone therapy and vaginal bleeding  - Previously experienced vaginal bleeding while on estrogen suppositories  - Bleeding resolved after switching to oral estrogen and subsequently discontinuing all estrogen therapy  - Unable to recall when estrogen therapy was discontinued    Renal transplant and immunosuppression  - Kidney transplant in 2020 for [facility] renal disease  - On mycophenolate  and tacrolimus   - History of BK viremia  - Immunocompromised status  - Four years of dialysis prior to transplant  - Dialysis fistula remains in place as a precaution    Metabolic disease  - Prediabetes managed with metformin     P2032  C-sec x 2 via vertical midline     Past Medical History:    Abnormal stress test    Dyslipidemia    ESRD (end stage renal disease) (CMS-HCC)    Hyperlipemia    Hyperphosphatemia    Hypertension    Kidney transplanted    PTDM (post-transplant diabetes mellitus) (CMS-HCC)       Surgical History:   Procedure Laterality Date    Left Heart Catheterization With Ventriculogram Left 12/03/2015    Performed by Charlanne Francois, MD at [facility] CATH LAB    Coronary Angiography N/A 12/03/2015  Performed by Gupta, Kamal, [facility] at [facility] CATH LAB    Possible Percutaneous Coronary Intervention N/A 12/03/2015    Performed by Gupta, Kamal, MD at [facility] CATH LAB    ALLOTRANSPLANTATION KIDNEY FROM NON LIVING DONOR WITHOUT RECIPIENT NEPHRECTOMY N/A 09/01/2018    Performed by Lorrene Dorsie HERO, MD at [facility] OR    CESAREAN SECTION      x2       OB History   Gravida Para Term Preterm AB Living   5 2 2  0 3 2   SAB IAB Ectopic Multiple Live Births   0 0 0 0 0       Social History     Tobacco Use    Smoking status: Never    Smokeless tobacco: Never   Substance Use Topics    Alcohol use: No       Family History   Problem Relation Name Age of Onset    Heart Failure Mother      Alcohol abuse Mother      Cancer Father          colon       Allergies:  Allergies[1]    Review of Systems     Objective:          aspirin  EC 81 mg tablet Take one tablet by mouth daily.    B comp no3-folic-C-biotin-zinc (NEPHPLEX RX) 1-60-300-12.5 mg-mg-mcg-mg tablet Take one tablet by mouth daily.    carvediloL  (COREG ) 25 mg tablet Take one tablet by mouth twice daily.    empagliflozin  (JARDIANCE ) 10 mg tablet Take one tablet by mouth daily.    estradioL  (VAGIFEM ) 10 mcg vaginal tablet Insert or Apply one tablet to vaginal area twice weekly. Insert one tablet vaginally daily for two weeks; then insert one tablet twice weekly.    famotidine  (PEPCID ) 20 mg tablet Take one tablet by mouth daily.    gabapentin  (NEURONTIN ) 300 mg capsule Take one capsule by mouth at bedtime daily.    lisinopriL  (ZESTRIL ) 40 mg tablet Take one tablet by mouth at bedtime daily.    metFORMIN -XR (GLUCOPHAGE  XR) 500 mg extended release tablet Take two tablets by mouth daily with dinner.    multivitamin (MULTIPLE VITAMIN-MINERALS) tablet Take one tablet by mouth daily.    mycophenolate  sodium (MYFORTIC ) 180 mg tablet,delayed release Take two tablets by mouth twice daily.    ondansetron  (ZOFRAN  ODT) 4 mg rapid dissolve tablet Dissolve one tablet by mouth every 8 hours as needed for Nausea or Vomiting. Place on tongue to disolve.    rosuvastatin  (CRESTOR ) 10 mg tablet Take one tablet by mouth at bedtime daily.    sodium bicarbonate  650 mg tablet Take one tablet by mouth twice daily.    tacrolimus  XR (ENVARSUS  XR) 0.75 mg ER tablet Take one tablet by mouth daily. NEEDS TO MAKE AN APPT FOR FUTURE REFILLS       Vitals:    02/07/24 1520   BP: 133/77   Pulse: 77   Temp: 36.7 ?C (98 ?F)   TempSrc: Oral   PainSc: Zero   Weight: 88.2 kg (194 lb 6.4 oz)   Height: 167.6 cm (5' 6)       Body mass index is 31.38 kg/m?SABRA     Physical Exam  GENITOURINARY: Pelvic exam: exam chaperoned by nurse, normal vagina and vulva with atrophy, normal cervix without lesions, polyps or tenderness, uterus normal size, shape, consistency, no mass or tenderness, adnexa normal in size without mass or tenderness.  Endometrial Biopsy    Date/Time: 02/07/2024 3:15 PM    Performed by: Elnor Hoy POUR, MD  Authorized by: Elnor Hoy POUR, MD    Consent:     Consent obtained: written    Consent given by: patient    Risks discussed: bleeding, infection and pain  Procedure:     Tenaculum used: yes    Findings:     Cervix: normal      Uterus depth by sound (cm): 7    Specimen collected: specimen collected and sent to pathology      Patient tolerance: tolerated with difficulty         Assessment and Plan:  Assessment & Plan  Was having urinary incontinence. Imaging performed showed uterine mass, likely submucosal fibroid on subsequent MRI.       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 MRI suggests benign nature.     Counseled patient on uterine fibroids.   Explained that uterine fibroids are benign (not cancer) growths that develop from the muscle tissue of the uterus. They also are called leiomyomas or myomas. The size, shape, and location of fibroids can vary greatly. A fibroid may remain very small for a long time and suddenly grow rapidly, or grow slowly over a number of years.   Discussed that fibroids can be monitored withOUT treatment unless they are causing symptoms such as bleeding, pelvic pressure/bulk symptoms, or infertility.    - Performed endometrial biopsy to rule out abnormal cells.  - Recommend observation/expectant management as patient is asymptomatic from this fibroid and it has likely been present for years (although CT from 2015 does not mention this fibroid, no recent ultrasound)  - Scheduled follow-up ultrasound in six months to monitor fibroid size and changes.  - Advised to report any new or worsening symptoms, such as increased pain or vaginal discharge or bleeding.    Cervical cancer screening (Pap smear)  Due for routine Pap smear. Previous results reportedly normal.  - Performed Pap smear today.  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 Chronic kidney disease well-managed. On immunosuppressive therapy.  - Continue current immunosuppressive regimen.                        [1]   Allergies  Allergen Reactions    Fish Oil RASH    Lipitor [Atorvastatin ] DIARRHEA

## 2024-02-08 DIAGNOSIS — D250 Submucous leiomyoma of uterus: Secondary | ICD-10-CM

## 2024-02-09 ENCOUNTER — Encounter: Admit: 2024-02-09 | Discharge: 2024-02-09 | Payer: PRIVATE HEALTH INSURANCE

## 2024-02-09 NOTE — Telephone Encounter [36]
 Called patient and informed her of EMB results. Patient verbalized understanding. Denies further questions at this time.

## 2024-02-09 NOTE — Telephone Encounter [36]
-----   Message from CHRISTELLA Pereyra, MD sent at 02/09/2024 12:17 PM CST -----  Please call patient and let her know that biopsy is benign. Our plan of watchful waiting is still good for this fibroid.   ----- Message -----  From: Leverne Malm, Background User  Sent: 02/08/2024   2:16 PM CST  To: Hoy MARLA Pereyra, MD

## 2024-02-10 ENCOUNTER — Encounter: Admit: 2024-02-10 | Discharge: 2024-02-10 | Payer: PRIVATE HEALTH INSURANCE

## 2024-02-10 MED FILL — TACROLIMUS 0.75 MG PO TB24: 0.75 mg | ORAL | 30 days supply | Qty: 30 | Fill #8 | Status: AC

## 2024-02-28 ENCOUNTER — Encounter: Admit: 2024-02-28 | Discharge: 2024-02-28 | Payer: PRIVATE HEALTH INSURANCE

## 2024-02-28 MED FILL — METFORMIN 500 MG PO TB24: 500 mg | ORAL | 90 days supply | Qty: 180 | Fill #2 | Status: AC

## 2024-02-29 ENCOUNTER — Encounter: Admit: 2024-02-29 | Discharge: 2024-02-29 | Payer: PRIVATE HEALTH INSURANCE

## 2024-03-03 ENCOUNTER — Encounter: Admit: 2024-03-03 | Discharge: 2024-03-03 | Payer: PRIVATE HEALTH INSURANCE

## 2024-03-06 ENCOUNTER — Encounter: Admit: 2024-03-06 | Discharge: 2024-03-06 | Payer: PRIVATE HEALTH INSURANCE

## 2024-03-06 MED FILL — TACROLIMUS 0.75 MG PO TB24: 0.75 mg | ORAL | 30 days supply | Qty: 30 | Fill #9 | Status: AC

## 2024-03-10 ENCOUNTER — Encounter: Admit: 2024-03-10 | Discharge: 2024-03-10 | Payer: PRIVATE HEALTH INSURANCE

## 2024-03-17 ENCOUNTER — Encounter: Admit: 2024-03-17 | Discharge: 2024-03-17 | Payer: PRIVATE HEALTH INSURANCE

## 2024-03-17 MED FILL — MYCOPHENOLATE SODIUM 180 MG PO TBEC: 180 mg | ORAL | 60 days supply | Qty: 240 | Fill #4 | Status: AC

## 2024-03-22 ENCOUNTER — Encounter: Admit: 2024-03-22 | Discharge: 2024-03-22 | Payer: PRIVATE HEALTH INSURANCE

## 2024-03-28 ENCOUNTER — Encounter: Admit: 2024-03-28 | Discharge: 2024-03-28 | Payer: PRIVATE HEALTH INSURANCE

## 2024-03-28 MED FILL — EMPAGLIFLOZIN 10 MG PO TAB: 10 mg | ORAL | 90 days supply | Qty: 90 | Fill #2 | Status: AC

## 2024-03-30 ENCOUNTER — Encounter: Admit: 2024-03-30 | Discharge: 2024-03-30 | Payer: PRIVATE HEALTH INSURANCE

## 2024-04-05 ENCOUNTER — Encounter: Admit: 2024-04-05 | Discharge: 2024-04-05 | Payer: PRIVATE HEALTH INSURANCE

## 2024-04-06 ENCOUNTER — Encounter: Admit: 2024-04-06 | Discharge: 2024-04-06 | Payer: PRIVATE HEALTH INSURANCE

## 2024-04-07 ENCOUNTER — Encounter: Admit: 2024-04-07 | Discharge: 2024-04-07 | Payer: PRIVATE HEALTH INSURANCE

## 2024-04-07 MED FILL — ROSUVASTATIN 10 MG PO TAB: 10 mg | ORAL | 90 days supply | Qty: 90 | Fill #3 | Status: AC

## 2024-04-07 MED FILL — TACROLIMUS 0.75 MG PO TB24: 0.75 mg | ORAL | 30 days supply | Qty: 30 | Fill #10 | Status: AC

## 2024-04-27 ENCOUNTER — Encounter: Admit: 2024-04-27 | Discharge: 2024-04-27 | Payer: PRIVATE HEALTH INSURANCE
